# Patient Record
Sex: Female | Born: 1965
Health system: Southern US, Community
[De-identification: ages and names within clinical notes are randomized; demographics above are authoritative.]

## PROBLEM LIST (undated history)

## (undated) DIAGNOSIS — M549 Dorsalgia, unspecified: Secondary | ICD-10-CM

## (undated) DIAGNOSIS — E739 Lactose intolerance, unspecified: Secondary | ICD-10-CM

## (undated) DIAGNOSIS — F329 Major depressive disorder, single episode, unspecified: Secondary | ICD-10-CM

## (undated) DIAGNOSIS — F32A Depression, unspecified: Secondary | ICD-10-CM

## (undated) DIAGNOSIS — K219 Gastro-esophageal reflux disease without esophagitis: Secondary | ICD-10-CM

## (undated) DIAGNOSIS — R5383 Other fatigue: Secondary | ICD-10-CM

## (undated) DIAGNOSIS — F419 Anxiety disorder, unspecified: Secondary | ICD-10-CM

## (undated) HISTORY — DX: Anxiety disorder, unspecified: F41.9

## (undated) HISTORY — DX: Lactose intolerance, unspecified: E73.9

## (undated) HISTORY — DX: Dorsalgia, unspecified: M54.9

## (undated) HISTORY — DX: Major depressive disorder, single episode, unspecified: F32.9

## (undated) HISTORY — DX: Other fatigue: R53.83

## (undated) HISTORY — DX: Depression, unspecified: F32.A

---

## 2002-07-10 ENCOUNTER — Other Ambulatory Visit: Admission: RE | Admit: 2002-07-10 | Discharge: 2002-07-10 | Payer: Self-pay | Admitting: Obstetrics & Gynecology

## 2002-07-11 ENCOUNTER — Other Ambulatory Visit: Admission: RE | Admit: 2002-07-11 | Discharge: 2002-07-11 | Payer: Self-pay | Admitting: Obstetrics & Gynecology

## 2002-07-31 ENCOUNTER — Ambulatory Visit: Admission: AD | Admit: 2002-07-31 | Discharge: 2002-07-31 | Payer: Self-pay | Admitting: Obstetrics and Gynecology

## 2002-07-31 ENCOUNTER — Encounter (INDEPENDENT_AMBULATORY_CARE_PROVIDER_SITE_OTHER): Payer: Self-pay | Admitting: Specialist

## 2003-03-31 HISTORY — PX: DILATION AND CURETTAGE OF UTERUS: SHX78

## 2003-10-22 ENCOUNTER — Other Ambulatory Visit: Admission: RE | Admit: 2003-10-22 | Discharge: 2003-10-22 | Payer: Self-pay | Admitting: Obstetrics and Gynecology

## 2005-03-06 ENCOUNTER — Other Ambulatory Visit: Admission: RE | Admit: 2005-03-06 | Discharge: 2005-03-06 | Payer: Self-pay | Admitting: Obstetrics and Gynecology

## 2007-07-18 ENCOUNTER — Emergency Department (HOSPITAL_COMMUNITY): Admission: EM | Admit: 2007-07-18 | Discharge: 2007-07-19 | Payer: Self-pay | Admitting: Emergency Medicine

## 2010-08-15 NOTE — Op Note (Signed)
   NAME:  Theresa Mathews, Theresa Mathews NO.:  [id number]   MEDICAL RECORD NO.:  [id number]                   PATIENT TYPE:  AMB   LOCATION:  [facility]                                 FACILITY:  [facility]   PHYSICIAN:  Carrington Clamp, M.D.              DATE OF BIRTH:  Aug 28, 1965   DATE OF PROCEDURE:  07/31/2002  DATE OF DISCHARGE:                                 OPERATIVE REPORT   PREOPERATIVE DIAGNOSIS:  Incomplete abortion.   POSTOPERATIVE DIAGNOSIS:  Incomplete abortion.   PROCEDURE:  Suction dilatation and curettage.   ANESTHESIA:  General endotracheal anesthesia.   ESTIMATED BLOOD LOSS:  Minimal.   FLUIDS REPLACED:  750 mL.   URINE OUTPUT:  Not measured.   COMPLICATIONS:  None.   FINDINGS:  Some tissue was passed just before the procedure, and this was  also sent to pathology.  The cervix was open, but the uterus was about six  weeks' size before and after the procedure.  There was good cri at the end  of the procedure.   MEDICATIONS:  Methergine.   PATHOLOGY:  Uterine contents.   COUNTS:  Correct x3.   DESCRIPTION OF PROCEDURE:  After adequate general anesthesia was achieved,  the patient was prepped and draped in the usual sterile fashion in the  dorsal lithotomy position.  During the prep a small amount of tissue had  been passed from the cervix into the vagina, and this was collected and sent  to pathology.  Subsequently suction curettage was performed directly as the  cervix was opened about a centimeter, with scant return of tissue.  The  suction curettage was performed with a 9 mm curette.  Two passes were done,  followed by sharp curettage with good cri, followed by another suction  curettage.  All instruments were withdrawn from the vagina, and the patient  tolerated the procedure well.  Just to indicate, during the prep the bladder  had been drained with a red rubber catheter.                                               Carrington Clamp,  M.D.    MH/MEDQ  D:  07/31/2002  T:  08/01/2002  Job:  [id number]

## 2011-01-30 ENCOUNTER — Encounter: Payer: Self-pay | Admitting: Family Medicine

## 2011-01-30 ENCOUNTER — Ambulatory Visit (INDEPENDENT_AMBULATORY_CARE_PROVIDER_SITE_OTHER): Payer: BC Managed Care – PPO | Admitting: Family Medicine

## 2011-01-30 VITALS — BP 140/100 | HR 85 | Temp 98.7°F | Resp 18 | Ht 64.5 in | Wt 218.0 lb

## 2011-01-30 DIAGNOSIS — Z0000 Encounter for general adult medical examination without abnormal findings: Secondary | ICD-10-CM

## 2011-01-30 NOTE — Patient Instructions (Signed)

## 2011-01-30 NOTE — Progress Notes (Signed)
  Subjective:     Theresa Mathews is a 45 y.o. female and is here for a comprehensive physical exam. The patient reports no problems.  History   Social History  . Marital Status: Married    Spouse Name: N/A    Number of Children: N/A  . Years of Education: N/A   Occupational History  . Not on file.   Social History Main Topics  . Smoking status: Never Smoker   . Smokeless tobacco: Not on file  . Alcohol Use: Yes  . Drug Use: No  . Sexually Active: Not on file   Other Topics Concern  . Not on file   Social History Narrative  . No narrative on file   Health Maintenance  Topic Date Due  . Pap Smear  04/17/1984  . Tetanus/tdap  04/17/1985  . Influenza Vaccine  12/29/2010    The following portions of the patient's history were reviewed and updated as appropriate: allergies, current medications, past family history, past medical history, past social history, past surgical history and problem list.  Review of Systems Review of Systems  Constitutional: Negative for activity change, appetite change and fatigue.  HENT: Negative for hearing loss, congestion, tinnitus and ear discharge.  dentist q53m Eyes: Negative for visual disturbance (see optho q1y -- vision corrected to 20/20 with glasses).  Respiratory: Negative for cough, chest tightness and shortness of breath.   Cardiovascular: Negative for chest pain, palpitations and leg swelling.  Gastrointestinal: Negative for abdominal pain, diarrhea, constipation and abdominal distention.  Genitourinary: Negative for urgency, frequency, decreased urine volume and difficulty urinating.  Musculoskeletal: Negative for back pain, arthralgias and gait problem.  Skin: Negative for color change, pallor and rash.  Neurological: Negative for dizziness, light-headedness, numbness and headaches.  Hematological: Negative for adenopathy. Does not bruise/bleed easily.  Psychiatric/Behavioral: Negative for suicidal ideas, confusion, sleep  disturbance, self-injury, dysphoric mood, decreased concentration and agitation.  Derm--      Objective:    BP 140/100  Pulse 85  Temp(Src) 98.7 F (37.1 C) (Oral)  Resp 18  Ht 5' 4.5" (1.638 m)  Wt 218 lb (98.884 kg)  BMI 36.84 kg/m2  SpO2 95% General appearance: alert, cooperative, appears stated age and no distress Head: Normocephalic, without obvious abnormality, atraumatic Eyes: conjunctivae/corneas clear. PERRL, EOM's intact. Fundi benign. Ears: normal TM's and external ear canals both ears Nose: Nares normal. Septum midline. Mucosa normal. No drainage or sinus tenderness. Throat: lips, mucosa, and tongue normal; teeth and gums normal Neck: no adenopathy, no carotid bruit, no JVD, supple, symmetrical, trachea midline and thyroid not enlarged, symmetric, no tenderness/mass/nodules Back: symmetric, no curvature. ROM normal. No CVA tenderness. Lungs: clear to auscultation bilaterally Breasts: gyn Heart: regular rate and rhythm, S1, S2 normal, no murmur, click, rub or gallop Abdomen: soft, non-tender; bowel sounds normal; no masses,  no organomegaly Pelvic: gyn Extremities: extremities normal, atraumatic, no cyanosis or edema Pulses: 2+ and symmetric Skin: Skin color, texture, turgor normal. No rashes or lesions Lymph nodes: Cervical, supraclavicular, and axillary nodes normal. Neurologic: Alert and oriented X 3, normal strength and tone. Normal symmetric reflexes. Normal coordination and gait psych--no depression, no anxiety    Assessment:    Healthy female exam. obesity   Plan:    ghm utd D/w pt diet and exercise See After Visit Summary for Counseling Recommendations

## 2011-02-05 ENCOUNTER — Other Ambulatory Visit (INDEPENDENT_AMBULATORY_CARE_PROVIDER_SITE_OTHER): Payer: BC Managed Care – PPO

## 2011-02-05 DIAGNOSIS — Z0000 Encounter for general adult medical examination without abnormal findings: Secondary | ICD-10-CM

## 2011-02-05 LAB — CBC WITH DIFFERENTIAL/PLATELET
Basophils Relative: 0.4 % (ref 0.0–3.0)
Eosinophils Absolute: 0.2 10*3/uL (ref 0.0–0.7)
Eosinophils Relative: 2.7 % (ref 0.0–5.0)
Lymphocytes Relative: 29.1 % (ref 12.0–46.0)
MCHC: 33.8 g/dL (ref 30.0–36.0)
MCV: 92.9 fl (ref 78.0–100.0)
Monocytes Absolute: 0.5 10*3/uL (ref 0.1–1.0)
Neutrophils Relative %: 62.7 % (ref 43.0–77.0)
Platelets: 317 10*3/uL (ref 150.0–400.0)
RBC: 4.27 Mil/uL (ref 3.87–5.11)
WBC: 8.9 10*3/uL (ref 4.5–10.5)

## 2011-02-05 LAB — BASIC METABOLIC PANEL
BUN: 13 mg/dL (ref 6–23)
Calcium: 9 mg/dL (ref 8.4–10.5)
Creatinine, Ser: 0.8 mg/dL (ref 0.4–1.2)

## 2011-02-05 LAB — HEPATIC FUNCTION PANEL
AST: 20 U/L (ref 0–37)
Albumin: 4.2 g/dL (ref 3.5–5.2)
Alkaline Phosphatase: 81 U/L (ref 39–117)
Bilirubin, Direct: 0 mg/dL (ref 0.0–0.3)
Total Protein: 7.6 g/dL (ref 6.0–8.3)

## 2011-02-05 LAB — LIPID PANEL
Cholesterol: 185 mg/dL (ref 0–200)
Triglycerides: 135 mg/dL (ref 0.0–149.0)

## 2011-02-05 LAB — TSH: TSH: 1.27 u[IU]/mL (ref 0.35–5.50)

## 2011-02-05 NOTE — Progress Notes (Signed)
12  

## 2011-12-08 ENCOUNTER — Other Ambulatory Visit: Payer: Self-pay | Admitting: Obstetrics and Gynecology

## 2012-06-28 ENCOUNTER — Other Ambulatory Visit: Payer: Self-pay | Admitting: Dermatology

## 2012-12-01 ENCOUNTER — Ambulatory Visit (INDEPENDENT_AMBULATORY_CARE_PROVIDER_SITE_OTHER): Payer: BC Managed Care – PPO | Admitting: Family Medicine

## 2012-12-01 ENCOUNTER — Encounter: Payer: Self-pay | Admitting: Family Medicine

## 2012-12-01 VITALS — BP 116/80 | HR 81 | Temp 98.9°F | Wt 214.6 lb

## 2012-12-01 DIAGNOSIS — M545 Low back pain, unspecified: Secondary | ICD-10-CM | POA: Insufficient documentation

## 2012-12-01 DIAGNOSIS — M549 Dorsalgia, unspecified: Secondary | ICD-10-CM

## 2012-12-01 MED ORDER — CYCLOBENZAPRINE HCL 10 MG PO TABS: 10.0000 mg | ORAL_TABLET | Freq: Three times a day (TID) | ORAL | Status: DC | PRN

## 2012-12-01 MED ORDER — MELOXICAM 15 MG PO TABS: 15.0000 mg | ORAL_TABLET | Freq: Every day | ORAL | Status: DC

## 2012-12-01 NOTE — Progress Notes (Signed)
  Subjective:     Theresa Mathews is a 47 y.o. female who presents for evaluation of neck pain. Event that precipitated these symptoms: thinks she just slept funny. Onset of symptoms was 2 weeks ago, and have been gradually improving since that time. Current symptoms are stiffness in L side neck  and pain in low back with no radiation. Patient denies weakness in arms or legs. Patient has had no prior neck problems or back problems.   Previous treatments: medication: NSAID: Ibuprofen.  The following portions of the patient's history were reviewed and updated as appropriate: allergies, current medications, past family history, past medical history, past social history, past surgical history and problem list.  Review of Systems Pertinent items are noted in HPI.    Objective:    BP 116/80  Pulse 81  Temp(Src) 98.9 F (37.2 C) (Oral)  Wt 214 lb 9.6 oz (97.342 kg)  BMI 36.28 kg/m2  SpO2 97% General:   alert, cooperative, appears stated age and no distress  External Deformity:  absent  ROM Cervical Spine:  left rotation to 45 degrees,  Rest of rom normal  Midline Tenderness:  absent -  Paraspinous tenderness:  moderate on the left  UE Neurologic Exam ::  unremarkable, normal strength, normal sensation, normal reflexes       Back---dtr = and b/l  ,  SLR normal,  from    X-ray of the cervical spine: Not indicated    Assessment:    Cervical strain    Plan:    Discussed the cervical pain, its course and treatment. Discussed appropriate exercises. Discussed appropriate use of ice and heat. NSAIDs per medication orders. Muscle relaxants started per medication orders. Suggested chiropractic. f/u prn

## 2012-12-01 NOTE — Assessment & Plan Note (Signed)
Exercise Muscle relaxer mobic Ice/ heat Consider chiropractor

## 2012-12-01 NOTE — Patient Instructions (Addendum)
Lumbosacral Strain Lumbosacral strain is one of the most common causes of back pain. There are many causes of back pain. Most are not serious conditions. CAUSES  Your backbone (spinal column) is made up of 24 main vertebral bodies, the sacrum, and the coccyx. These are held together by muscles and tough, fibrous tissue (ligaments). Nerve roots pass through the openings between the vertebrae. A sudden move or injury to the back may cause injury to, or pressure on, these nerves. This may result in localized back pain or pain movement (radiation) into the buttocks, down the leg, and into the foot. Sharp, shooting pain from the buttock down the back of the leg (sciatica) is frequently associated with a ruptured (herniated) disk. Pain may be caused by muscle spasm alone. Your caregiver can often find the cause of your pain by the details of your symptoms and an exam. In some cases, you may need tests (such as X-rays). Your caregiver will work with you to decide if any tests are needed based on your specific exam. HOME CARE INSTRUCTIONS   Avoid an underactive lifestyle. Active exercise, as directed by your caregiver, is your greatest weapon against back pain.  Avoid hard physical activities (tennis, racquetball, waterskiing) if you are not in proper physical condition for it. This may aggravate or create problems.  If you have a back problem, avoid sports requiring sudden body movements. Swimming and walking are generally safer activities.  Maintain good posture.  Avoid becoming overweight (obese).  Use bed rest for only the most extreme, sudden (acute) episode. Your caregiver will help you determine how much bed rest is necessary.  For acute conditions, you may put ice on the injured area.  Put ice in a plastic bag.  Place a towel between your skin and the bag.  Leave the ice on for 15-20 minutes at a time, every 2 hours, or as needed.  After you are improved and more active, it may help to  apply heat for 30 minutes before activities. See your caregiver if you are having pain that lasts longer than expected. Your caregiver can advise appropriate exercises or therapy if needed. With conditioning, most back problems can be avoided. SEEK IMMEDIATE MEDICAL CARE IF:   You have numbness, tingling, weakness, or problems with the use of your arms or legs.  You experience severe back pain not relieved with medicines.  There is a change in bowel or bladder control.  You have increasing pain in any area of the body, including your belly (abdomen).  You notice shortness of breath, dizziness, or feel faint.  You feel sick to your stomach (nauseous), are throwing up (vomiting), or become sweaty.  You notice discoloration of your toes or legs, or your feet get very cold.  Your back pain is getting worse.  You have a fever. MAKE SURE YOU:   Understand these instructions.  Will watch your condition.  Will get help right away if you are not doing well or get worse. Document Released: 12/24/2004 Document Revised: 06/08/2011 Document Reviewed: 06/15/2008 Exit[employer] Patient Information 2014 [employer]. Cervical Sprain A cervical sprain is an injury in the neck in which the ligaments are stretched or torn. The ligaments are the tissues that hold the bones of the neck (vertebrae) in place.Cervical sprains can range from very mild to very severe. Most cervical sprains get better in 1 to 3 weeks, but it depends on the cause and extent of the injury. Severe cervical sprains can cause the neck vertebrae   to be unstable. This can lead to damage of the spinal cord and can result in serious nervous system problems. Your caregiver will determine whether your cervical sprain is mild or severe. CAUSES  Severe cervical sprains may be caused by:  Contact sport injuries (football, rugby, wrestling, hockey, auto racing, gymnastics, diving, martial arts, boxing).  Motor vehicle  collisions.  Whiplash injuries. This means the neck is forcefully whipped backward and forward.  Falls. Mild cervical sprains may be caused by:   Awkward positions, such as cradling a telephone between your ear and shoulder.  Sitting in a chair that does not offer proper support.  Working at a poorly designed computer station.  Activities that require looking up or down for long periods of time. SYMPTOMS   Pain, soreness, stiffness, or a burning sensation in the front, back, or sides of the neck. This discomfort may develop immediately after injury or it may develop slowly and not begin for 24 hours or more after an injury.  Pain or tenderness directly in the middle of the back of the neck.  Shoulder or upper back pain.  Limited ability to move the neck.  Headache.  Dizziness.  Weakness, numbness, or tingling in the hands or arms.  Muscle spasms.  Difficulty swallowing or chewing.  Tenderness and swelling of the neck. DIAGNOSIS  Most of the time, your caregiver can diagnose this problem by taking your history and doing a physical exam. Your caregiver will ask about any known problems, such as arthritis in the neck or a previous neck injury. X-rays may be taken to find out if there are any other problems, such as problems with the bones of the neck. However, an X-ray often does not reveal the full extent of a cervical sprain. Other tests such as a computed tomography (CT) scan or magnetic resonance imaging (MRI) may be needed. TREATMENT  Treatment depends on the severity of the cervical sprain. Mild sprains can be treated with rest, keeping the neck in place (immobilization), and pain medicines. Severe cervical sprains need immediate immobilization and an appointment with an orthopedist or neurosurgeon. Several treatment options are available to help with pain, muscle spasms, and other symptoms. Your caregiver may prescribe:  Medicines, such as pain relievers, numbing  medicines, or muscle relaxants.  Physical therapy. This can include stretching exercises, strengthening exercises, and posture training. Exercises and improved posture can help stabilize the neck, strengthen muscles, and help stop symptoms from returning.  A neck collar to be worn for short periods of time. Often, these collars are worn for comfort. However, certain collars may be worn to protect the neck and prevent further worsening of a serious cervical sprain. HOME CARE INSTRUCTIONS   Put ice on the injured area.  Put ice in a plastic bag.  Place a towel between your skin and the bag.  Leave the ice on for 15-20 minutes, 3-4 times a day.  Only take over-the-counter or prescription medicines for pain, discomfort, or fever as directed by your caregiver.  Keep all follow-up appointments as directed by your caregiver.  Keep all physical therapy appointments as directed by your caregiver.  If a neck collar is prescribed, wear it as directed by your caregiver.  Do not drive while wearing a neck collar.  Make any needed adjustments to your work station to promote good posture.  Avoid positions and activities that make your symptoms worse.  Warm up and stretch before being active to help prevent problems. SEEK MEDICAL CARE IF:     Your pain is not controlled with medicine.  You are unable to decrease your pain medicine over time as planned.  Your activity level is not improving as expected. SEEK IMMEDIATE MEDICAL CARE IF:   You develop any bleeding, stomach upset, or signs of an allergic reaction to your medicine.  Your symptoms get worse.  You develop new, unexplained symptoms.  You have numbness, tingling, weakness, or paralysis in any part of your body. MAKE SURE YOU:   Understand these instructions.  Will watch your condition.  Will get help right away if you are not doing well or get worse. Document Released: 01/11/2007 Document Revised: 06/08/2011 Document  Reviewed: 12/17/2010 [employer] Patient Information 2014 [employer].  

## 2013-06-28 ENCOUNTER — Other Ambulatory Visit: Payer: Self-pay | Admitting: Obstetrics and Gynecology

## 2014-08-09 ENCOUNTER — Other Ambulatory Visit: Payer: Self-pay | Admitting: Obstetrics and Gynecology

## 2014-08-09 LAB — HM MAMMOGRAPHY

## 2014-08-09 LAB — HM PAP SMEAR: HM Pap smear: NORMAL

## 2014-08-10 LAB — CYTOLOGY - PAP

## 2014-09-14 ENCOUNTER — Encounter: Payer: Self-pay | Admitting: General Practice

## 2014-10-18 ENCOUNTER — Other Ambulatory Visit: Payer: Self-pay | Admitting: Obstetrics and Gynecology

## 2014-11-13 ENCOUNTER — Encounter: Payer: Self-pay | Admitting: Family Medicine

## 2015-01-08 ENCOUNTER — Encounter (HOSPITAL_COMMUNITY): Payer: Self-pay

## 2015-01-08 ENCOUNTER — Encounter (HOSPITAL_COMMUNITY)
Admission: RE | Admit: 2015-01-08 | Discharge: 2015-01-08 | Disposition: A | Discharge status: Home / Self Care | Payer: BLUE CROSS/BLUE SHIELD | Source: Ambulatory Visit | Attending: Obstetrics and Gynecology | Admitting: Obstetrics and Gynecology

## 2015-01-08 DIAGNOSIS — Z01818 Encounter for other preprocedural examination: Secondary | ICD-10-CM | POA: Diagnosis not present

## 2015-01-08 DIAGNOSIS — R938 Abnormal findings on diagnostic imaging of other specified body structures: Secondary | ICD-10-CM | POA: Diagnosis not present

## 2015-01-08 DIAGNOSIS — N393 Stress incontinence (female) (male): Secondary | ICD-10-CM | POA: Insufficient documentation

## 2015-01-08 HISTORY — DX: Gastro-esophageal reflux disease without esophagitis: K21.9

## 2015-01-08 LAB — CBC
HCT: 39.7 % (ref 36.0–46.0)
HEMOGLOBIN: 13.6 g/dL (ref 12.0–15.0)
MCH: 31 pg (ref 26.0–34.0)
MCHC: 34.3 g/dL (ref 30.0–36.0)
MCV: 90.4 fL (ref 78.0–100.0)
PLATELETS: 391 10*3/uL (ref 150–400)
RBC: 4.39 MIL/uL (ref 3.87–5.11)
RDW: 13.4 % (ref 11.5–15.5)
WBC: 9.5 10*3/uL (ref 4.0–10.5)

## 2015-01-08 NOTE — Patient Instructions (Addendum)
   Your procedure is scheduled on: OCT 26 AT 730AM  Enter through the Main Entrance of [facility] at: 6AM  Pick up the phone at the desk and dial [phone] and inform us of your arrival.  Please call this number if you have any problems the morning of surgery: [phone]  DO NOT EAT OR DRINK AFTER MIDNIGHT OCT 25   Take these medicines the morning of surgery with a SIP OF WATER:  TAKE ZANTAC NIGHT BEFORE SURGERY  Do not wear jewelry, make-up, or FINGER nail polish No metal in your hair or on your body. Do not wear lotions, powders, perfumes.  You may wear deodorant.  Do not bring valuables to the hospital. Contacts, dentures or bridgework may not be worn into surgery.  Leave suitcase in the car. After Surgery it may be brought to your room. For patients being admitted to the hospital, checkout time is 11:00am the day of discharge.

## 2015-01-10 ENCOUNTER — Other Ambulatory Visit: Payer: Self-pay | Admitting: Obstetrics and Gynecology

## 2015-01-22 ENCOUNTER — Encounter (HOSPITAL_COMMUNITY): Payer: Self-pay | Admitting: Anesthesiology

## 2015-01-22 MED ORDER — DEXTROSE 5 % IV SOLN
3.0000 g | INTRAVENOUS | Status: AC
  Administered 2015-01-23: 3 g via INTRAVENOUS
  Filled 2015-01-22: qty 3000

## 2015-01-22 NOTE — H&P (Signed)
49 y.o. yo complains of SUI severe with cystocele and suspended uterus. Doing TLH for thickened endometrium, menorrhagia/dysmenorrhea and family hx of ovarian cancer. Pt ok with HRT and menopause with removal of ovaries. No splinting or bm getting caught.SUI is very significant and desires to have out. No SA secondary husband has MS. EM increased from 1.3 to 2 cm in last month. No problems with periods except slightly more cramping.  Past Medical History  Diagnosis Date  . Depression   . GERD (gastroesophageal reflux disease)    Past Surgical History  Procedure Laterality Date  . Dilation and curettage of uterus      Social History   Social History  . Marital Status: Married    Spouse Name: N/A  . Number of Children: N/A  . Years of Education: N/A   Occupational History  . [employer]    Social History Main Topics  . Smoking status: Never Smoker   . Smokeless tobacco: Not on file  . Alcohol Use: 1.2 oz/week    2 Glasses of wine per week     Comment: 2-3  . Drug Use: No  . Sexual Activity:    Partners: Male   Other Topics Concern  . Not on file   Social History Narrative   Exercise--no    No current facility-administered medications on file prior to encounter.   Current Outpatient Prescriptions on File Prior to Encounter  Medication Sig Dispense Refill  . citalopram (CELEXA) 10 MG tablet Take 1 tablet by mouth daily.      No Known Allergies  @VITALS2 @  Lungs: clear to ascultation Cor:  RRR Abdomen:  soft, nontender, nondistended. Ex:  no  Pelvic: Vulva: no masses, atrophy, or lesions. Bladder/Urethra: no urethral discharge or mass and normal meatus and bladder non distended; urethra >>45 degrees, leaking with valsalva. Vagina no tenderness, erythema, rectocele, abnormal vaginal discharge, or vesicle(s) or ulcers and cystocele (to 0). Cervix: no discharge or cervical motion tenderness and grossly normal. Uterus: normal shape and size (7, normal size  shape and scant decensus) and midline, mobile, non-tender, and no uterine prolapse. Adnexa/Parametria: no parametrial tenderness or mass and no adnexal tenderness or ovarian mass.cords, erythema    A:  Severe SUI with >>45 degree urethra.  Pt has thickened endometrium (about 2 cm) with a normal EMB.  Pt also has family hx of ovarian cancer.  After discussing with pt all options she strongly desires to have a TLH/BSO with TVT/A repair and cystoscopy.   P: All risks, benefits and alternatives d/w patient and she desires to proceed.  Patient has undergone a modified bowel prep and will receive preop antibiotics and SCDs during the operation.     Roy Tokarz A

## 2015-01-22 NOTE — Anesthesia Preprocedure Evaluation (Addendum)
Anesthesia Evaluation  Patient identified by MRN, date of birth, ID band Patient awake    Reviewed: Allergy & Precautions, NPO status , Patient's Chart, lab work & pertinent test results  Airway Mallampati: II  TM Distance: >3 FB Neck ROM: Full    Dental  (+) Teeth Intact, Caps   Pulmonary neg pulmonary ROS,    Pulmonary exam normal breath sounds clear to auscultation       Cardiovascular Exercise Tolerance: Good negative cardio ROS Normal cardiovascular exam Rhythm:Regular Rate:Normal     Neuro/Psych PSYCHIATRIC DISORDERS Depression negative neurological ROS     GI/Hepatic Neg liver ROS, GERD  Medicated and Controlled,  Endo/Other  Morbid obesity  Renal/GU negative Renal ROS Bladder dysfunction  SUI    Musculoskeletal negative musculoskeletal ROS (+)   Abdominal (+) + obese,   Peds  Hematology   Anesthesia Other Findings   Reproductive/Obstetrics Thickened endometrium                            Anesthesia Physical Anesthesia Plan  ASA: III  Anesthesia Plan: General   Post-op Pain Management:    Induction: Intravenous  Airway Management Planned: Oral ETT  Additional Equipment:   Intra-op Plan:   Post-operative Plan: Extubation in OR  Informed Consent: I have reviewed the patients History and Physical, chart, labs and discussed the procedure including the risks, benefits and alternatives for the proposed anesthesia with the patient or authorized representative who has indicated his/her understanding and acceptance.   Dental advisory given  Plan Discussed with: CRNA, Anesthesiologist and Surgeon  Anesthesia Plan Comments:         Anesthesia Quick Evaluation

## 2015-01-23 ENCOUNTER — Encounter (HOSPITAL_COMMUNITY): Payer: Self-pay | Admitting: Certified Registered Nurse Anesthetist

## 2015-01-23 ENCOUNTER — Encounter (HOSPITAL_COMMUNITY)
Admission: RE | Disposition: A | Discharge status: Home / Self Care | Payer: Self-pay | Source: Ambulatory Visit | Attending: Obstetrics and Gynecology

## 2015-01-23 ENCOUNTER — Ambulatory Visit (HOSPITAL_COMMUNITY): Payer: BLUE CROSS/BLUE SHIELD | Admitting: Anesthesiology

## 2015-01-23 ENCOUNTER — Ambulatory Visit (HOSPITAL_COMMUNITY)
Admission: RE | Admit: 2015-01-23 | Discharge: 2015-01-24 | Disposition: A | Discharge status: Home / Self Care | Payer: BLUE CROSS/BLUE SHIELD | Source: Ambulatory Visit | Attending: Obstetrics and Gynecology | Admitting: Obstetrics and Gynecology

## 2015-01-23 DIAGNOSIS — N393 Stress incontinence (female) (male): Secondary | ICD-10-CM | POA: Insufficient documentation

## 2015-01-23 DIAGNOSIS — Z9889 Other specified postprocedural states: Secondary | ICD-10-CM

## 2015-01-23 DIAGNOSIS — Z6841 Body Mass Index (BMI) 40.0 and over, adult: Secondary | ICD-10-CM | POA: Diagnosis not present

## 2015-01-23 DIAGNOSIS — N814 Uterovaginal prolapse, unspecified: Secondary | ICD-10-CM | POA: Diagnosis not present

## 2015-01-23 DIAGNOSIS — N920 Excessive and frequent menstruation with regular cycle: Secondary | ICD-10-CM | POA: Diagnosis not present

## 2015-01-23 DIAGNOSIS — K219 Gastro-esophageal reflux disease without esophagitis: Secondary | ICD-10-CM | POA: Insufficient documentation

## 2015-01-23 DIAGNOSIS — Z8041 Family history of malignant neoplasm of ovary: Secondary | ICD-10-CM | POA: Insufficient documentation

## 2015-01-23 DIAGNOSIS — D259 Leiomyoma of uterus, unspecified: Secondary | ICD-10-CM | POA: Diagnosis present

## 2015-01-23 DIAGNOSIS — R938 Abnormal findings on diagnostic imaging of other specified body structures: Secondary | ICD-10-CM | POA: Diagnosis not present

## 2015-01-23 DIAGNOSIS — N946 Dysmenorrhea, unspecified: Secondary | ICD-10-CM | POA: Insufficient documentation

## 2015-01-23 HISTORY — PX: CYSTOSCOPY: SHX5120

## 2015-01-23 HISTORY — PX: ROBOTIC ASSISTED TOTAL HYSTERECTOMY WITH BILATERAL SALPINGO OOPHERECTOMY: SHX6086

## 2015-01-23 HISTORY — PX: BLADDER SUSPENSION: SHX72

## 2015-01-23 LAB — PREGNANCY, URINE: PREG TEST UR: NEGATIVE

## 2015-01-23 SURGERY — ROBOTIC ASSISTED TOTAL HYSTERECTOMY WITH BILATERAL SALPINGO OOPHORECTOMY
Anesthesia: General | Site: Vagina

## 2015-01-23 MED ORDER — MIDAZOLAM HCL 2 MG/2ML IJ SOLN
INTRAMUSCULAR | Status: DC | PRN
  Administered 2015-01-23: 2 mg via INTRAVENOUS

## 2015-01-23 MED ORDER — SCOPOLAMINE 1 MG/3DAYS TD PT72
1.0000 | MEDICATED_PATCH | Freq: Once | TRANSDERMAL | Status: DC
  Administered 2015-01-23: 1.5 mg via TRANSDERMAL

## 2015-01-23 MED ORDER — ESTRADIOL 0.1 MG/GM VA CREA
TOPICAL_CREAM | VAGINAL | Status: AC
  Filled 2015-01-23: qty 42.5

## 2015-01-23 MED ORDER — ARTIFICIAL TEARS OP OINT
TOPICAL_OINTMENT | OPHTHALMIC | Status: AC
  Filled 2015-01-23: qty 3.5

## 2015-01-23 MED ORDER — KETOROLAC TROMETHAMINE 30 MG/ML IJ SOLN
INTRAMUSCULAR | Status: DC | PRN
  Administered 2015-01-23: 30 mg via INTRAVENOUS

## 2015-01-23 MED ORDER — FENTANYL CITRATE (PF) 250 MCG/5ML IJ SOLN
INTRAMUSCULAR | Status: AC
  Filled 2015-01-23: qty 25

## 2015-01-23 MED ORDER — DEXAMETHASONE SODIUM PHOSPHATE 10 MG/ML IJ SOLN
INTRAMUSCULAR | Status: DC | PRN
  Administered 2015-01-23: 4 mg via INTRAVENOUS

## 2015-01-23 MED ORDER — FENTANYL CITRATE (PF) 100 MCG/2ML IJ SOLN
INTRAMUSCULAR | Status: DC | PRN
  Administered 2015-01-23: 100 ug via INTRAVENOUS
  Administered 2015-01-23: 25 ug via INTRAVENOUS
  Administered 2015-01-23: 50 ug via INTRAVENOUS
  Administered 2015-01-23: 25 ug via INTRAVENOUS
  Administered 2015-01-23: 100 ug via INTRAVENOUS
  Administered 2015-01-23: 50 ug via INTRAVENOUS

## 2015-01-23 MED ORDER — KETOROLAC TROMETHAMINE 30 MG/ML IJ SOLN
INTRAMUSCULAR | Status: AC
  Filled 2015-01-23: qty 1

## 2015-01-23 MED ORDER — ONDANSETRON HCL 4 MG/2ML IJ SOLN: 4.0000 mg | Freq: Four times a day (QID) | INTRAMUSCULAR | Status: DC | PRN

## 2015-01-23 MED ORDER — MENTHOL 3 MG MT LOZG: 1.0000 | LOZENGE | OROMUCOSAL | Status: DC | PRN

## 2015-01-23 MED ORDER — LIDOCAINE-EPINEPHRINE 0.5 %-1:200000 IJ SOLN
INTRAMUSCULAR | Status: DC | PRN
  Administered 2015-01-23: 4 mL

## 2015-01-23 MED ORDER — SODIUM CHLORIDE 0.9 % IJ SOLN
INTRAMUSCULAR | Status: AC
  Filled 2015-01-23: qty 50

## 2015-01-23 MED ORDER — NEOSTIGMINE METHYLSULFATE 10 MG/10ML IV SOLN
INTRAVENOUS | Status: AC
  Filled 2015-01-23: qty 1

## 2015-01-23 MED ORDER — GLYCOPYRROLATE 0.2 MG/ML IJ SOLN
INTRAMUSCULAR | Status: AC
  Filled 2015-01-23: qty 3

## 2015-01-23 MED ORDER — PROPOFOL 10 MG/ML IV BOLUS
INTRAVENOUS | Status: DC | PRN
  Administered 2015-01-23: 180 mg via INTRAVENOUS

## 2015-01-23 MED ORDER — ROCURONIUM BROMIDE 100 MG/10ML IV SOLN
INTRAVENOUS | Status: AC
  Filled 2015-01-23: qty 1

## 2015-01-23 MED ORDER — HYDROMORPHONE HCL 1 MG/ML IJ SOLN
INTRAMUSCULAR | Status: AC
  Administered 2015-01-23: 0.5 mg via INTRAVENOUS
  Filled 2015-01-23: qty 1

## 2015-01-23 MED ORDER — FAMOTIDINE 20 MG PO TABS
20.0000 mg | ORAL_TABLET | Freq: Two times a day (BID) | ORAL | Status: DC
  Administered 2015-01-23 – 2015-01-24 (×2): 20 mg via ORAL
  Filled 2015-01-23 (×2): qty 1

## 2015-01-23 MED ORDER — PHENYLEPHRINE HCL 10 MG/ML IJ SOLN
INTRAMUSCULAR | Status: DC | PRN
  Administered 2015-01-23: 40 ug via INTRAVENOUS

## 2015-01-23 MED ORDER — METOCLOPRAMIDE HCL 5 MG/ML IJ SOLN: 10.0000 mg | Freq: Once | INTRAMUSCULAR | Status: DC | PRN

## 2015-01-23 MED ORDER — ROPIVACAINE HCL 5 MG/ML IJ SOLN
INTRAMUSCULAR | Status: DC | PRN
  Administered 2015-01-23: 60 mL

## 2015-01-23 MED ORDER — NEOSTIGMINE METHYLSULFATE 10 MG/10ML IV SOLN
INTRAVENOUS | Status: DC | PRN
  Administered 2015-01-23: 2 mg via INTRAVENOUS

## 2015-01-23 MED ORDER — CITALOPRAM HYDROBROMIDE 10 MG PO TABS
10.0000 mg | ORAL_TABLET | Freq: Every day | ORAL | Status: DC
  Administered 2015-01-24: 10 mg via ORAL
  Filled 2015-01-23 (×2): qty 1

## 2015-01-23 MED ORDER — STERILE WATER FOR IRRIGATION IR SOLN
Status: DC | PRN
  Administered 2015-01-23 (×2): 1000 mL via INTRAVESICAL

## 2015-01-23 MED ORDER — LACTATED RINGERS IV SOLN
INTRAVENOUS | Status: DC
  Administered 2015-01-23 (×2): via INTRAVENOUS

## 2015-01-23 MED ORDER — ONDANSETRON HCL 4 MG/2ML IJ SOLN
INTRAMUSCULAR | Status: DC | PRN
  Administered 2015-01-23: 4 mg via INTRAVENOUS

## 2015-01-23 MED ORDER — GLYCOPYRROLATE 0.2 MG/ML IJ SOLN
INTRAMUSCULAR | Status: DC | PRN
  Administered 2015-01-23: 0.3 mg via INTRAVENOUS

## 2015-01-23 MED ORDER — MEPERIDINE HCL 25 MG/ML IJ SOLN: 6.2500 mg | INTRAMUSCULAR | Status: DC | PRN

## 2015-01-23 MED ORDER — LIDOCAINE HCL (CARDIAC) 20 MG/ML IV SOLN
INTRAVENOUS | Status: DC | PRN
  Administered 2015-01-23: 80 mg via INTRAVENOUS

## 2015-01-23 MED ORDER — LIDOCAINE HCL (CARDIAC) 20 MG/ML IV SOLN
INTRAVENOUS | Status: AC
  Filled 2015-01-23: qty 5

## 2015-01-23 MED ORDER — METHYLENE BLUE 1 % INJ SOLN
INTRAMUSCULAR | Status: AC
  Filled 2015-01-23: qty 1

## 2015-01-23 MED ORDER — ROCURONIUM BROMIDE 100 MG/10ML IV SOLN
INTRAVENOUS | Status: DC | PRN
  Administered 2015-01-23: 60 mg via INTRAVENOUS
  Administered 2015-01-23: 10 mg via INTRAVENOUS

## 2015-01-23 MED ORDER — HYDROMORPHONE HCL 1 MG/ML IJ SOLN
0.2500 mg | INTRAMUSCULAR | Status: DC | PRN
  Administered 2015-01-23 (×2): 0.5 mg via INTRAVENOUS

## 2015-01-23 MED ORDER — ONDANSETRON HCL 4 MG PO TABS: 4.0000 mg | ORAL_TABLET | Freq: Four times a day (QID) | ORAL | Status: DC | PRN

## 2015-01-23 MED ORDER — LACTATED RINGERS IR SOLN
Status: DC | PRN
Start: 2015-01-23 — End: 2015-01-23
  Administered 2015-01-23: 3000 mL

## 2015-01-23 MED ORDER — SCOPOLAMINE 1 MG/3DAYS TD PT72
MEDICATED_PATCH | TRANSDERMAL | Status: AC
  Administered 2015-01-23: 1.5 mg via TRANSDERMAL
  Filled 2015-01-23: qty 1

## 2015-01-23 MED ORDER — OXYCODONE-ACETAMINOPHEN 5-325 MG PO TABS
1.0000 | ORAL_TABLET | ORAL | Status: DC | PRN
  Administered 2015-01-23 – 2015-01-24 (×2): 1 via ORAL
  Filled 2015-01-23 (×2): qty 1

## 2015-01-23 MED ORDER — ARTIFICIAL TEARS OP OINT
TOPICAL_OINTMENT | OPHTHALMIC | Status: DC | PRN
  Administered 2015-01-23: 1 via OPHTHALMIC

## 2015-01-23 MED ORDER — KETOROLAC TROMETHAMINE 30 MG/ML IJ SOLN: 30.0000 mg | Freq: Four times a day (QID) | INTRAMUSCULAR | Status: DC

## 2015-01-23 MED ORDER — DEXAMETHASONE SODIUM PHOSPHATE 4 MG/ML IJ SOLN
INTRAMUSCULAR | Status: AC
  Filled 2015-01-23: qty 1

## 2015-01-23 MED ORDER — ROPIVACAINE HCL 5 MG/ML IJ SOLN
INTRAMUSCULAR | Status: AC
  Filled 2015-01-23: qty 30

## 2015-01-23 MED ORDER — LIDOCAINE-EPINEPHRINE 0.5 %-1:200000 IJ SOLN
INTRAMUSCULAR | Status: AC
Start: 2015-01-23 — End: 2015-01-23
  Filled 2015-01-23: qty 1

## 2015-01-23 MED ORDER — MIDAZOLAM HCL 2 MG/2ML IJ SOLN
INTRAMUSCULAR | Status: AC
  Filled 2015-01-23: qty 4

## 2015-01-23 MED ORDER — ONDANSETRON HCL 4 MG/2ML IJ SOLN
INTRAMUSCULAR | Status: AC
  Filled 2015-01-23: qty 2

## 2015-01-23 MED ORDER — IBUPROFEN 800 MG PO TABS
800.0000 mg | ORAL_TABLET | Freq: Three times a day (TID) | ORAL | Status: DC | PRN
  Administered 2015-01-23 – 2015-01-24 (×2): 800 mg via ORAL
  Filled 2015-01-23 (×2): qty 1

## 2015-01-23 MED ORDER — ESTRADIOL 0.1 MG/GM VA CREA
TOPICAL_CREAM | VAGINAL | Status: DC | PRN
  Administered 2015-01-23: 1 via VAGINAL

## 2015-01-23 MED ORDER — LIDOCAINE-EPINEPHRINE 0.5 %-1:200000 IJ SOLN
INTRAMUSCULAR | Status: AC
  Filled 2015-01-23: qty 1

## 2015-01-23 MED ORDER — PROPOFOL 10 MG/ML IV BOLUS
INTRAVENOUS | Status: AC
  Filled 2015-01-23: qty 20

## 2015-01-23 SURGICAL SUPPLY — 93 items
APL SKNCLS STERI-STRIP NONHPOA (GAUZE/BANDAGES/DRESSINGS)
BARRIER ADHS 3X4 INTERCEED (GAUZE/BANDAGES/DRESSINGS) ×5 IMPLANT
BENZOIN TINCTURE PRP APPL 2/3 (GAUZE/BANDAGES/DRESSINGS) ×3 IMPLANT
BLADE SURG 15 STRL LF C SS BP (BLADE) ×3 IMPLANT
BLADE SURG 15 STRL SS (BLADE) ×5
BRR ADH 4X3 ABS CNTRL BYND (GAUZE/BANDAGES/DRESSINGS) ×3
CANISTER SUCT 3000ML (MISCELLANEOUS) ×5 IMPLANT
CATH BONANNO SUPRAPUBIC 14G (CATHETERS) IMPLANT
CATH ROBINSON RED A/P 16FR (CATHETERS) ×5 IMPLANT
CLOSURE WOUND 1/2 X4 (GAUZE/BANDAGES/DRESSINGS) ×1
CLOTH BEACON ORANGE TIMEOUT ST (SAFETY) ×5 IMPLANT
CONT PATH 16OZ SNAP LID 3702 (MISCELLANEOUS) ×5 IMPLANT
COVER BACK TABLE 60X90IN (DRAPES) ×10 IMPLANT
COVER TIP SHEARS 8 DVNC (MISCELLANEOUS) ×3 IMPLANT
COVER TIP SHEARS 8MM DA VINCI (MISCELLANEOUS) ×2
DECANTER SPIKE VIAL GLASS SM (MISCELLANEOUS) ×7 IMPLANT
DEFOGGER SCOPE WARMER CLEARIFY (MISCELLANEOUS) ×2 IMPLANT
DRAPE WARM FLUID 44X44 (DRAPE) ×3 IMPLANT
DRSG COVADERM PLUS 2X2 (GAUZE/BANDAGES/DRESSINGS) ×20 IMPLANT
DRSG OPSITE POSTOP 3X4 (GAUZE/BANDAGES/DRESSINGS) ×5 IMPLANT
DURAPREP 26ML APPLICATOR (WOUND CARE) ×5 IMPLANT
ELECT REM PT RETURN 9FT ADLT (ELECTROSURGICAL) ×5
ELECTRODE REM PT RTRN 9FT ADLT (ELECTROSURGICAL) ×3 IMPLANT
GAUZE PACKING 1 X5 YD ST (GAUZE/BANDAGES/DRESSINGS) ×2 IMPLANT
GAUZE PACKING 2X5 YD STRL (GAUZE/BANDAGES/DRESSINGS) ×3 IMPLANT
GAUZE VASELINE 3X9 (GAUZE/BANDAGES/DRESSINGS) IMPLANT
GLOVE BIO SURGEON STRL SZ 6.5 (GLOVE) ×4 IMPLANT
GLOVE BIO SURGEON STRL SZ7 (GLOVE) ×10 IMPLANT
GLOVE BIO SURGEONS STRL SZ 6.5 (GLOVE) ×1
GLOVE BIOGEL PI IND STRL 6.5 (GLOVE) ×3 IMPLANT
GLOVE BIOGEL PI IND STRL 7.0 (GLOVE) ×6 IMPLANT
GLOVE BIOGEL PI INDICATOR 6.5 (GLOVE) ×2
GLOVE BIOGEL PI INDICATOR 7.0 (GLOVE) ×4
GLOVE ECLIPSE 6.5 STRL STRAW (GLOVE) ×15 IMPLANT
GOWN STRL REUS W/TWL LRG LVL3 (GOWN DISPOSABLE) ×10 IMPLANT
GYRUS RUMI II 2.5CM BLUE (DISPOSABLE)
GYRUS RUMI II 3.5CM BLUE (DISPOSABLE)
GYRUS RUMI II 4.0CM BLUE (DISPOSABLE)
KIT ACCESSORY DA VINCI DISP (KITS) ×2
KIT ACCESSORY DVNC DISP (KITS) ×3 IMPLANT
LEGGING LITHOTOMY PAIR STRL (DRAPES) ×5 IMPLANT
LIQUID BAND (GAUZE/BANDAGES/DRESSINGS) ×5 IMPLANT
MANIPULATOR UTERINE 4.5 ZUMI (MISCELLANEOUS) IMPLANT
NEEDLE HYPO 22GX1.5 SAFETY (NEEDLE) ×5 IMPLANT
NEEDLE INSUFFLATION 120MM (ENDOMECHANICALS) ×5 IMPLANT
NS IRRIG 1000ML POUR BTL (IV SOLUTION) ×5 IMPLANT
OCCLUDER COLPOPNEUMO (BALLOONS) ×2 IMPLANT
PACK ROBOT WH (CUSTOM PROCEDURE TRAY) ×5 IMPLANT
PACK ROBOTIC GOWN (GOWN DISPOSABLE) ×5 IMPLANT
PACK VAGINAL WOMENS (CUSTOM PROCEDURE TRAY) ×5 IMPLANT
PAD POSITIONING PINK XL (MISCELLANEOUS) ×5 IMPLANT
PAD PREP 24X48 CUFFED NSTRL (MISCELLANEOUS) ×10 IMPLANT
PLUG CATH AND CAP STER (CATHETERS) ×5 IMPLANT
RUMI II 3.0CM BLUE KOH-EFFICIE (DISPOSABLE) ×2 IMPLANT
RUMI II GYRUS 2.5CM BLUE (DISPOSABLE) IMPLANT
RUMI II GYRUS 3.5CM BLUE (DISPOSABLE) IMPLANT
RUMI II GYRUS 4.0CM BLUE (DISPOSABLE) IMPLANT
SET CYSTO W/LG BORE CLAMP LF (SET/KITS/TRAYS/PACK) ×5 IMPLANT
SET IRRIG TUBING LAPAROSCOPIC (IRRIGATION / IRRIGATOR) ×5 IMPLANT
SET TRI-LUMEN FLTR TB AIRSEAL (TUBING) ×2 IMPLANT
SLING TRANS VAGINAL TAPE (Sling) ×2 IMPLANT
SLING UTERINE/ABD GYNECARE TVT (Sling) ×6 IMPLANT
STRIP CLOSURE SKIN 1/2X4 (GAUZE/BANDAGES/DRESSINGS) ×4 IMPLANT
SUT DVC VLOC 180 0 12IN GS21 (SUTURE)
SUT VIC AB 0 CT1 18XCR BRD8 (SUTURE) IMPLANT
SUT VIC AB 0 CT1 27 (SUTURE)
SUT VIC AB 0 CT1 27XBRD ANBCTR (SUTURE) IMPLANT
SUT VIC AB 0 CT1 8-18 (SUTURE) ×5
SUT VIC AB 2-0 CT2 27 (SUTURE) ×10 IMPLANT
SUT VIC AB 2-0 SH 27 (SUTURE)
SUT VIC AB 2-0 SH 27XBRD (SUTURE) IMPLANT
SUT VIC AB 2-0 UR6 27 (SUTURE) ×5 IMPLANT
SUT VICRYL RAPIDE 3 0 (SUTURE) ×10 IMPLANT
SUT VLOC 180 0 9IN  GS21 (SUTURE) ×2
SUT VLOC 180 0 9IN GS21 (SUTURE) IMPLANT
SUTURE DVC VLC 180 0 12IN GS21 (SUTURE) IMPLANT
SYR 50ML LL SCALE MARK (SYRINGE) ×7 IMPLANT
SYR BULB IRRIGATION 50ML (SYRINGE) ×2 IMPLANT
SYSTEM CONVERTIBLE TROCAR (TROCAR) IMPLANT
TIP RUMI ORANGE 6.7MMX12CM (TIP) IMPLANT
TIP UTERINE 5.1X6CM LAV DISP (MISCELLANEOUS) IMPLANT
TIP UTERINE 6.7X10CM GRN DISP (MISCELLANEOUS) IMPLANT
TIP UTERINE 6.7X6CM WHT DISP (MISCELLANEOUS) IMPLANT
TIP UTERINE 6.7X8CM BLUE DISP (MISCELLANEOUS) ×2 IMPLANT
TOWEL OR 17X24 6PK STRL BLUE (TOWEL DISPOSABLE) ×12 IMPLANT
TRAY FOLEY CATH SILVER 14FR (SET/KITS/TRAYS/PACK) ×5 IMPLANT
TROCAR DILATING TIP 12MM 150MM (ENDOMECHANICALS) ×5 IMPLANT
TROCAR DISP BLADELESS 8 DVNC (TROCAR) ×3 IMPLANT
TROCAR DISP BLADELESS 8MM (TROCAR) ×2
TROCAR PORT AIRSEAL 5X120 (TROCAR) IMPLANT
TROCAR XCEL 12X100 BLDLESS (ENDOMECHANICALS) IMPLANT
TROCAR XCEL NON-BLD 5MMX100MML (ENDOMECHANICALS) ×5 IMPLANT
WATER STERILE IRR 1000ML POUR (IV SOLUTION) ×9 IMPLANT

## 2015-01-23 NOTE — Transfer of Care (Signed)
Immediate Anesthesia Transfer of Care Note  Patient: Theresa Mathews  Procedure(s) Performed: Procedure(s): ROBOTIC ASSISTED TOTAL HYSTERECTOMY WITH BILATERAL SALPINGO OOPHORECTOMY (Bilateral) TRANSVAGINAL TAPE (TVT) PROCEDURE (N/A) CYSTOSCOPY (N/A)  Patient Location: PACU  Anesthesia Type:General  Level of Consciousness: awake, alert , oriented and patient cooperative  Airway & Oxygen Therapy: Patient Spontanous Breathing and Patient connected to nasal cannula oxygen  Post-op Assessment: Report given to RN and Post -op Vital signs reviewed and stable  Post vital signs: Reviewed and stable  Last Vitals:  Filed Vitals:   01/23/15 0607  BP: 145/89  Pulse: 81  Temp: 36.8 C  Resp: 18    Complications: No apparent anesthesia complications

## 2015-01-23 NOTE — Anesthesia Postprocedure Evaluation (Signed)
  Anesthesia Post-op Note  Patient: Theresa Mathews  Procedure(s) Performed: Procedure(s): ROBOTIC ASSISTED TOTAL HYSTERECTOMY WITH BILATERAL SALPINGO OOPHORECTOMY (Bilateral) TRANSVAGINAL TAPE (TVT) PROCEDURE (N/A) CYSTOSCOPY (N/A)  Patient Location: PACU  Anesthesia Type:General  Level of Consciousness: awake, alert  and oriented  Airway and Oxygen Therapy: Patient Spontanous Breathing  Post-op Pain: mild  Post-op Assessment: Post-op Vital signs reviewed, Patient's Cardiovascular Status Stable, Respiratory Function Stable, Patent Airway, No signs of Nausea or vomiting and Pain level controlled              Post-op Vital Signs: Reviewed and stable  Last Vitals:  Filed Vitals:   01/23/15 1130  BP: 136/83  Pulse: 76  Temp:   Resp: 18    Complications: No apparent anesthesia complications

## 2015-01-23 NOTE — OR Nursing (Signed)
Patient position reassessed when legs raised in stirrups for TVT portion of procedure. Noted ION arm positioners causing compression on patients thighs. Extra padding added to pressure areas. Linde Gillis CRNA assisted. Dr Philis Pique aware.

## 2015-01-23 NOTE — Progress Notes (Signed)
There has been no change in the patients history, status or exam since the history and physical.  Filed Vitals:   01/23/15 0607  BP: 145/89  Pulse: 81  Temp: 98.3 F (36.8 C)  TempSrc: Oral  Resp: 18  SpO2: 99%    Lab Results  Component Value Date   WBC 9.5 01/08/2015   HGB 13.6 01/08/2015   HCT 39.7 01/08/2015   MCV 90.4 01/08/2015   PLT 391 01/08/2015    Czarina Gingras A

## 2015-01-23 NOTE — Anesthesia Postprocedure Evaluation (Signed)
  Anesthesia Post-op Note  Patient: Theresa Mathews  Procedure(s) Performed: Procedure(s): ROBOTIC ASSISTED TOTAL HYSTERECTOMY WITH BILATERAL SALPINGO OOPHORECTOMY (Bilateral) TRANSVAGINAL TAPE (TVT) PROCEDURE (N/A) CYSTOSCOPY (N/A)  Patient Location: [facility]  Anesthesia Type:General  Level of Consciousness: awake  Airway and Oxygen Therapy: Patient Spontanous Breathing  Post-op Pain: mild  Post-op Assessment: Patient's Cardiovascular Status Stable and Respiratory Function Stable              Post-op Vital Signs: stable  Last Vitals:  Filed Vitals:   01/23/15 1224  BP: 131/76  Pulse:   Temp: 36.7 C  Resp: 16    Complications: No apparent anesthesia complications

## 2015-01-23 NOTE — Addendum Note (Signed)
Addendum  created 01/23/15 1332 by Ignacia Bayley, CRNA   Modules edited: Notes Section   Notes Section:  File: [id number]

## 2015-01-23 NOTE — Op Note (Signed)
01/23/2015  10:34 AM  PATIENT: Theresa Mathews 49 y.o. female  PRE-OPERATIVE DIAGNOSIS: FIBROIDS  POST-OPERATIVE DIAGNOSIS: * No post-op diagnosis entered *  PROCEDURE: Procedure(s): ROBOTIC ASSISTED TOTAL HYSTERECTOMY WITH POSSIBLE BILATERAL SALPINGO OOPHORECTOMY (Bilateral) BILATERAL SALPINGECTOMY (Bilateral) CYSTOSCOPY (N/A)  SURGEON: Surgeon(s) and Role:  * Bobbye Charleston, MD - Primary  PHYSICIAN ASSISTANT:   ASSISTANTS: Dr. Deatra Ina   ANESTHESIA: general  EBL: 100 cc  LOCAL MEDICATIONS USED: OTHER Ropivicaine  SPECIMEN: Source of Specimen: uterus, cervix, tubes and ovaries  DISPOSITION OF SPECIMEN: PATHOLOGY  COUNTS: YES  TOURNIQUET: * No tourniquets in log *  DICTATION: .Note written in EPIC  PLAN OF CARE: Admit for overnight observation  PATIENT DISPOSITION: PACU - hemodynamically stable.  Delay start of Pharmacological VTE agent (>24hrs) due to surgical blood loss or risk of bleeding: not applicable  Complications: None.  Findings: 9 weeks size uterus. Ovaries were normal bilaterally. The ureters were identified during multiple points of the case and were always out of the field of dissection. On cystoscopy, the bladder was intact and bilateral spill was seen from each ureteral oriface. On dissection of the vaginal mucosa off the vesicovaginal tissue, an area was dissected very closely to the L urethra- immediate cystoscopy proved that the urethra was intact and the area was reinforced with 3-0 vicryl before proceeding with TVT.  Medications: Ancef. Pyridium preop. Exparel intra peritoneal.  Technique:  After adequate anesthesia was achieved the patient was positioned, prepped and draped in usual sterile fashion. A speculum was placed in the vagina and the cervix dilated with pratt dilators. The 8 cm Rumi and 3 cm Koh ring were assembled and placed in proper fashion. The Speculum was removed and the bladder catheterized with  a foley.   Attention was turned to the abdomen where a 1 cm incision was made above the umbilicus. The veress needle was inserted without aspiration of bowel contents or blood. The long 12 mm trocar was placed and the other three trocar sites were marked out, all approximately 10 cm from each other and the camera. Two 8.5 mm trocars were placed on either side of the camera port. A 5 mm assistant port was placed 3 cm above the left iliac crest. All trocars were inserted under direct visualization of the camera. The patient was placed in trendelenburg and then the Robot docked. The PK forceps were placed on arm 2 and the Hot shears on arm 1 and introduced under direct visualization of the camera.  I then broke scrub and sat down at the console. The above findings were noted and the ureters identified well out of the field of dissection. The right round ligament was identified and cauterized with the PK. It was then divided with the PK cautery and shears. The peritoneum parallel to the IP ligament was carefully incised and the posterior broad ligament was then incised under the right IP ligament with care to be above the ureter. The R ureter was dissected and followed to the junction with the uterine artery. The R IP ligament was cauterized with the PK and then incised with the shears. The Broad and cardinal ligaments were then skeletonized against the cervix to the level of the Koh ring, exposing the uterine artery. The uterine artery and branches were cauterized with the PK and incised with the shears. The anterior leaf was then incised at the reflection of the vessico-uterine junction and the lateral bladder retracted inferiorly after the round ligament had been divided with the PK forceps. The  round ligament was cauterized with the PK and divided with the shears; then the left IP ligament divided with the PK forceps and the scissors in the same manner as the right. The broad and cardinal  ligaments were then skeletonized and cauterized on the left in the same way. The ureters were identified well out of the field of dissection. The anterior peritoneum was tented up and incised with the monopolar and the bladder retracted inferiorly. The vesicovaginal fascia was incised with the monopolar shears and then pushed inferiorly off the vagina to about one cm below the Koh ring.   The uterus was amputated at the level of the reflection of the vagina onto the cervix with monopolar cautery and then removed through the vagina. The pedicles were checked with the gas pressure down and found to be hemostatic. The vaginal cuff was closed with a running stitch of 2-0 V-Loc. The Robot was undocked and Ropvicaine instilled inside the peritoneal cavity at the cuff. All instruments and trocars were withdrawn from the abdomen, the abdomen desufflated and the 12 mm fascial incision closed with a figure of eight stitch of 2-0 vicryl. The skin incisions were closed with 4-0 vicryl R and dermabond. The cystoscope was placed in the bladder and good spill was seen from the bilateral ureteral orifices. The bladder was intact.   The apex of the cystocele grasped with two allises. Allises were used to grasp the vaginal mucosa in the midline superior to inferior just below the urethral opening. The mucosa was injected with 1% lidocaine with epi in the midline and a scalpel used to incise the vaginal mucosa vertically. Allises were used to retract the vaginal mucosa as the vesico-vaginal fascia was removed from the mucosa with blunt and sharp dissection and adequate room midurethral to pelvic floor bilaterally was developed. At this point on L a deeper defect near urethra was palpated and after checking patency of the urethra with the cysto, the area was reinforced with 3-0 vicryl. Two small puncture incisions were then made two cm from midline just above the pubic symphysis. The abdominal needles from the  Gynecare TVT set were introduced behind the pubic bone, through the pelvic floor and out the vagina carefully on each respective side, being careful to hug the posterior of the pubic bone. The foley was removed and indigo carmine given. Cystoscopy revealed excellent bilateral spill from each ureteral orifice and NO NEEDLES IN THE BLADDER. The urethra was clear as well. The foley was replaced and the vaginal needles attached to the abdominal needles. The tape was pulled into place through the abdominal incisions, the sheaths removed while a kelly was in place under the mesh sling and the tape cut at the level of just below the skin. The tape was checked and found to be tight enough and laying flat.  Gelfoam was placed in the corners up by the pelvic floor to achieve hemostasis of some small bleeders out of reach and too close to the sling for stitches. Once hemostasis was achieved, one mattress stitch of 0-vicryl were placed to close the vesico-vaginal fascia under the bladder. The vaginal mucosa was trimmed and then closed with a running lock stitch of 2-0 vicryl. A vaginal packing with estrace was placed and the patient returned to the recovery room in stable condition.   The patient tolerated the procedure well and was returned to the recovery room in stable condition.

## 2015-01-23 NOTE — Anesthesia Procedure Notes (Signed)
Procedure Name: Intubation Date/Time: 01/23/2015 7:30 AM Performed by: Raenette Rover Pre-anesthesia Checklist: Patient identified, Emergency Drugs available, Suction available and Patient being monitored Patient Re-evaluated:Patient Re-evaluated prior to inductionOxygen Delivery Method: Circle system utilized Preoxygenation: Pre-oxygenation with 100% oxygen Intubation Type: IV induction Ventilation: Mask ventilation without difficulty and Oral airway inserted - appropriate to patient size Laryngoscope Size: Sabra Heck and 2 Grade View: Grade I Tube type: Oral Tube size: 7.0 mm Number of attempts: 1 Airway Equipment and Method: Patient positioned with wedge pillow and Stylet Placement Confirmation: ETT inserted through vocal cords under direct vision,  breath sounds checked- equal and bilateral,  positive ETCO2 and CO2 detector Secured at: 20 cm Tube secured with: Tape Dental Injury: Teeth and Oropharynx as per pre-operative assessment

## 2015-01-24 DIAGNOSIS — D259 Leiomyoma of uterus, unspecified: Secondary | ICD-10-CM | POA: Diagnosis not present

## 2015-01-24 MED ORDER — CEFUROXIME AXETIL 250 MG PO TABS: 250.0000 mg | ORAL_TABLET | Freq: Two times a day (BID) | ORAL | Status: DC

## 2015-01-24 MED ORDER — OXYCODONE-ACETAMINOPHEN 5-325 MG PO TABS: 1.0000 | ORAL_TABLET | ORAL | Status: DC | PRN

## 2015-01-24 NOTE — Progress Notes (Signed)
Patient discharge instructions reviewed.  Patient to follow up with Dr Philis Pique for foley removal and voiding trials.  No questions at this time.  Vital signs stable and pain controlled.  Ambulated to car by family member and NT. To be driven home in private vehicle by family member.

## 2015-01-24 NOTE — Discharge Instructions (Signed)
Keep Foley in place- give patient leg bag and show how to change bags.  Instruct on care. voiding trial at office- call and come in at 50 am Tuesday.

## 2015-01-24 NOTE — Progress Notes (Signed)
Patient is eating, ambulating, not voiding-foley in.  Pain control is good.  BP 120/59 mmHg  Pulse 61  Temp(Src) 97.6 F (36.4 C) (Oral)  Resp 18  Ht 5\' 4"  (1.626 m)  Wt 107.049 kg (236 lb)  BMI 40.49 kg/m2  SpO2 98%  lungs:   clear to auscultation cor:    RRR Abdomen:  soft, appropriate tenderness, incisions intact and without erythema or exudate. ex:    no cords   Lab Results  Component Value Date   WBC 9.5 01/08/2015   HGB 13.6 01/08/2015   HCT 39.7 01/08/2015   MCV 90.4 01/08/2015   PLT 391 01/08/2015    A/P  Routine care.  Expect d/c per plan.

## 2015-01-24 NOTE — Discharge Summary (Signed)
Physician Discharge Summary  Patient ID: Theresa Mathews MRN: [id number] DOB/AGE: Aug 18, 1965 49 y.o.  Admit date: 01/23/2015 Discharge date: 01/24/2015  Admission Diagnoses:SUI, thickened endometrium, fhx of ovarian ca  Discharge Diagnoses: same Active Problems:   Postoperative state   Discharged Condition: good  Hospital Course: Uncomplicated TLH/BSO.  TVT and a repair- close dissection to urethra- will keep cath for 5 days.  Uncomplicated post op course.  Consults: None  Significant Diagnostic Studies: labs:  Results for orders placed or performed during the hospital encounter of 01/23/15 (from the past 72 hour(s))  Pregnancy, urine     Status: None   Collection Time: 01/23/15  6:00 AM  Result Value Ref Range   Preg Test, Ur NEGATIVE NEGATIVE    Comment:        THE SENSITIVITY OF THIS METHODOLOGY IS >20 mIU/mL.     Treatments: surgery: TLH/BSO/TVT/A repair/cysto  Discharge Exam: Blood pressure 120/59, pulse 61, temperature 97.6 F (36.4 C), temperature source Oral, resp. rate 18, height 5\' 4"  (1.626 m), weight 107.049 kg (236 lb), SpO2 98 %.   Disposition: Final discharge disposition not confirmed  Discharge Instructions    Call MD for:  temperature >100.4    Complete by:  As directed      Diet - low sodium heart healthy    Complete by:  As directed      Discharge instructions    Complete by:  As directed   No driving on narcotics, no sexual activity for 2 weeks.     Increase activity slowly    Complete by:  As directed      May shower / Bathe    Complete by:  As directed   Shower, no bath for 2 weeks.     Remove dressing in 24 hours    Complete by:  As directed      Sexual Activity Restrictions    Complete by:  As directed   No sexual activity for 2 weeks.            Medication List    TAKE these medications        ACZONE 5 % topical gel  Generic drug:  Dapsone  Apply 1 application topically at bedtime.     citalopram 10 MG tablet  Commonly  known as:  CELEXA  Take 1 tablet by mouth daily.     oxyCODONE-acetaminophen 5-325 MG tablet  Commonly known as:  PERCOCET/ROXICET  Take 1-2 tablets by mouth every 4 (four) hours as needed for severe pain (moderate to severe pain (when tolerating fluids)).     ranitidine 75 MG tablet  Commonly known as:  ZANTAC  Take 75 mg by mouth 2 (two) times daily.           Follow-up Information    Follow up with Crystallee Werden A, MD.   Specialty:  Obstetrics and Gynecology   Why:  for voiding trial   Contact information:   [address] [place] [address] [phone]       Signed: Vadis Slabach A 01/24/2015, 9:01 AM

## 2015-01-24 NOTE — Plan of Care (Signed)
Problem: Phase II Progression Outcomes Goal: Foley discontinued Outcome: Not Applicable Date Met:  37/16/96 To be done outpatient at follow up appointment    Goal: Voiding trials/Bladder training within 48 hrs Outcome: Not Applicable Date Met:  78/93/81 To be done outpatient at follow up appointment

## 2015-01-25 ENCOUNTER — Encounter (HOSPITAL_COMMUNITY): Payer: Self-pay | Admitting: Obstetrics and Gynecology

## 2016-04-03 ENCOUNTER — Other Ambulatory Visit: Payer: Self-pay | Admitting: Obstetrics and Gynecology

## 2016-04-06 ENCOUNTER — Other Ambulatory Visit: Payer: Self-pay | Admitting: Obstetrics and Gynecology

## 2016-04-06 DIAGNOSIS — R928 Other abnormal and inconclusive findings on diagnostic imaging of breast: Secondary | ICD-10-CM

## 2016-04-06 LAB — CYTOLOGY - PAP

## 2016-04-13 ENCOUNTER — Ambulatory Visit
Admission: RE | Admit: 2016-04-13 | Discharge: 2016-04-13 | Disposition: A | Discharge status: Home / Self Care | Payer: BLUE CROSS/BLUE SHIELD | Source: Ambulatory Visit | Attending: Obstetrics and Gynecology | Admitting: Obstetrics and Gynecology

## 2016-04-13 DIAGNOSIS — R928 Other abnormal and inconclusive findings on diagnostic imaging of breast: Secondary | ICD-10-CM

## 2016-12-18 DIAGNOSIS — L219 Seborrheic dermatitis, unspecified: Secondary | ICD-10-CM | POA: Diagnosis not present

## 2016-12-18 DIAGNOSIS — Z86018 Personal history of other benign neoplasm: Secondary | ICD-10-CM | POA: Diagnosis not present

## 2016-12-18 DIAGNOSIS — D225 Melanocytic nevi of trunk: Secondary | ICD-10-CM | POA: Diagnosis not present

## 2016-12-18 DIAGNOSIS — D2372 Other benign neoplasm of skin of left lower limb, including hip: Secondary | ICD-10-CM | POA: Diagnosis not present

## 2017-02-06 DIAGNOSIS — Z713 Dietary counseling and surveillance: Secondary | ICD-10-CM | POA: Diagnosis not present

## 2017-02-06 DIAGNOSIS — Z23 Encounter for immunization: Secondary | ICD-10-CM | POA: Diagnosis not present

## 2017-02-06 DIAGNOSIS — Z136 Encounter for screening for cardiovascular disorders: Secondary | ICD-10-CM | POA: Diagnosis not present

## 2017-02-06 DIAGNOSIS — Z13220 Encounter for screening for lipoid disorders: Secondary | ICD-10-CM | POA: Diagnosis not present

## 2017-02-06 DIAGNOSIS — Z131 Encounter for screening for diabetes mellitus: Secondary | ICD-10-CM | POA: Diagnosis not present

## 2017-04-06 ENCOUNTER — Telehealth: Payer: Self-pay

## 2017-04-06 NOTE — Telephone Encounter (Signed)
That is fine 

## 2017-04-06 NOTE — Telephone Encounter (Signed)
Copied from [place] [id number]. Topic: Inquiry >> Apr 06, 2017  4:22 PM Cecelia Byars, NT wrote: Reason for CRM: Patient would like to begin care again with Dr Etter SjogrenCheri Rous again please advise [phone]

## 2017-04-06 NOTE — Telephone Encounter (Signed)
Can you call Pt to re-establish w/ Dr. Etter Sjogren please?

## 2017-04-06 NOTE — Telephone Encounter (Signed)
Please advise 

## 2017-04-06 NOTE — Telephone Encounter (Signed)
Made an appt to re-establish on 04/12/2017

## 2017-04-12 ENCOUNTER — Telehealth: Payer: Self-pay

## 2017-04-12 ENCOUNTER — Encounter: Payer: Self-pay | Admitting: Family Medicine

## 2017-04-12 ENCOUNTER — Ambulatory Visit: Payer: BLUE CROSS/BLUE SHIELD | Admitting: Family Medicine

## 2017-04-12 VITALS — BP 126/80 | HR 72 | Temp 98.0°F | Resp 16 | Ht 65.0 in | Wt 238.8 lb

## 2017-04-12 DIAGNOSIS — F419 Anxiety disorder, unspecified: Secondary | ICD-10-CM | POA: Diagnosis not present

## 2017-04-12 DIAGNOSIS — Z0000 Encounter for general adult medical examination without abnormal findings: Secondary | ICD-10-CM | POA: Diagnosis not present

## 2017-04-12 DIAGNOSIS — F418 Other specified anxiety disorders: Secondary | ICD-10-CM | POA: Insufficient documentation

## 2017-04-12 DIAGNOSIS — F329 Major depressive disorder, single episode, unspecified: Secondary | ICD-10-CM | POA: Diagnosis not present

## 2017-04-12 DIAGNOSIS — Z6841 Body Mass Index (BMI) 40.0 and over, adult: Secondary | ICD-10-CM | POA: Diagnosis not present

## 2017-04-12 DIAGNOSIS — F32A Depression, unspecified: Secondary | ICD-10-CM

## 2017-04-12 MED ORDER — LIRAGLUTIDE -WEIGHT MANAGEMENT 18 MG/3ML ~~LOC~~ SOPN: 3.0000 mg | PEN_INJECTOR | Freq: Every day | SUBCUTANEOUS | 5 refills | Status: DC

## 2017-04-12 NOTE — Telephone Encounter (Signed)
PA initiated via Covermymeds; KEY: [id number]. Received real-time PA approval.  [id number];[id number];Review Type:Prior Auth;Coverage Start Date:03/30/2017;Coverage End Date:08/27/2017;

## 2017-04-12 NOTE — Patient Instructions (Addendum)
Saxenda  Start with 0.6 daily for 1 week  1.2 daily for 1 week 1.8 daily for 1 week 2.4 daily for 1 week 3.0 daily for 1 week there after   Obesity, Adult Obesity is the condition of having too much total body fat. Being overweight or obese means that your weight is greater than what is considered healthy for your body size. Obesity is determined by a measurement called BMI. BMI is an estimate of body fat and is calculated from height and weight. For adults, a BMI of 30 or higher is considered obese. Obesity can eventually lead to other health concerns and major illnesses, including:  Stroke.  Coronary artery disease (CAD).  Type 2 diabetes.  Some types of cancer, including cancers of the colon, breast, uterus, and gallbladder.  Osteoarthritis.  High blood pressure (hypertension).  High cholesterol.  Sleep apnea.  Gallbladder stones.  Infertility problems.  What are the causes? The main cause of obesity is taking in (consuming) more calories than your body uses for energy. Other factors that contribute to this condition may include:  Being born with genes that make you more likely to become obese.  Having a medical condition that causes obesity. These conditions include: ? Hypothyroidism. ? Polycystic ovarian syndrome (PCOS). ? Binge-eating disorder. ? Cushing syndrome.  Taking certain medicines, such as steroids, antidepressants, and seizure medicines.  Not being physically active (sedentary lifestyle).  Living where there are limited places to exercise safely or buy healthy foods.  Not getting enough sleep.  What increases the risk? The following factors may increase your risk of this condition:  Having a family history of obesity.  Being a woman of African-American descent.  Being a man of Hispanic descent.  What are the signs or symptoms? Having excessive body fat is the main symptom of this condition. How is this diagnosed? This condition may be  diagnosed based on:  Your symptoms.  Your medical history.  A physical exam. Your health care provider may measure: ? Your BMI. If you are an adult with a BMI between 25 and less than 30, you are considered overweight. If you are an adult with a BMI of 30 or higher, you are considered obese. ? The distances around your hips and your waist (circumferences). These may be compared to each other to help diagnose your condition. ? Your skinfold thickness. Your health care provider may gently pinch a fold of your skin and measure it.  How is this treated? Treatment for this condition often includes changing your lifestyle. Treatment may include some or all of the following:  Dietary changes. Work with your health care provider and a dietitian to set a weight-loss goal that is healthy and reasonable for you. Dietary changes may include eating: ? Smaller portions. A portion size is the amount of a particular food that is healthy for you to eat at one time. This varies from person to person. ? Low-calorie or low-fat options. ? More whole grains, fruits, and vegetables.  Regular physical activity. This may include aerobic activity (cardio) and strength training.  Medicine to help you lose weight. Your health care provider may prescribe medicine if you are unable to lose 1 pound a week after 6 weeks of eating more healthily and doing more physical activity.  Surgery. Surgical options may include gastric banding and gastric bypass. Surgery may be done if: ? Other treatments have not helped to improve your condition. ? You have a BMI of 40 or higher. ? You  have life-threatening health problems related to obesity.  Follow these instructions at home:  Eating and drinking   Follow recommendations from your health care provider about what you eat and drink. Your health care provider may advise you to: ? Limit fast foods, sweets, and processed snack foods. ? Choose low-fat options, such as low-fat  milk instead of whole milk. ? Eat 5 or more servings of fruits or vegetables every day. ? Eat at home more often. This gives you more control over what you eat. ? Choose healthy foods when you eat out. ? Learn what a healthy portion size is. ? Keep low-fat snacks on hand. ? Avoid sugary drinks, such as soda, fruit juice, iced tea sweetened with sugar, and flavored milk. ? Eat a healthy breakfast.  Drink enough water to keep your urine clear or pale yellow.  Do not go without eating for long periods of time (do not fast) or follow a fad diet. Fasting and fad diets can be unhealthy and even dangerous. Physical Activity  Exercise regularly, as told by your health care provider. Ask your health care provider what types of exercise are safe for you and how often you should exercise.  Warm up and stretch before being active.  Cool down and stretch after being active.  Rest between periods of activity. Lifestyle  Limit the time that you spend in front of your TV, computer, or video game system.  Find ways to reward yourself that do not involve food.  Limit alcohol intake to no more than 1 drink a day for nonpregnant women and 2 drinks a day for men. One drink equals 12 oz of beer, 5 oz of wine, or 1 oz of hard liquor. General instructions  Keep a weight loss journal to keep track of the food you eat and how much you exercise you get.  Take over-the-counter and prescription medicines only as told by your health care provider.  Take vitamins and supplements only as told by your health care provider.  Consider joining a support group. Your health care provider may be able to recommend a support group.  Keep all follow-up visits as told by your health care provider. This is important. Contact a health care provider if:  You are unable to meet your weight loss goal after 6 weeks of dietary and lifestyle changes. This information is not intended to replace advice given to you by your  health care provider. Make sure you discuss any questions you have with your health care provider. Document Released: 04/23/2004 Document Revised: 08/19/2015 Document Reviewed: 01/02/2015 Elsevier Interactive Patient Education  2018 [employer].

## 2017-04-12 NOTE — Assessment & Plan Note (Signed)
Discussed diet and exercise Pt would like to try saxenda-- pt instructed on how to use it Also gave her infor for healthy weight and wellness Pt has tried phenteramin 37.5 and qysymia in the past-- they were not effective and caused palpitations

## 2017-04-12 NOTE — Progress Notes (Signed)
Patient ID: [name] MOL, female    DOB: 25-Jan-1966  Age: 52 y.o. MRN: [id number]    Subjective:  Subjective  HPI Theresa Mathews presents to establish and discuss weight loss.  She is struggling to lose weight and find time to exercise    Review of Systems  Constitutional: Negative for activity change, appetite change, fatigue and unexpected weight change.  Respiratory: Negative for cough and shortness of breath.   Cardiovascular: Negative for chest pain and palpitations.  Psychiatric/Behavioral: Negative for behavioral problems and dysphoric mood. The patient is not nervous/anxious.     History Past Medical History:  Diagnosis Date  . Depression   . GERD (gastroesophageal reflux disease)     She has a past surgical history that includes Dilation and curettage of uterus; Robotic assisted total hysterectomy with bilateral salpingo oophorectomy (Bilateral, 01/23/2015); Bladder suspension (N/A, 01/23/2015); and Cystoscopy (N/A, 01/23/2015).   Her family history includes Diabetes in her father; Heart disease (age of onset: 59) in her mother; Hyperlipidemia in her father; Hypertension in her father and mother; Ovarian cancer in her mother.She reports that  has never smoked. she has never used smokeless tobacco. She reports that she drinks about 1.2 oz of alcohol per week. She reports that she does not use drugs.  Current Outpatient Medications on File Prior to Visit  Medication Sig Dispense Refill  . citalopram (CELEXA) 10 MG tablet Take 1 tablet by mouth daily.    . ranitidine (ZANTAC) 75 MG tablet Take 75 mg by mouth 2 (two) times daily.     No current facility-administered medications on file prior to visit.      Objective:  Objective  Physical Exam  Constitutional: She is oriented to person, place, and time. She appears well-developed and well-nourished.  HENT:  Head: Normocephalic and atraumatic.  Eyes: Conjunctivae and EOM are normal.  Neck: Normal range of motion. Neck  supple. No JVD present. Carotid bruit is not present. No thyromegaly present.  Cardiovascular: Normal rate, regular rhythm and normal heart sounds.  No murmur heard. Pulmonary/Chest: Effort normal and breath sounds normal. No respiratory distress. She has no wheezes. She has no rales. She exhibits no tenderness.  Abdominal: Soft. She exhibits no distension. There is no tenderness. There is no rebound, no guarding and no CVA tenderness.  Genitourinary: Pelvic exam was performed with patient supine. There is no rash, tenderness, lesion or injury on the right labia. There is no rash, tenderness, lesion or injury on the left labia. No erythema or tenderness in the vagina. No vaginal discharge found.  Musculoskeletal: She exhibits no edema.  Neurological: She is alert and oriented to person, place, and time.  Psychiatric: She has a normal mood and affect.  Nursing note and vitals reviewed.  BP 126/80 (BP Location: Right Arm, Cuff Size: Large)   Pulse 72   Temp 98 F (36.7 C) (Oral)   Resp 16   Ht 5\' 5"  (1.651 m)   Wt 238 lb 12.8 oz (108.3 kg)   LMP  (Exact Date)   SpO2 98%   BMI 39.74 kg/m  Wt Readings from Last 3 Encounters:  04/12/17 238 lb 12.8 oz (108.3 kg)  01/23/15 236 lb (107 kg)  01/08/15 236 lb (107 kg)     Lab Results  Component Value Date   WBC 9.5 01/08/2015   HGB 13.6 01/08/2015   HCT 39.7 01/08/2015   PLT 391 01/08/2015   GLUCOSE 105 (H) 02/05/2011   CHOL 185 02/05/2011   TRIG  135.0 02/05/2011   HDL 47.80 02/05/2011   LDLCALC 110 (H) 02/05/2011   ALT 19 02/05/2011   AST 20 02/05/2011   NA 144 02/05/2011   K 4.4 02/05/2011   CL 106 02/05/2011   CREATININE 0.8 02/05/2011   BUN 13 02/05/2011   CO2 29 02/05/2011   TSH 1.27 02/05/2011    Mm Diag Breast Tomo Uni Left  Result Date: 04/13/2016 CLINICAL DATA:  Possible mass in the lower outer left breast on a recent screening mammogram. EXAM: 2D DIGITAL DIAGNOSTIC UNILATERAL LEFT MAMMOGRAM WITH CAD AND ADJUNCT  TOMO COMPARISON:  Previous exam(s). ACR Breast Density Category c: The breast tissue is heterogeneously dense, which may obscure small masses. FINDINGS: 2D and 3D true lateral and spot compression views of the left breast were obtained. These demonstrate normal appearing breast tissue at the location of the recently suspected mass in the lower outer portion of the breast. Mammographic images were processed with CAD. IMPRESSION: No evidence of malignancy. The recently suspected left breast mass was close apposition of normal breast tissue. RECOMMENDATION: Bilateral screening mammogram in 1 year. I have discussed the findings and recommendations with the patient. Results were also provided in writing at the conclusion of the visit. If applicable, a reminder letter will be sent to the patient regarding the next appointment. BI-RADS CATEGORY  1: Negative. Electronically Signed   By: Claudie Revering M.D.   On: 04/13/2016 09:13     Assessment & Plan:  Plan  I have discontinued Shrita Thien. Shean's Dapsone, oxyCODONE-acetaminophen, and cefUROXime. I am also having her start on Liraglutide -Weight Management. Additionally, I am having her maintain her citalopram and ranitidine.  Meds ordered this encounter  Medications  . Liraglutide -Weight Management (SAXENDA) 18 MG/3ML SOPN    Sig: Inject 3 mg into the skin daily.    Dispense:  3 mL    Refill:  5    Problem List Items Addressed This Visit      Unprioritized   Anxiety and depression   Morbid obesity with body mass index of 40.0-44.9 in adult [facility])    Discussed diet and exercise Pt would like to try saxenda-- pt instructed on how to use it Also gave her infor for healthy weight and wellness Pt has tried phenteramin 37.5 and qysymia in the past-- they were not effective and caused palpitations       Relevant Medications   Liraglutide -Weight Management ([place]) 18 MG/3ML SOPN    Other Visit Diagnoses    Preventative health care    -  Primary    Relevant Orders   Ambulatory referral to Gastroenterology      Follow-up: Return in about 3 months (around 07/11/2017), or weight check and labs , for fasting, annual exam.  Ann Held, DO

## 2017-04-15 ENCOUNTER — Encounter: Payer: Self-pay | Admitting: Family Medicine

## 2017-04-15 MED ORDER — INSULIN PEN NEEDLE 32G X 4 MM MISC: 1 refills | Status: DC

## 2017-05-14 ENCOUNTER — Encounter: Payer: Self-pay | Admitting: Family Medicine

## 2017-07-12 ENCOUNTER — Encounter: Payer: Self-pay | Admitting: Family Medicine

## 2017-07-12 ENCOUNTER — Ambulatory Visit (INDEPENDENT_AMBULATORY_CARE_PROVIDER_SITE_OTHER): Payer: BLUE CROSS/BLUE SHIELD | Admitting: Family Medicine

## 2017-07-12 VITALS — BP 130/82 | HR 72 | Temp 97.7°F | Resp 16 | Ht 65.0 in | Wt 225.4 lb

## 2017-07-12 DIAGNOSIS — Z23 Encounter for immunization: Secondary | ICD-10-CM

## 2017-07-12 DIAGNOSIS — Z6841 Body Mass Index (BMI) 40.0 and over, adult: Secondary | ICD-10-CM

## 2017-07-12 DIAGNOSIS — Z1211 Encounter for screening for malignant neoplasm of colon: Secondary | ICD-10-CM | POA: Diagnosis not present

## 2017-07-12 DIAGNOSIS — Z0000 Encounter for general adult medical examination without abnormal findings: Secondary | ICD-10-CM

## 2017-07-12 LAB — TSH: TSH: 1.27 u[IU]/mL (ref 0.35–4.50)

## 2017-07-12 LAB — CBC WITH DIFFERENTIAL/PLATELET
Basophils Absolute: 0.1 10*3/uL (ref 0.0–0.1)
Basophils Relative: 1 % (ref 0.0–3.0)
Eosinophils Absolute: 0.2 10*3/uL (ref 0.0–0.7)
Eosinophils Relative: 2.7 % (ref 0.0–5.0)
HCT: 39.8 % (ref 36.0–46.0)
Hemoglobin: 13.7 g/dL (ref 12.0–15.0)
Lymphocytes Relative: 24.7 % (ref 12.0–46.0)
Lymphs Abs: 2.1 10*3/uL (ref 0.7–4.0)
MCHC: 34.5 g/dL (ref 30.0–36.0)
MCV: 88.9 fl (ref 78.0–100.0)
Monocytes Absolute: 0.5 10*3/uL (ref 0.1–1.0)
Monocytes Relative: 5.7 % (ref 3.0–12.0)
Neutro Abs: 5.6 10*3/uL (ref 1.4–7.7)
Neutrophils Relative %: 65.9 % (ref 43.0–77.0)
Platelets: 383 10*3/uL (ref 150.0–400.0)
RBC: 4.47 Mil/uL (ref 3.87–5.11)
RDW: 13.7 % (ref 11.5–15.5)
WBC: 8.5 10*3/uL (ref 4.0–10.5)

## 2017-07-12 LAB — COMPREHENSIVE METABOLIC PANEL
ALBUMIN: 4.2 g/dL (ref 3.5–5.2)
ALT: 15 U/L (ref 0–35)
AST: 14 U/L (ref 0–37)
Alkaline Phosphatase: 93 U/L (ref 39–117)
BILIRUBIN TOTAL: 0.5 mg/dL (ref 0.2–1.2)
BUN: 13 mg/dL (ref 6–23)
CALCIUM: 9.5 mg/dL (ref 8.4–10.5)
CHLORIDE: 105 meq/L (ref 96–112)
CO2: 29 meq/L (ref 19–32)
Creatinine, Ser: 0.79 mg/dL (ref 0.40–1.20)
GFR: 81.47 mL/min (ref >60.00)
Glucose, Bld: 97 mg/dL (ref 70–99)
Potassium: 5 mEq/L (ref 3.5–5.1)
Sodium: 144 mEq/L (ref 135–145)
Total Protein: 7.5 g/dL (ref 6.0–8.3)

## 2017-07-12 LAB — LIPID PANEL
Cholesterol: 183 mg/dL (ref 0–200)
HDL: 49.3 mg/dL
LDL Cholesterol: 113 mg/dL — ABNORMAL HIGH (ref 0–99)
NonHDL: 134.04
Total CHOL/HDL Ratio: 4
Triglycerides: 106 mg/dL (ref 0.0–149.0)
VLDL: 21.2 mg/dL (ref 0.0–40.0)

## 2017-07-12 MED ORDER — LIRAGLUTIDE -WEIGHT MANAGEMENT 18 MG/3ML ~~LOC~~ SOPN: 3.0000 mg | PEN_INJECTOR | Freq: Every day | SUBCUTANEOUS | 5 refills | Status: DC

## 2017-07-12 NOTE — Progress Notes (Signed)
Subjective:     Theresa Mathews is a 52 y.o. female and is here for a comprehensive physical exam. The patient reports no problems.  Social History   Socioeconomic History  . Marital status: Married    Spouse name: Not on file  . Number of children: Not on file  . Years of education: Not on file  . Highest education level: Not on file  Occupational History  . Occupation: wells Comptroller: [employer]  Social Needs  . Financial resource strain: Not on file  . Food insecurity:    Worry: Not on file    Inability: Not on file  . Transportation needs:    Medical: Not on file    Non-medical: Not on file  Tobacco Use  . Smoking status: Former Research scientist (life sciences)  . Smokeless tobacco: Never Used  . Tobacco comment: smoked in college  Substance and Sexual Activity  . Alcohol use: Yes    Alcohol/week: 1.2 oz    Types: 2 Glasses of wine per week    Comment: 2-3  . Drug use: No  . Sexual activity: Yes    Partners: Male  Lifestyle  . Physical activity:    Days per week: Not on file    Minutes per session: Not on file  . Stress: Not on file  Relationships  . Social connections:    Talks on phone: Not on file    Gets together: Not on file    Attends religious service: Not on file    Active member of club or organization: Not on file    Attends meetings of clubs or organizations: Not on file    Relationship status: Not on file  . Intimate partner violence:    Fear of current or ex partner: Not on file    Emotionally abused: Not on file    Physically abused: Not on file    Forced sexual activity: Not on file  Other Topics Concern  . Not on file  Social History Narrative   Exercise- walks 4 times a week   Health Maintenance  Topic Date Due  . MAMMOGRAM  08/09/2015  . COLONOSCOPY  04/17/2016  . HIV Screening  07/13/2023 (Originally 04/17/1980)  . TETANUS/TDAP  07/29/2017  . INFLUENZA VACCINE  10/28/2017  . PAP SMEAR  04/04/2019    The following portions of  the patient's history were reviewed and updated as appropriate:  She  has a past medical history of Depression and GERD (gastroesophageal reflux disease). She does not have any pertinent problems on file. She  has a past surgical history that includes Dilation and curettage of uterus; Robotic assisted total hysterectomy with bilateral salpingo oophorectomy (Bilateral, 01/23/2015); Bladder suspension (N/A, 01/23/2015); and Cystoscopy (N/A, 01/23/2015). Her family history includes Diabetes in her father; Heart disease (age of onset: 52) in her mother; Hyperlipidemia in her father; Hypertension in her father and mother; Ovarian cancer in her mother. She  reports that she has quit smoking. She has never used smokeless tobacco. She reports that she drinks about 1.2 oz of alcohol per week. She reports that she does not use drugs. She has a current medication list which includes the following prescription(s): citalopram, insulin pen needle, liraglutide -weight management, and ranitidine. Current Outpatient Medications on File Prior to Visit  Medication Sig Dispense Refill  . citalopram (CELEXA) 10 MG tablet Take 1 tablet by mouth daily.    . Insulin Pen Needle 32G X 4 MM MISC Use as  directed once a day with Saxenda 100 each 1  . ranitidine (ZANTAC) 75 MG tablet Take 75 mg by mouth 2 (two) times daily.     No current facility-administered medications on file prior to visit.    She has No Known Allergies..  Review of Systems Review of Systems  Constitutional: Negative for activity change, appetite change and fatigue.  HENT: Negative for hearing loss, congestion, tinnitus and ear discharge.  dentist q28m Eyes: Negative for visual disturbance (see optho q1y -- vision corrected to 20/20 with glasses).  Respiratory: Negative for cough, chest tightness and shortness of breath.   Cardiovascular: Negative for chest pain, palpitations and leg swelling.  Gastrointestinal: Negative for abdominal pain, diarrhea,  constipation and abdominal distention.  Genitourinary: Negative for urgency, frequency, decreased urine volume and difficulty urinating.  Musculoskeletal: Negative for back pain, arthralgias and gait problem.  Skin: Negative for color change, pallor and rash.  Neurological: Negative for dizziness, light-headedness, numbness and headaches.  Hematological: Negative for adenopathy. Does not bruise/bleed easily.  Psychiatric/Behavioral: Negative for suicidal ideas, confusion, sleep disturbance, self-injury, dysphoric mood, decreased concentration and agitation.       Objective:    BP 130/82 (BP Location: Right Arm, Cuff Size: Large)   Pulse 72   Temp 97.7 F (36.5 C) (Oral)   Resp 16   Ht 5\' 5"  (1.651 m)   Wt 225 lb 6.4 oz (102.2 kg)   SpO2 98%   BMI 37.51 kg/m  General appearance: alert, cooperative, appears stated age and no distress Head: Normocephalic, without obvious abnormality, atraumatic Eyes: conjunctivae/corneas clear. PERRL, EOM's intact. Fundi benign. Ears: normal TM's and external ear canals both ears Nose: Nares normal. Septum midline. Mucosa normal. No drainage or sinus tenderness. Throat: lips, mucosa, and tongue normal; teeth and gums normal Neck: no adenopathy, no carotid bruit, no JVD, supple, symmetrical, trachea midline and thyroid not enlarged, symmetric, no tenderness/mass/nodules Back: symmetric, no curvature. ROM normal. No CVA tenderness. Lungs: clear to auscultation bilaterally Breasts: normal appearance, no masses or tenderness Heart: regular rate and rhythm, S1, S2 normal, no murmur, click, rub or gallop Abdomen: soft, non-tender; bowel sounds normal; no masses,  no organomegaly Pelvic: not indicated; status post hysterectomy, negative ROS Extremities: extremities normal, atraumatic, no cyanosis or edema Pulses: 2+ and symmetric Skin: Skin color, texture, turgor normal. No rashes or lesions Lymph nodes: Cervical, supraclavicular, and axillary nodes  normal. Neurologic: Alert and oriented X 3, normal strength and tone. Normal symmetric reflexes. Normal coordination and gait    Assessment:    Healthy female exam.      Plan:    ghm utd Check labs  See After Visit Summary for Counseling Recommendations    1. Colon cancer screening   - Ambulatory referral to Gastroenterology  2. Preventative health care Check labs  See AVS - Comprehensive metabolic panel - CBC with Differential/Platelet - Lipid panel - TSH  3. Morbid obesity with body mass index of 40.0-44.9 in adult [facility]) Refer to healthy weight and wellness - Liraglutide -Weight Management (SAXENDA) 18 MG/3ML SOPN; Inject 3 mg into the skin daily.  Dispense: 3 mL; Refill: 5  4. Need for diphtheria-tetanus-pertussis (Tdap) vaccine   - Tdap vaccine greater than or equal to 7yo IM

## 2017-07-12 NOTE — Patient Instructions (Signed)
Preventive Care 40-64 Years, Female Preventive care refers to lifestyle choices and visits with your health care provider that can promote health and wellness. What does preventive care include?  A yearly physical exam. This is also called an annual well check.  Dental exams once or twice a year.  Routine eye exams. Ask your health care provider how often you should have your eyes checked.  Personal lifestyle choices, including: ? Daily care of your teeth and gums. ? Regular physical activity. ? Eating a healthy diet. ? Avoiding tobacco and drug use. ? Limiting alcohol use. ? Practicing safe sex. ? Taking low-dose aspirin daily starting at age 58. ? Taking vitamin and mineral supplements as recommended by your health care provider. What happens during an annual well check? The services and screenings done by your health care provider during your annual well check will depend on your age, overall health, lifestyle risk factors, and family history of disease. Counseling Your health care provider may ask you questions about your:  Alcohol use.  Tobacco use.  Drug use.  Emotional well-being.  Home and relationship well-being.  Sexual activity.  Eating habits.  Work and work Statistician.  Method of birth control.  Menstrual cycle.  Pregnancy history.  Screening You may have the following tests or measurements:  Height, weight, and BMI.  Blood pressure.  Lipid and cholesterol levels. These may be checked every 5 years, or more frequently if you are over 81 years old.  Skin check.  Lung cancer screening. You may have this screening every year starting at age 78 if you have a 30-pack-year history of smoking and currently smoke or have quit within the past 15 years.  Fecal occult blood test (FOBT) of the stool. You may have this test every year starting at age 65.  Flexible sigmoidoscopy or colonoscopy. You may have a sigmoidoscopy every 5 years or a colonoscopy  every 10 years starting at age 30.  Hepatitis C blood test.  Hepatitis B blood test.  Sexually transmitted disease (STD) testing.  Diabetes screening. This is done by checking your blood sugar (glucose) after you have not eaten for a while (fasting). You may have this done every 1-3 years.  Mammogram. This may be done every 1-2 years. Talk to your health care provider about when you should start having regular mammograms. This may depend on whether you have a family history of breast cancer.  BRCA-related cancer screening. This may be done if you have a family history of breast, ovarian, tubal, or peritoneal cancers.  Pelvic exam and Pap test. This may be done every 3 years starting at age 80. Starting at age 36, this may be done every 5 years if you have a Pap test in combination with an HPV test.  Bone density scan. This is done to screen for osteoporosis. You may have this scan if you are at high risk for osteoporosis.  Discuss your test results, treatment options, and if necessary, the need for more tests with your health care provider. Vaccines Your health care provider may recommend certain vaccines, such as:  Influenza vaccine. This is recommended every year.  Tetanus, diphtheria, and acellular pertussis (Tdap, Td) vaccine. You may need a Td booster every 10 years.  Varicella vaccine. You may need this if you have not been vaccinated.  Zoster vaccine. You may need this after age 5.  Measles, mumps, and rubella (MMR) vaccine. You may need at least one dose of MMR if you were born in  1957 or later. You may also need a second dose.  Pneumococcal 13-valent conjugate (PCV13) vaccine. You may need this if you have certain conditions and were not previously vaccinated.  Pneumococcal polysaccharide (PPSV23) vaccine. You may need one or two doses if you smoke cigarettes or if you have certain conditions.  Meningococcal vaccine. You may need this if you have certain  conditions.  Hepatitis A vaccine. You may need this if you have certain conditions or if you travel or work in places where you may be exposed to hepatitis A.  Hepatitis B vaccine. You may need this if you have certain conditions or if you travel or work in places where you may be exposed to hepatitis B.  Haemophilus influenzae type b (Hib) vaccine. You may need this if you have certain conditions.  Talk to your health care provider about which screenings and vaccines you need and how often you need them. This information is not intended to replace advice given to you by your health care provider. Make sure you discuss any questions you have with your health care provider. Document Released: 04/12/2015 Document Revised: 12/04/2015 Document Reviewed: 01/15/2015 Elsevier Interactive Patient Education  [employer].

## 2017-08-11 ENCOUNTER — Encounter (INDEPENDENT_AMBULATORY_CARE_PROVIDER_SITE_OTHER): Payer: Self-pay

## 2017-08-13 ENCOUNTER — Other Ambulatory Visit: Payer: Self-pay | Admitting: Family Medicine

## 2017-08-13 ENCOUNTER — Other Ambulatory Visit: Payer: Self-pay | Admitting: Obstetrics and Gynecology

## 2017-08-13 ENCOUNTER — Encounter: Payer: Self-pay | Admitting: Gastroenterology

## 2017-08-13 DIAGNOSIS — Z1231 Encounter for screening mammogram for malignant neoplasm of breast: Secondary | ICD-10-CM

## 2017-08-24 ENCOUNTER — Ambulatory Visit (AMBULATORY_SURGERY_CENTER): Payer: Self-pay

## 2017-08-24 VITALS — Ht 64.0 in | Wt 226.6 lb

## 2017-08-24 DIAGNOSIS — Z1211 Encounter for screening for malignant neoplasm of colon: Secondary | ICD-10-CM

## 2017-08-24 MED ORDER — SOD PICOSULFATE-MAG OX-CIT ACD 10-3.5-12 MG-GM -GM/160ML PO SOLN: 1.0000 | Freq: Once | ORAL | Status: AC

## 2017-08-24 NOTE — Progress Notes (Signed)
Per pt, no allergies to soy or egg products.Pt not taking any weight loss meds or using  O2 at home.  Pt refused emmi video. 

## 2017-08-26 ENCOUNTER — Encounter (INDEPENDENT_AMBULATORY_CARE_PROVIDER_SITE_OTHER): Payer: Self-pay | Admitting: Family Medicine

## 2017-08-26 ENCOUNTER — Ambulatory Visit (INDEPENDENT_AMBULATORY_CARE_PROVIDER_SITE_OTHER): Payer: BLUE CROSS/BLUE SHIELD | Admitting: Family Medicine

## 2017-08-26 VITALS — BP 116/79 | HR 64 | Temp 97.3°F | Ht 64.0 in | Wt 220.0 lb

## 2017-08-26 DIAGNOSIS — Z0289 Encounter for other administrative examinations: Secondary | ICD-10-CM

## 2017-08-26 DIAGNOSIS — Z6837 Body mass index (BMI) 37.0-37.9, adult: Secondary | ICD-10-CM | POA: Diagnosis not present

## 2017-08-26 DIAGNOSIS — R0602 Shortness of breath: Secondary | ICD-10-CM | POA: Diagnosis not present

## 2017-08-26 DIAGNOSIS — Z9189 Other specified personal risk factors, not elsewhere classified: Secondary | ICD-10-CM

## 2017-08-26 DIAGNOSIS — K219 Gastro-esophageal reflux disease without esophagitis: Secondary | ICD-10-CM | POA: Diagnosis not present

## 2017-08-26 DIAGNOSIS — Z1331 Encounter for screening for depression: Secondary | ICD-10-CM | POA: Diagnosis not present

## 2017-08-26 DIAGNOSIS — R5383 Other fatigue: Secondary | ICD-10-CM | POA: Diagnosis not present

## 2017-08-27 LAB — COMPREHENSIVE METABOLIC PANEL
A/G RATIO: 1.8 (ref 1.2–2.2)
ALT: 11 IU/L (ref 0–32)
AST: 16 IU/L (ref 0–40)
Albumin: 4.6 g/dL (ref 3.5–5.5)
Alkaline Phosphatase: 111 IU/L (ref 39–117)
BUN/Creatinine Ratio: 18 (ref 9–23)
BUN: 13 mg/dL (ref 6–24)
Bilirubin Total: 0.4 mg/dL (ref 0.0–1.2)
CALCIUM: 9.4 mg/dL (ref 8.7–10.2)
CO2: 24 mmol/L (ref 20–29)
CREATININE: 0.73 mg/dL (ref 0.57–1.00)
Chloride: 101 mmol/L (ref 96–106)
GFR, EST AFRICAN AMERICAN: 110 mL/min/{1.73_m2} (ref >59)
GFR, EST NON AFRICAN AMERICAN: 96 mL/min/{1.73_m2} (ref >59)
Globulin, Total: 2.5 g/dL (ref 1.5–4.5)
Glucose: 84 mg/dL (ref 65–99)
Potassium: 4.7 mmol/L (ref 3.5–5.2)
SODIUM: 141 mmol/L (ref 134–144)
TOTAL PROTEIN: 7.1 g/dL (ref 6.0–8.5)

## 2017-08-27 LAB — VITAMIN D 25 HYDROXY (VIT D DEFICIENCY, FRACTURES): Vit D, 25-Hydroxy: 14.8 ng/mL — ABNORMAL LOW (ref 30.0–100.0)

## 2017-08-27 LAB — TSH: TSH: 1.29 u[IU]/mL (ref 0.450–4.500)

## 2017-08-27 LAB — T4, FREE: FREE T4: 1.26 ng/dL (ref 0.82–1.77)

## 2017-08-27 LAB — HEMOGLOBIN A1C
Est. average glucose Bld gHb Est-mCnc: 105 mg/dL
HEMOGLOBIN A1C: 5.3 % (ref 4.8–5.6)

## 2017-08-27 LAB — FOLATE: Folate: 13.2 ng/mL (ref >3.0)

## 2017-08-27 LAB — INSULIN, RANDOM: INSULIN: 9.4 u[IU]/mL (ref 2.6–24.9)

## 2017-08-27 LAB — VITAMIN B12: Vitamin B-12: 494 pg/mL (ref 232–1245)

## 2017-08-27 LAB — T3: T3, Total: 111 ng/dL (ref 71–180)

## 2017-08-30 NOTE — Progress Notes (Signed)
Office: [phone]  /  Fax: [phone]   Dear Dr. Carollee Herter,   Thank you for referring Theresa Mathews to our clinic. The following note includes my evaluation and treatment recommendations.  HPI:   Chief Complaint: OBESITY    [name] has been referred by Ann Held, DO for consultation regarding her obesity and obesity related comorbidities.    TINISHA Mathews (MR# [id number]) is a 52 y.o. female who presents on 08/30/2017 for obesity evaluation and treatment. Current BMI is Body mass index is 37.76 kg/m.Theresa Mathews [facility] Theresa Mathews has been struggling with her weight for many years and has been unsuccessful in either losing weight, maintaining weight loss, or reaching her healthy weight goal.     [name] attended our information session and states she is currently in the action stage of change and ready to dedicate time achieving and maintaining a healthier weight. [name] is interested in becoming our patient and working on intensive lifestyle modifications including (but not limited to) diet, exercise and weight loss. [name] has been on Saxenda, but she misses doses half the time. She has been on phentermine and Qsymia in the past.    Theresa Mathews states her family eats meals together she thinks her family will eat healthier with  her her desired weight loss is 75 lbs she has been heavy most of  her life she started gaining weight in her 20's her heaviest weight ever was 238 lbs. she has significant food cravings issues  she snacks frequently in the evenings she skips meals frequently she frequently makes poor food choices she frequently eats larger portions than normal  she has binge eating behaviors she struggles with emotional eating    [name] feels her energy is lower than it should be. This has worsened with weight gain and has not worsened recently. Theresa Mathews admits to daytime somnolence and admits to waking up still tired. Patient is at risk for obstructive sleep  apnea. Patent has a history of symptoms of daytime fatigue and morning fatigue. Patient generally gets 6 hours of sleep per night, and states they generally have restful sleep. Snoring is present. Apneic episodes are not present. Epworth Sleepiness Score is 11  Dyspnea on exertion Theresa Mathews notes increasing shortness of breath with exercising and seems to be worsening over time with weight gain. She notes getting out of breath sooner with activity than she used to. This has not gotten worse recently. Theresa Mathews denies orthopnea.  GERD (gastroesophageal reflux disease) Theresa Mathews has a diagnosis of GERD and is currently stable on PPI. She denies abdominal pain, abdominal cramping or nighttime cough.  At risk for cardiovascular disease Theresa Mathews is at a higher than average risk for cardiovascular disease due to obesity. She currently denies any chest pain.  Depression Screen Theresa Mathews's Food and Mood (modified PHQ-9) score was  Depression screen PHQ 2/9 08/26/2017  Decreased Interest 1  Down, Depressed, Hopeless 1  PHQ - 2 Score 2  Altered sleeping 0  Tired, decreased energy 3  Change in appetite 2  Feeling bad or failure about yourself  1  Trouble concentrating 1  Moving slowly or fidgety/restless 1  Suicidal thoughts 0  PHQ-9 Score 10  Difficult doing work/chores Not difficult at all    ALLERGIES: No Known Allergies  MEDICATIONS: Current Outpatient Medications on File Prior to Visit  Medication Sig Dispense Refill  . citalopram (CELEXA) 10 MG tablet Take 1 tablet by mouth daily.    . Insulin Pen Needle 32G X 4 MM MISC Use  as directed once a day with Saxenda 100 each 1  . Liraglutide -Weight Management (SAXENDA) 18 MG/3ML SOPN Inject 3 mg into the skin daily. 3 mL 5  . ranitidine (ZANTAC) 75 MG tablet Take 75 mg by mouth daily at 6 (six) AM.      No current facility-administered medications on file prior to visit.     PAST MEDICAL HISTORY: Past Medical History:  Diagnosis Date  . Anxiety     . Back pain   . Depression   . Fatigue   . GERD (gastroesophageal reflux disease)   . Lactose intolerance     PAST SURGICAL HISTORY: Past Surgical History:  Procedure Laterality Date  . BLADDER SUSPENSION N/A 01/23/2015   Procedure: TRANSVAGINAL TAPE (TVT) PROCEDURE;  Surgeon: Bobbye Charleston, MD;  Location: [facility];  Service: Gynecology;  Laterality: N/A;  . CYSTOSCOPY N/A 01/23/2015   Procedure: CYSTOSCOPY;  Surgeon: Bobbye Charleston, MD;  Location: [facility];  Service: Gynecology;  Laterality: N/A;  . DILATION AND CURETTAGE OF UTERUS  2005  . ROBOTIC ASSISTED TOTAL HYSTERECTOMY WITH BILATERAL SALPINGO OOPHERECTOMY Bilateral 01/23/2015   Procedure: ROBOTIC ASSISTED TOTAL HYSTERECTOMY WITH BILATERAL SALPINGO OOPHORECTOMY;  Surgeon: Bobbye Charleston, MD;  Location: [facility];  Service: Gynecology;  Laterality: Bilateral;    SOCIAL HISTORY: Social History   Tobacco Use  . Smoking status: Former Smoker    Packs/day: 0.50    Years: 4.00    Pack years: 2.00  . Smokeless tobacco: Never Used  . Tobacco comment: smoked in college  Substance Use Topics  . Alcohol use: Yes    Alcohol/week: 3.0 oz    Types: 5 Glasses of wine per week  . Drug use: No    FAMILY HISTORY: Family History  Problem Relation Age of Onset  . Hypertension Mother   . Ovarian cancer Mother   . Heart disease Mother 67       a fib  . Obesity Mother   . Hypertension Father   . Diabetes Father   . Hyperlipidemia Father   . Obesity Father     ROS: Review of Systems  Constitutional: Positive for malaise/fatigue.  HENT: Positive for tinnitus.   Eyes:       Wear Glasses or Contacts Floaters  Respiratory: Positive for shortness of breath (on exertion). Negative for cough.   Cardiovascular: Negative for chest pain and orthopnea.       Leg Cramping  Gastrointestinal: Positive for heartburn. Negative for abdominal pain.       Negative for abdominal cramping  Musculoskeletal: Positive for back pain.        Muscle or Joint Pain  Neurological: Positive for headaches.  Psychiatric/Behavioral: Positive for depression.       Stress     PHYSICAL EXAM: Blood pressure 116/79, pulse 64, temperature (!) 97.3 F (36.3 C), temperature source Oral, height 5\' 4"  (1.626 m), weight 220 lb (99.8 kg), SpO2 100 %. Body mass index is 37.76 kg/m. Physical Exam  Constitutional: She is oriented to person, place, and time. She appears well-developed and well-nourished.  HENT:  Head: Normocephalic and atraumatic.  Nose: Nose normal.  Eyes: EOM are normal. No scleral icterus.  Neck: Normal range of motion. Neck supple. No thyromegaly present.  Cardiovascular: Normal rate and regular rhythm.  Pulmonary/Chest: Effort normal. No respiratory distress.  Abdominal: Soft. There is no tenderness.  + obesity  Musculoskeletal: Normal range of motion.  Range of Motion normal in all 4 extremities  Neurological: She is alert and oriented  to person, place, and time. Coordination normal.  Skin: Skin is warm and dry.  Psychiatric: She has a normal mood and affect. Her behavior is normal.  Vitals reviewed.   RECENT LABS AND TESTS: BMET    Component Value Date/Time   NA 141 08/26/2017 0918   K 4.7 08/26/2017 0918   CL 101 08/26/2017 0918   CO2 24 08/26/2017 0918   GLUCOSE 84 08/26/2017 0918   GLUCOSE 97 07/12/2017 1011   BUN 13 08/26/2017 0918   CREATININE 0.73 08/26/2017 0918   CALCIUM 9.4 08/26/2017 0918   GFRNONAA 96 08/26/2017 0918   GFRAA 110 08/26/2017 0918   Lab Results  Component Value Date   HGBA1C 5.3 08/26/2017   Lab Results  Component Value Date   INSULIN 9.4 08/26/2017   CBC    Component Value Date/Time   WBC 8.5 07/12/2017 1011   RBC 4.47 07/12/2017 1011   HGB 13.7 07/12/2017 1011   HCT 39.8 07/12/2017 1011   PLT 383.0 07/12/2017 1011   MCV 88.9 07/12/2017 1011   MCH 31.0 01/08/2015 0840   MCHC 34.5 07/12/2017 1011   RDW 13.7 07/12/2017 1011   LYMPHSABS 2.1 07/12/2017 1011    MONOABS 0.5 07/12/2017 1011   EOSABS 0.2 07/12/2017 1011   BASOSABS 0.1 07/12/2017 1011   Iron/TIBC/Ferritin/ %Sat No results found for: IRON, TIBC, FERRITIN, IRONPCTSAT Lipid Panel     Component Value Date/Time   CHOL 183 07/12/2017 1011   TRIG 106.0 07/12/2017 1011   HDL 49.30 07/12/2017 1011   CHOLHDL 4 07/12/2017 1011   VLDL 21.2 07/12/2017 1011   LDLCALC 113 (H) 07/12/2017 1011   Hepatic Function Panel     Component Value Date/Time   PROT 7.1 08/26/2017 0918   ALBUMIN 4.6 08/26/2017 0918   AST 16 08/26/2017 0918   ALT 11 08/26/2017 0918   ALKPHOS 111 08/26/2017 0918   BILITOT 0.4 08/26/2017 0918   BILIDIR 0.0 02/05/2011 0815      Component Value Date/Time   TSH 1.290 08/26/2017 0918   TSH 1.27 07/12/2017 1011   TSH 1.27 02/05/2011 0815    ECG  shows NSR with a rate of 70 BPM INDIRECT CALORIMETER done today shows a VO2 of 222 and a REE of 1542.  Her calculated basal metabolic rate is 7371 thus her basal metabolic rate is worse than expected.    ASSESSMENT AND PLAN: Other fatigue - Plan: EKG 12-Lead, Comprehensive metabolic panel, Hemoglobin A1c, Insulin, random, VITAMIN D 25 Hydroxy (Vit-D Deficiency, Fractures), Vitamin B12, Folate, T3, T4, free, TSH  Shortness of breath on exertion  Gastroesophageal reflux disease without esophagitis  Depression screening  At risk for heart disease  Class 2 severe obesity with serious comorbidity and body mass index (BMI) of 37.0 to 37.9 in adult, unspecified obesity type ([place])  PLAN: Fatigue Lovette was informed that her fatigue may be related to obesity, depression or many other causes. Labs will be ordered, and in the meanwhile Dorthia has agreed to work on diet, exercise and weight loss to help with fatigue. Proper sleep hygiene was discussed including the need for 7-8 hours of quality sleep each night. A sleep study was not ordered based on symptoms and Epworth score.  Dyspnea on exertion Anahla's shortness of breath  appears to be obesity related and exercise induced. She has agreed to work on weight loss and gradually increase exercise to treat her exercise induced shortness of breath. If Linsi follows our instructions and loses weight without improvement of  her shortness of breath, we will plan to refer to pulmonology. We will monitor this condition regularly. Anzal agrees to this plan.  GERD (gastroesophageal reflux disease) Chrystie will continue her medications as prescribed. She will start diet prescription and we will follow her closely.  Cardiovascular risk counseling Vergie was given extended (15 minutes) coronary artery disease prevention counseling today. She is 52 y.o. female and has risk factors for heart disease including obesity. We discussed intensive lifestyle modifications today with an emphasis on specific weight loss instructions and strategies. Pt was also informed of the importance of increasing exercise and decreasing saturated fats to help prevent heart disease.  Depression Screen Jeane had a moderately positive depression screening. Depression is commonly associated with obesity and often results in emotional eating behaviors. We will monitor this closely and work on CBT to help improve the non-hunger eating patterns. Referral to Psychology may be required if no improvement is seen as she continues in our clinic.  Obesity Zula is currently in the action stage of change and her goal is to continue with weight loss efforts. I recommend Manvir begin the structured treatment plan as follows:  She has agreed to follow the Category 2 plan Allannah has been instructed to eventually work up to a goal of 150 minutes of combined cardio and strengthening exercise per week for weight loss and overall health benefits. We discussed the following Behavioral Modification Strategies today: increasing lean protein intake, decreasing simple carbohydrates  and work on meal planning and easy cooking plans    We discussed various medication options to help Yolette with her weight loss efforts and we both agreed to [employer] for now.   She was informed of the importance of frequent follow up visits to maximize her success with intensive lifestyle modifications for her multiple health conditions. She was informed we would discuss her lab results at her next visit unless there is a critical issue that needs to be addressed sooner. Phyliss agreed to keep her next visit at the agreed upon time to discuss these results.    OBESITY BEHAVIORAL INTERVENTION VISIT  Today's visit was # 1 out of 22.  Starting weight: 220 lbs Starting date: 08/26/17 Today's weight : 220 lbs Today's date: 08/26/2017 Total lbs lost to date: 0 (Patients must lose 7 lbs in the first 6 months to continue with counseling)   ASK: We discussed the diagnosis of obesity with Jannette Fogo today and Sundy agreed to give [name] permission to discuss obesity behavioral modification therapy today.  ASSESS: Murry has the diagnosis of obesity and her BMI today is 37.74 Lorna is in the action stage of change   ADVISE: Gyselle was educated on the multiple health risks of obesity as well as the benefit of weight loss to improve her health. She was advised of the need for long term treatment and the importance of lifestyle modifications.  AGREE: Multiple dietary modification options and treatment options were discussed and  Alyssah agreed to the above obesity treatment plan.   I, Doreene Nest, am acting as transcriptionist for  Dennard Nip, MD   I have reviewed the above documentation for accuracy and completeness, and I agree with the above. -Dennard Nip, MD

## 2017-09-01 ENCOUNTER — Encounter: Payer: Self-pay | Admitting: Gastroenterology

## 2017-09-06 ENCOUNTER — Encounter: Payer: Self-pay | Admitting: Gastroenterology

## 2017-09-06 ENCOUNTER — Other Ambulatory Visit: Payer: Self-pay

## 2017-09-06 ENCOUNTER — Ambulatory Visit
Admission: RE | Admit: 2017-09-06 | Discharge: 2017-09-06 | Disposition: A | Discharge status: Home / Self Care | Payer: BLUE CROSS/BLUE SHIELD | Source: Ambulatory Visit | Attending: Family Medicine | Admitting: Family Medicine

## 2017-09-06 ENCOUNTER — Ambulatory Visit (AMBULATORY_SURGERY_CENTER): Payer: BLUE CROSS/BLUE SHIELD | Admitting: Gastroenterology

## 2017-09-06 VITALS — BP 136/62 | HR 56 | Temp 97.8°F | Resp 18 | Ht 64.0 in | Wt 226.0 lb

## 2017-09-06 DIAGNOSIS — Z1231 Encounter for screening mammogram for malignant neoplasm of breast: Secondary | ICD-10-CM | POA: Diagnosis not present

## 2017-09-06 DIAGNOSIS — Z1211 Encounter for screening for malignant neoplasm of colon: Secondary | ICD-10-CM | POA: Diagnosis present

## 2017-09-06 MED ORDER — SODIUM CHLORIDE 0.9 % IV SOLN: 500.0000 mL | Freq: Once | INTRAVENOUS | Status: DC

## 2017-09-06 NOTE — Progress Notes (Signed)
A/ox3 pleased with MAC, report to RN 

## 2017-09-06 NOTE — Progress Notes (Signed)
Pt's states no medical or surgical changes since previsit or office visit. 

## 2017-09-06 NOTE — Progress Notes (Signed)
No problems noted in the recovery room. maw 

## 2017-09-06 NOTE — Patient Instructions (Signed)
YOU HAD AN ENDOSCOPIC PROCEDURE TODAY AT [facility]:   Refer to the procedure report that was given to you for any specific questions about what was found during the examination.  If the procedure report does not answer your questions, please call your gastroenterologist to clarify.  If you requested that your care partner not be given the details of your procedure findings, then the procedure report has been included in a sealed envelope for you to review at your convenience later.  YOU SHOULD EXPECT: Some feelings of bloating in the abdomen. Passage of more gas than usual.  Walking can help get rid of the air that was put into your GI tract during the procedure and reduce the bloating. If you had a lower endoscopy (such as a colonoscopy or flexible sigmoidoscopy) you may notice spotting of blood in your stool or on the toilet paper. If you underwent a bowel prep for your procedure, you may not have a normal bowel movement for a few days.  Please Note:  You might notice some irritation and congestion in your nose or some drainage.  This is from the oxygen used during your procedure.  There is no need for concern and it should clear up in a day or so.  SYMPTOMS TO REPORT IMMEDIATELY:   Following lower endoscopy (colonoscopy or flexible sigmoidoscopy):  Excessive amounts of blood in the stool  Significant tenderness or worsening of abdominal pains  Swelling of the abdomen that is new, acute  Fever of 100F or higher    For urgent or emergent issues, a gastroenterologist can be reached at any hour by calling [phone].   DIET:  We do recommend a small meal at first, but then you may proceed to your regular diet.  Drink plenty of fluids but you should avoid alcoholic beverages for 24 hours.  ACTIVITY:  You should plan to take it easy for the rest of today and you should NOT DRIVE or use heavy machinery until tomorrow (because of the sedation medicines used during the test).     FOLLOW UP: Our staff will call the number listed on your records the next business day following your procedure to check on you and address any questions or concerns that you may have regarding the information given to you following your procedure. If we do not reach you, we will leave a message.  However, if you are feeling well and you are not experiencing any problems, there is no need to return our call.  We will assume that you have returned to your regular daily activities without incident.  If any biopsies were taken you will be contacted by phone or by letter within the next 1-3 weeks.  Please call us at [phone] if you have not heard about the biopsies in 3 weeks.    SIGNATURES/CONFIDENTIALITY: You and/or your care partner have signed paperwork which will be entered into your electronic medical record.  These signatures attest to the fact that that the information above on your After Visit Summary has been reviewed and is understood.  Full responsibility of the confidentiality of this discharge information lies with you and/or your care-partner.    Handout was given to your care partner on hemorrhoids. You may resume your current medications today. Repeat screening colonscopy in 10 years.  Earlier, if you have any new problems or if there is any change in family history. Please call if any questions or concerns.

## 2017-09-06 NOTE — Op Note (Signed)
[facility] Patient Name: Theresa Mathews Procedure Date: 09/06/2017 10:05 AM MRN: [id number] Endoscopist: [name] , MD Age: 52 Referring MD:  Date of Birth: 1965-12-04 Gender: Female Account #: [id number] Procedure:                Colonoscopy Indications:              Screening for colorectal malignant neoplasm Medicines:                Monitored Anesthesia Care Procedure:                Pre-Anesthesia Assessment:                           - Prior to the procedure, a History and Physical                            was performed, and patient medications and                            allergies were reviewed. The patient is competent.                            The risks and benefits of the procedure and the                            sedation options and risks were discussed with the                            patient. All questions were answered and informed                            consent was obtained. Patient identification and                            proposed procedure were verified by the physician                            in the procedure room. Mental Status Examination:                            alert and oriented. Prophylactic Antibiotics: The                            patient does not require prophylactic antibiotics.                            Prior Anticoagulants: The patient has taken no                            previous anticoagulant or antiplatelet agents. ASA                            Grade Assessment: I - A normal, healthy patient.  After reviewing the risks and benefits, the patient                            was deemed in satisfactory condition to undergo the                            procedure. The anesthesia plan was to use monitored                            anesthesia care (MAC). Immediately prior to                            administration of medications, the patient was   re-assessed for adequacy to receive sedatives. The                            heart rate, respiratory rate, oxygen saturations,                            blood pressure, adequacy of pulmonary ventilation,                            and response to care were monitored throughout the                            procedure. The physical status of the patient was                            re-assessed after the procedure.                           After obtaining informed consent, the colonoscope                            was passed under direct vision. Throughout the                            procedure, the patient's blood pressure, pulse, and                            oxygen saturations were monitored continuously. The                            Colonoscope was introduced through the anus and                            advanced to the 2 cm into the ileum. The                            colonoscopy was performed without difficulty. The                            patient tolerated the procedure well. The quality  of the bowel preparation was excellent. Scope In: 10:17:30 AM Scope Out: 10:32:06 AM Scope Withdrawal Time: 0 hours 8 minutes 51 seconds  Total Procedure Duration: 0 hours 14 minutes 36 seconds  Findings:                 Non-bleeding internal hemorrhoids were found during                            retroflexion. The hemorrhoids were small.                           The exam was otherwise without abnormality on                            direct and retroflexion views. Complications:            No immediate complications. Estimated Blood Loss:     Estimated blood loss: none. Impression:               - Small internal hemorrhoids.                           - Otherwise normal to TI. Recommendation:           - Patient has a contact number available for                            emergencies. The signs and symptoms of potential                             delayed complications were discussed with the                            patient. Return to normal activities tomorrow.                            Written discharge instructions were provided to the                            patient.                           - Resume previous diet.                           - Continue present medications.                           - Repeat colonoscopy in 10 years for screening                            purposes. Earlier, if she has any new problems or                            if there is any change in family history.                           -  Return to GI clinic PRN. [name], MD 09/06/2017 10:36:50 AM This report has been signed electronically.

## 2017-09-07 ENCOUNTER — Telehealth: Payer: Self-pay

## 2017-09-07 ENCOUNTER — Telehealth: Payer: Self-pay | Admitting: *Deleted

## 2017-09-07 NOTE — Telephone Encounter (Signed)
  Follow up Call-  Call back number 09/06/2017  Post procedure Call Back phone  # [phone]  Permission to leave phone message Yes  Some recent data might be hidden     Patient questions:  Do you have a fever, pain , or abdominal swelling? No. Pain Score  0 *  Have you tolerated food without any problems? Yes.    Have you been able to return to your normal activities? Yes.    Do you have any questions about your discharge instructions: Diet   No. Medications  No. Follow up visit  No.  Do you have questions or concerns about your Care? No.  Actions: * If pain score is 4 or above: No action needed, pain <4.

## 2017-09-07 NOTE — Telephone Encounter (Signed)
Left message on answering machine. 

## 2017-09-13 ENCOUNTER — Ambulatory Visit (INDEPENDENT_AMBULATORY_CARE_PROVIDER_SITE_OTHER): Payer: BLUE CROSS/BLUE SHIELD | Admitting: Psychology

## 2017-09-13 ENCOUNTER — Ambulatory Visit (INDEPENDENT_AMBULATORY_CARE_PROVIDER_SITE_OTHER): Payer: BLUE CROSS/BLUE SHIELD | Admitting: Family Medicine

## 2017-09-13 VITALS — BP 127/80 | HR 61 | Temp 98.0°F | Ht 64.0 in | Wt 218.0 lb

## 2017-09-13 DIAGNOSIS — E8881 Metabolic syndrome: Secondary | ICD-10-CM | POA: Diagnosis not present

## 2017-09-13 DIAGNOSIS — F3289 Other specified depressive episodes: Secondary | ICD-10-CM

## 2017-09-13 DIAGNOSIS — E88819 Insulin resistance, unspecified: Secondary | ICD-10-CM

## 2017-09-13 DIAGNOSIS — E559 Vitamin D deficiency, unspecified: Secondary | ICD-10-CM

## 2017-09-13 DIAGNOSIS — Z6837 Body mass index (BMI) 37.0-37.9, adult: Secondary | ICD-10-CM

## 2017-09-13 DIAGNOSIS — Z9189 Other specified personal risk factors, not elsewhere classified: Secondary | ICD-10-CM | POA: Diagnosis not present

## 2017-09-13 MED ORDER — VITAMIN D (ERGOCALCIFEROL) 1.25 MG (50000 UNIT) PO CAPS: 50000.0000 [IU] | ORAL_CAPSULE | ORAL | 0 refills | Status: DC

## 2017-09-13 NOTE — Progress Notes (Signed)
Office: [phone]  /  Fax: [phone] Date: September 13, 2017 Time Seen: 3:55pm Duration: 60 minutes Provider: Glennie Isle, PsyD Type of Session: Intake for Individual Therapy  Informed Consent: The provider's role was explained to Theresa Mathews. The provider discussed issues of confidentiality, privacy, and limits therein; anticipated course of treatment; potential risks involved with psychotherapy; voluntary nature of treatment; and the clinic's cancellation policy. In addition to written consent, verbal informed consent for psychological services was also obtained from [place] prior to initial intake interview.   Theresa Mathews was informed that information about mental health appointments will be entered in the medical record at [facility]) via Epic. Theresa Mathews agreed information may be shared with other [facility] providers as needed for coordination of care. Written consent was also provided for this provider to coordinate care with other providers at [facility].The provider also explained leaving voicemail messages and MyChart can be utilized for non-emergency reasons and limits of confidentiality related to communication via technology was discussed. Theresa Mathews was also informed the clinic is not a 24/7 crisis center and mental health emergency resources were shared and a handout was provided. Theresa Mathews verbally acknowledged understanding and also agreed to use mental health emergency resources discussed if needed.   Chief Complaint:  Theresa Mathews was referred by Dr. Dennard Nip due to depressed mood and emotional eating. Theresa Mathews reported she uses food for comfort and reward, which she noted she has done her "whole life." Before indulging, she was unable to identify thoughts or emotions as "sometimes it is a reflex." After indulging, she reported, "I feel bad" and described experiencing "should" statements." In addition, Theresa Mathews shared she attempts  portion control and identified stress and being tired as triggers to emotional eating. Furthermore, Theresa Mathews described experiencing worry as she is the primary caregiver for her husband.   HPI: Theresa Mathews shared she has tried to lose weight for "decades." She explained, "food is a thing" for her family and as a child she was told to clean her plate. Theresa Mathews reported a history of binge eating and mindless eating as a teenager and young adult.   Mental Status Examination:  Theresa Mathews arrived early for the appointment. Thus, the appointment was initiated early. She presented as appropriately dressed and groomed. Theresa Mathews appeared her stated age and demonstrated adequate orientation to time, place, person, and purpose of the appointment. She also demonstrated appropriate eye contact. No psychomotor abnormalities or behavioral peculiarities noted. Her mood was euthymic with congruent affect; however, she was observed becoming tearful when discussing emotional eating and caring for her husband. Her thought processes were logical, linear, and goal-directed. No hallucinations, delusions, bizarre thinking or behavior reported or observed. Judgment, insight, and impulse control appeared to be grossly intact. There was no evidence of paraphasias (i.e., errors in speech, gross mispronunciations, and word substitutions), repetition deficits, or disturbances in volume or prosody (i.e., rhythm and intonation). There was no evidence of attention or memory impairments. Theresa Mathews denied current suicidal and homicidal ideation, plan, and intent.  The Mini-Mental State Examination, Second Edition (MMSE-2) was administered. The MMSE-2 briefly screens for cognitive dysfunction and overall mental status and assesses different cognitive domains: orientation, registration, attention and calculation, recall, and language and praxis. Theresa Mathews received 30 out of 30 points possible on the MMSE-2, which is noted in the normal range.   Family &  Psychosocial History: Theresa Mathews shared she is married with two daughters (ages 55 and 51). She is currently employed with [employer]. Theresa Mathews  shared her parents are in their 71s and respect her eating habits. 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 Her current social support system consists of her daughters, a close friend who was previously a co-worker, and family friends.   Medical History:  Per Theresa Mathews, she does not have any "chronic problems other than weight." She denied past history of significant medical issues. Currently, she reported she takes Celexa prescribed by her gynecologist and over the counter Zyrtec. Theresa Mathews noted she had a complete hysterectomy. She denied a history of head injuries and loss of consciousness.   Mental Health History:  Theresa Mathews shared she attended family counseling eight years ago for one year. She denied a history of individual therapy and hospitalizations for psychiatric concerns. Theresa Mathews has also never seen a psychiatrist. Regarding family history, she shared her paternal grandmother likely suffered from undiagnosed anxiety. Theresa Mathews reported experiencing decreased sleep as she falls asleep late because she uses the night hours to take time for herself. She also indicated memory issues as she now has to write things down, but wonders if it is related to age and current stressors. Theresa Mathews denied a history of sexual, physical, and emotional abuse, as well as neglect. She also denied obsessions and compulsions; attention and concentration issues; mania; engagement in self-injurious behaviors; hallucinations and delusions; and history of and current suicidal and homicidal ideation, plan, and intent. Regarding substance use, Theresa Mathews shared she used to consume one to two glasses of wine three to four times a week with dinner; however, she has not consumed any alcohol since approximately two weeks ago when she started at the  clinic.   Structured Assessment Results:  The Patient Health Questionnaire-9 (PHQ-9) is a self-report measure that assesses symptoms and severity of depression over the course of the last two weeks. Theresa Mathews obtained a score of 9 suggesting mild depression. The following items were endorsed: little interest or pleasure in doing things; feeling down, depressed, or hopeless; trouble falling or staying asleep, or sleeping too much; feeling tired or having little energy; poor appetite or overeating; and feeling bad about yourself. Theresa Mathews finds the aforementioned symptoms to be somewhat difficult.   Interventions:  A chart review was conducted prior to the clinical intake interview. The MMSE-2 and PHQ-9 were dministered and a clinical intake interview was completed. Throughout session, empathic reflections and validation was provided. Continuing treatment with this provider was discussed and treatment goals were established. Psychoeducation regarding the connections between thoughts, feelings, and behaviors was provided. Psychoeducation regarding "should" statements as they relate to distorted thinking was provided. Vanna was encouraged to be mindful of times when she makes should statements over the course of the next two weeks. A handout was provided related to the connection between thoughts, feelings, and behaviors.   Provisional DSM-5 Diagnoses: 916 (F32.8) Other Specified Depressive Disorder, Emotional Eating   Plan:  Neenah expressed understanding and agreement with the initial treatment plan of care. She appears able and willing to participate as evidenced by collaboration on treatment goals, asking questions for clarification, and engagement in reciprocal conversation. The next appointment will be scheduled in two weeks. The following treatment goals were established: decreasing emotional eating and increasing effective coping skills (e.g., stress management skills).

## 2017-09-14 NOTE — Progress Notes (Signed)
Office: [phone]  /  Fax: [phone]   HPI:   Chief Complaint: OBESITY Theresa Mathews is here to discuss her progress with her obesity treatment plan. She is on the Category 2 plan and is following her eating plan approximately 90 % of the time. She states she is walking for 20 to 35 minutes 4 times per week. Theresa Mathews did well with weight loss on her category 2 plan. She held her Kirke Shaggy and did not notice an increase in hunger. Her weight is 218 lb (98.9 kg) today and has had a weight loss of 2 pounds over a period of 2 weeks since her last visit. She has lost 2 lbs since starting treatment with [name].  Vitamin D deficiency (new) Theresa Mathews has a new diagnosis of vitamin D deficiency. She is not currently taking vit D. She admits fatigue and denies nausea, vomiting or muscle weakness.  Insulin Resistance (new) Theresa Mathews has a new diagnosis of insulin resistance. Her A1c and glucose are within normal limits but her fasting insulin level >5. Although Theresa Mathews's blood glucose readings are still under good control, insulin resistance puts her at greater risk of metabolic syndrome and diabetes. She is not taking metformin currently and continues to work on diet and exercise to decrease risk of diabetes. Theresa Mathews admits polyphagia and hypoglycemia, but this improved on her diet prescription.  At risk for diabetes Theresa Mathews is at higher than average risk for developing diabetes due to her obesity and insulin resistance. She currently denies polyuria or polydipsia.  ALLERGIES: No Known Allergies  MEDICATIONS: Current Outpatient Medications on File Prior to Visit  Medication Sig Dispense Refill  . citalopram (CELEXA) 10 MG tablet Take 1 tablet by mouth daily.    . Insulin Pen Needle 32G X 4 MM MISC Use as directed once a day with Saxenda 100 each 1  . ranitidine (ZANTAC) 75 MG tablet Take 75 mg by mouth daily at 6 (six) AM.      Current Facility-Administered Medications on File Prior to Visit  Medication Dose  Route Frequency Provider Last Rate Last Dose  . 0.9 %  sodium chloride infusion  500 mL Intravenous Once [name], MD        PAST MEDICAL HISTORY: Past Medical History:  Diagnosis Date  . Anxiety   . Back pain   . Depression   . Fatigue   . GERD (gastroesophageal reflux disease)   . Lactose intolerance     PAST SURGICAL HISTORY: Past Surgical History:  Procedure Laterality Date  . BLADDER SUSPENSION N/A 01/23/2015   Procedure: TRANSVAGINAL TAPE (TVT) PROCEDURE;  Surgeon: Bobbye Charleston, MD;  Location: [facility];  Service: Gynecology;  Laterality: N/A;  . CYSTOSCOPY N/A 01/23/2015   Procedure: CYSTOSCOPY;  Surgeon: Bobbye Charleston, MD;  Location: [facility];  Service: Gynecology;  Laterality: N/A;  . DILATION AND CURETTAGE OF UTERUS  2005  . ROBOTIC ASSISTED TOTAL HYSTERECTOMY WITH BILATERAL SALPINGO OOPHERECTOMY Bilateral 01/23/2015   Procedure: ROBOTIC ASSISTED TOTAL HYSTERECTOMY WITH BILATERAL SALPINGO OOPHORECTOMY;  Surgeon: Bobbye Charleston, MD;  Location: [facility];  Service: Gynecology;  Laterality: Bilateral;    SOCIAL HISTORY: Social History   Tobacco Use  . Smoking status: Former Smoker    Packs/day: 0.50    Years: 4.00    Pack years: 2.00  . Smokeless tobacco: Never Used  . Tobacco comment: smoked in college  Substance Use Topics  . Alcohol use: Yes    Alcohol/week: 3.0 oz    Types: 5 Glasses of wine per week  .  Drug use: No    FAMILY HISTORY: Family History  Problem Relation Age of Onset  . Hypertension Mother   . Ovarian cancer Mother   . Heart disease Mother 67       a fib  . Obesity Mother   . Hypertension Father   . Diabetes Father   . Hyperlipidemia Father   . Obesity Father     ROS: Review of Systems  Constitutional: Positive for malaise/fatigue and weight loss.  Gastrointestinal: Negative for nausea and vomiting.  Genitourinary: Negative for frequency.  Musculoskeletal:       Negative for muscle weakness  Endo/Heme/Allergies: Negative  for polydipsia.       Positive for polyphagia Positive for hypoglycemia    PHYSICAL EXAM: Blood pressure 127/80, pulse 61, temperature 98 F (36.7 C), temperature source Oral, height 5\' 4"  (1.626 m), weight 218 lb (98.9 kg), SpO2 97 %. Body mass index is 37.42 kg/m. Physical Exam  Constitutional: She is oriented to person, place, and time. She appears well-developed and well-nourished.  Cardiovascular: Normal rate.  Pulmonary/Chest: Effort normal.  Musculoskeletal: Normal range of motion.  Neurological: She is oriented to person, place, and time.  Skin: Skin is warm and dry.  Psychiatric: She has a normal mood and affect. Her behavior is normal.  Vitals reviewed.   RECENT LABS AND TESTS: BMET    Component Value Date/Time   NA 141 08/26/2017 0918   K 4.7 08/26/2017 0918   CL 101 08/26/2017 0918   CO2 24 08/26/2017 0918   GLUCOSE 84 08/26/2017 0918   GLUCOSE 97 07/12/2017 1011   BUN 13 08/26/2017 0918   CREATININE 0.73 08/26/2017 0918   CALCIUM 9.4 08/26/2017 0918   GFRNONAA 96 08/26/2017 0918   GFRAA 110 08/26/2017 0918   Lab Results  Component Value Date   HGBA1C 5.3 08/26/2017   Lab Results  Component Value Date   INSULIN 9.4 08/26/2017   CBC    Component Value Date/Time   WBC 8.5 07/12/2017 1011   RBC 4.47 07/12/2017 1011   HGB 13.7 07/12/2017 1011   HCT 39.8 07/12/2017 1011   PLT 383.0 07/12/2017 1011   MCV 88.9 07/12/2017 1011   MCH 31.0 01/08/2015 0840   MCHC 34.5 07/12/2017 1011   RDW 13.7 07/12/2017 1011   LYMPHSABS 2.1 07/12/2017 1011   MONOABS 0.5 07/12/2017 1011   EOSABS 0.2 07/12/2017 1011   BASOSABS 0.1 07/12/2017 1011   Iron/TIBC/Ferritin/ %Sat No results found for: IRON, TIBC, FERRITIN, IRONPCTSAT Lipid Panel     Component Value Date/Time   CHOL 183 07/12/2017 1011   TRIG 106.0 07/12/2017 1011   HDL 49.30 07/12/2017 1011   CHOLHDL 4 07/12/2017 1011   VLDL 21.2 07/12/2017 1011   LDLCALC 113 (H) 07/12/2017 1011   Hepatic Function  Panel     Component Value Date/Time   PROT 7.1 08/26/2017 0918   ALBUMIN 4.6 08/26/2017 0918   AST 16 08/26/2017 0918   ALT 11 08/26/2017 0918   ALKPHOS 111 08/26/2017 0918   BILITOT 0.4 08/26/2017 0918   BILIDIR 0.0 02/05/2011 0815      Component Value Date/Time   TSH 1.290 08/26/2017 0918   TSH 1.27 07/12/2017 1011   TSH 1.27 02/05/2011 0815   Results for ELIANIS, FISCHBACH (MRN [id number]) as of 09/14/2017 10:21  Ref. Range 08/26/2017 09:18  Vitamin D, 25-Hydroxy Latest Ref Range: 30.0 - 100.0 ng/mL 14.8 (L)   ASSESSMENT AND PLAN: Vitamin D deficiency - Plan: Vitamin D, Ergocalciferol, (DRISDOL)  50000 units CAPS capsule  Insulin resistance  At risk for diabetes mellitus  Class 2 severe obesity with serious comorbidity and body mass index (BMI) of 37.0 to 37.9 in adult, unspecified obesity type (Theresa Mathews)  PLAN:  Vitamin D Deficiency (new) Theresa Mathews was informed that low vitamin D levels contributes to fatigue and are associated with obesity, breast, and colon cancer. She agrees to start prescription Vit D @50 ,000 IU every week. We will recheck labs in 3 months and she will follow up for routine testing of vitamin D, at least 2-3 times per year. She was informed of the risk of over-replacement of vitamin D and agrees to not increase her dose unless she discusses this with [name] first. Theresa Mathews agrees to follow up as directed.  Insulin Resistance (new) Theresa Mathews will continue to work on weight loss, exercise, and decreasing simple carbohydrates in her diet to help decrease the risk of diabetes. She was informed that eating too many simple carbohydrates or too many calories at one sitting increases the likelihood of GI side effects. Theresa Mathews agreed to follow up with [name] as directed to monitor her progress.  Diabetes risk counseling Theresa Mathews was given extended (15 minutes) diabetes prevention counseling today. She is 52 y.o. female and has risk factors for diabetes including obesity and insulin  resistance. We discussed intensive lifestyle modifications today with an emphasis on weight loss as well as increasing exercise and decreasing simple carbohydrates in her diet.  Obesity Theresa Mathews is currently in the action stage of change. As such, her goal is to continue with weight loss efforts She has agreed to follow the Category 2 plan Theresa Mathews has been instructed to work up to a goal of 150 minutes of combined cardio and strengthening exercise per week for weight loss and overall health benefits. We discussed the following Behavioral Modification Strategies today: increasing lean protein intake, decreasing simple carbohydrates  and work on meal planning and easy cooking plans  We discussed various medication options to help Theresa Mathews with her weight loss efforts and we both agreed to discontinue [name] and continue with diet.  Theresa Mathews has agreed to follow up with our clinic in 2 to 3 weeks. She was informed of the importance of frequent follow up visits to maximize her success with intensive lifestyle modifications for her multiple health conditions.   OBESITY BEHAVIORAL INTERVENTION VISIT  Today's visit was # 2 out of 22.  Starting weight: 220 lbs Starting date: 08/26/17 Today's weight : 218 lbs Today's date: 09/13/2017 Total lbs lost to date: 2 (Patients must lose 7 lbs in the first 6 months to continue with counseling)   ASK: We discussed the diagnosis of obesity with Theresa Mathews today and Theresa Mathews agreed to give [name] permission to discuss obesity behavioral modification therapy today.  ASSESS: Theresa Mathews has the diagnosis of obesity and her BMI today is 37.4 Bobi is in the action stage of change   ADVISE: Mishti was educated on the multiple health risks of obesity as well as the benefit of weight loss to improve her health. She was advised of the need for long term treatment and the importance of lifestyle modifications.  AGREE: Multiple dietary modification options and treatment  options were discussed and  Blimy agreed to the above obesity treatment plan.  I, Doreene Nest, am acting as transcriptionist for Dennard Nip, MD  I have reviewed the above documentation for accuracy and completeness, and I agree with the above. -Dennard Nip, MD

## 2017-09-22 NOTE — Progress Notes (Signed)
Office: [phone]  /  Fax: [phone]    Date: September 27, 2017 Time Seen: 8:00am Duration: 30 minutes Provider: Glennie Isle, Psy.D. Type of Session: Individual Therapy   HPI: Per the note for the initial visit with this provider on September 13, 2017, Casy reported she uses food for comfort and reward, which she noted she has done her "whole life." Before indulging, she was unable to identify thoughts or emotions as "sometimes it is a reflex." After indulging, she reported, "I feel bad" and described experiencing "should" statements." In addition, Sharry shared she attempts portion control and identified stress and being tired as triggers to emotional eating. Furthermore, Seth Bake described experiencing worry as she is the primary caregiver for her husband. She shared she has tried to lose weight for "decades." She explained, "food is a thing" for her family and as a child she was told to clean her plate. Carnetta reported a history of binge eating and mindless eating as a teenager and young adult.   Session Content: Session focused on the goals of decreasing emotional eating and increasing effective coping skills. Session was initiated with the administration of the PHQ-9. She attributed the decrease in her score to "more rest" and that things have been "quieter at work." Zarriah shared she "made" herself go to bed. Session focused on reviewing "should" statements. Daylah indicated she attempted to increase awareness and noted, "I didn't really catch many" and "maybe missed some." However, she acknowledged eating a bagel and two donuts at work on Friday and indicated she was aware of the "should" statements at the time. Moreover, psychoeducation regarding physical and emotional hunger was provided. Mikhaela discussed recent awareness of appropriate physical hunger and shared how she altered her eating plan in order to have appropriate snacks between meals. Notably, Kirat expressed concern about her ability  to use her increased awareness about "should" statements and hunger patterns once it becomes busy at work as stress from work often triggers emotional eating. Thus, the provider introduced diaphragmatic breathing and led Deola through a breathing exercise.  Aelyn was receptive to today's session as evidenced by her sharing she felt as though a "weight" was lifting as she engaged in the breathing exercise.  Structured Assessment Results: The Patient Health Questionnaire-9 (PHQ-9) is a self-report measure that assesses symptoms and severity of depression over the course of the last two weeks. Ellana obtained a score of six suggesting mild depression. Ethlyn endorsed the following symptoms: little interest or pleasure in doing things; feeling, down, depressed, or hopeless; trouble falling or staying asleep, or sleeping too much; feeling tired or having little energy; and feeling bad about yourself.  Summers finds the endorsed symptoms to be not difficult at all.    Mental Status Examination: Giannina arrived on time for the appointment. She presented as appropriately dressed and groomed. Zlaty appeared her stated age and demonstrated adequate orientation to time, place, person, and purpose of the appointment. She also demonstrated appropriate eye contact. No psychomotor abnormalities or behavioral peculiarities noted. Her mood was euthymic with congruent affect. Her thought processes were logical, linear, and goal-directed. No hallucinations, delusions, bizarre thinking or behavior reported or observed. Judgment, insight, and impulse control appeared to be grossly intact. There was no evidence of paraphasias (i.e., errors in speech, gross mispronunciations, and word substitutions), repetition deficits, or disturbances in volume or prosody (i.e., rhythm and intonation). There was no evidence of attention or memory impairments. Ajooni denied current suicidal and homicidal ideation, intent or plan.  Interventions:  Veronnica was administered the PHQ-9. Content from the last session was reviewed (I.e., "should" statements). Throughout today's session, empathic reflections and validation were provided. The provider also normalized as appropriate. Psychoeducation regarding emotional and physical hunger was provided. Francis was given a handout and encouraged to identify between now and the next appointment when she was engaging in emotional or physical hunger to further assist in increasing her understanding and frequency of the two types of hunger. Psychoeducation regarding diaphragmatic breathing as a coping skill was provided. Jenessa was given a handout with directions to assist in practicing diaphragmatic breathing at home and she was led through a breathing exercise in session. She was encouraged to practice it twice a day. Seth Bake agreed.   DSM-5 Diagnosis: 311 (F32.8) Other Specified Depressive Disorder, Emotional Eating   Plan: Jameila continues to appear able and willing to participate as evidenced by engagement in reciprocal conversation, asking questions for clarification, and engagement in the diaphragmatic breathing exercise. The next appointment will be scheduled in three weeks per Cyntia's request as she will be on vacation in two weeks. The next session will focus on reviewing learned skills and working towards established goals, which will include the introduction of mindfulness.

## 2017-09-27 ENCOUNTER — Ambulatory Visit (INDEPENDENT_AMBULATORY_CARE_PROVIDER_SITE_OTHER): Payer: BLUE CROSS/BLUE SHIELD | Admitting: Psychology

## 2017-09-27 DIAGNOSIS — F3289 Other specified depressive episodes: Secondary | ICD-10-CM

## 2017-09-29 ENCOUNTER — Ambulatory Visit (INDEPENDENT_AMBULATORY_CARE_PROVIDER_SITE_OTHER): Payer: BLUE CROSS/BLUE SHIELD | Admitting: Family Medicine

## 2017-09-29 VITALS — BP 142/85 | HR 57 | Temp 97.9°F | Ht 64.0 in | Wt 214.0 lb

## 2017-09-29 DIAGNOSIS — E559 Vitamin D deficiency, unspecified: Secondary | ICD-10-CM

## 2017-09-29 DIAGNOSIS — Z6836 Body mass index (BMI) 36.0-36.9, adult: Secondary | ICD-10-CM | POA: Diagnosis not present

## 2017-09-29 DIAGNOSIS — Z9189 Other specified personal risk factors, not elsewhere classified: Secondary | ICD-10-CM | POA: Diagnosis not present

## 2017-09-29 DIAGNOSIS — R030 Elevated blood-pressure reading, without diagnosis of hypertension: Secondary | ICD-10-CM

## 2017-09-29 MED ORDER — VITAMIN D (ERGOCALCIFEROL) 1.25 MG (50000 UNIT) PO CAPS: 50000.0000 [IU] | ORAL_CAPSULE | ORAL | 0 refills | Status: DC

## 2017-10-04 NOTE — Progress Notes (Signed)
Office: [phone]  /  Fax: [phone]   HPI:   Chief Complaint: OBESITY Theresa Mathews is here to discuss her progress with her obesity treatment plan. She is on the Category 2 plan and is following her eating plan approximately 90 % of the time. She states she is walking the dogs for 20 to 30 minutes 3 to 4 times per week. Theresa Mathews continues to do well with weight loss. She is doing better with meal planning and prepping. She is feeling somewhat deprived, but she is making choices to help with this. Her weight is 214 lb (97.1 kg) today and has had a weight loss of 4 pounds over a period of 2 weeks since her last visit. She has lost 6 lbs since starting treatment with [name].  Vitamin D deficiency Theresa Mathews has a diagnosis of vitamin D deficiency. She is stable on vit D and fatigue is improving. Theresa Mathews denies nausea, vomiting or muscle weakness.  Elevated Blood Pressure without history of hypertension Theresa Mathews is a 52 y.o. female with elevated blood pressure today. Theresa Mathews denies chest pain or headache. Theresa Mathews had a stressful day at work. Theresa Mathews blood pressure is not currently controlled.  At risk for cardiovascular disease Theresa Mathews is at a higher than average risk for cardiovascular disease due to obesity and elevated blood pressure. She currently denies any chest pain.  ALLERGIES: No Known Allergies  MEDICATIONS: Current Outpatient Medications on File Prior to Visit  Medication Sig Dispense Refill  . citalopram (CELEXA) 10 MG tablet Take 1 tablet by mouth daily.    . ranitidine (ZANTAC) 75 MG tablet Take 75 mg by mouth daily at 6 (six) AM.      Current Facility-Administered Medications on File Prior to Visit  Medication Dose Route Frequency Provider Last Rate Last Dose  . 0.9 %  sodium chloride infusion  500 mL Intravenous Once [name], MD        PAST MEDICAL HISTORY: Past Medical History:  Diagnosis Date  . Anxiety   . Back pain   . Depression   . Fatigue   .  GERD (gastroesophageal reflux disease)   . Lactose intolerance     PAST SURGICAL HISTORY: Past Surgical History:  Procedure Laterality Date  . BLADDER SUSPENSION N/A 01/23/2015   Procedure: TRANSVAGINAL TAPE (TVT) PROCEDURE;  Surgeon: Bobbye Charleston, MD;  Location: [facility];  Service: Gynecology;  Laterality: N/A;  . CYSTOSCOPY N/A 01/23/2015   Procedure: CYSTOSCOPY;  Surgeon: Bobbye Charleston, MD;  Location: [facility];  Service: Gynecology;  Laterality: N/A;  . DILATION AND CURETTAGE OF UTERUS  2005  . ROBOTIC ASSISTED TOTAL HYSTERECTOMY WITH BILATERAL SALPINGO OOPHERECTOMY Bilateral 01/23/2015   Procedure: ROBOTIC ASSISTED TOTAL HYSTERECTOMY WITH BILATERAL SALPINGO OOPHORECTOMY;  Surgeon: Bobbye Charleston, MD;  Location: [facility];  Service: Gynecology;  Laterality: Bilateral;    SOCIAL HISTORY: Social History   Tobacco Use  . Smoking status: Former Smoker    Packs/day: 0.50    Years: 4.00    Pack years: 2.00  . Smokeless tobacco: Never Used  . Tobacco comment: smoked in college  Substance Use Topics  . Alcohol use: Yes    Alcohol/week: 3.0 oz    Types: 5 Glasses of wine per week  . Drug use: No    FAMILY HISTORY: Family History  Problem Relation Age of Onset  . Hypertension Mother   . Ovarian cancer Mother   . Heart disease Mother 73       a fib  .  Obesity Mother   . Hypertension Father   . Diabetes Father   . Hyperlipidemia Father   . Obesity Father     ROS: Review of Systems  Constitutional: Positive for malaise/fatigue and weight loss.  Cardiovascular: Negative for chest pain.  Gastrointestinal: Negative for nausea and vomiting.  Musculoskeletal:       Negative for muscle weakness  Neurological: Negative for headaches.  Psychiatric/Behavioral:       Positive for stress    PHYSICAL EXAM: Blood pressure (!) 142/85, pulse (!) 57, temperature 97.9 F (36.6 C), temperature source Oral, height 5\' 4"  (1.626 m), weight 214 lb (97.1 kg), SpO2 94 %. Body mass index  is 36.73 kg/m. Physical Exam  Constitutional: She is oriented to person, place, and time. She appears well-developed and well-nourished.  Cardiovascular: Normal rate.  Pulmonary/Chest: Effort normal.  Musculoskeletal: Normal range of motion.  Neurological: She is oriented to person, place, and time.  Skin: Skin is warm and dry.  Psychiatric: She has a normal mood and affect. Her behavior is normal.  Vitals reviewed.   RECENT LABS AND TESTS: BMET    Component Value Date/Time   NA 141 08/26/2017 0918   K 4.7 08/26/2017 0918   CL 101 08/26/2017 0918   CO2 24 08/26/2017 0918   GLUCOSE 84 08/26/2017 0918   GLUCOSE 97 07/12/2017 1011   BUN 13 08/26/2017 0918   CREATININE 0.73 08/26/2017 0918   CALCIUM 9.4 08/26/2017 0918   GFRNONAA 96 08/26/2017 0918   GFRAA 110 08/26/2017 0918   Lab Results  Component Value Date   HGBA1C 5.3 08/26/2017   Lab Results  Component Value Date   INSULIN 9.4 08/26/2017   CBC    Component Value Date/Time   WBC 8.5 07/12/2017 1011   RBC 4.47 07/12/2017 1011   HGB 13.7 07/12/2017 1011   HCT 39.8 07/12/2017 1011   PLT 383.0 07/12/2017 1011   MCV 88.9 07/12/2017 1011   MCH 31.0 01/08/2015 0840   MCHC 34.5 07/12/2017 1011   RDW 13.7 07/12/2017 1011   LYMPHSABS 2.1 07/12/2017 1011   MONOABS 0.5 07/12/2017 1011   EOSABS 0.2 07/12/2017 1011   BASOSABS 0.1 07/12/2017 1011   Iron/TIBC/Ferritin/ %Sat No results found for: IRON, TIBC, FERRITIN, IRONPCTSAT Lipid Panel     Component Value Date/Time   CHOL 183 07/12/2017 1011   TRIG 106.0 07/12/2017 1011   HDL 49.30 07/12/2017 1011   CHOLHDL 4 07/12/2017 1011   VLDL 21.2 07/12/2017 1011   LDLCALC 113 (H) 07/12/2017 1011   Hepatic Function Panel     Component Value Date/Time   PROT 7.1 08/26/2017 0918   ALBUMIN 4.6 08/26/2017 0918   AST 16 08/26/2017 0918   ALT 11 08/26/2017 0918   ALKPHOS 111 08/26/2017 0918   BILITOT 0.4 08/26/2017 0918   BILIDIR 0.0 02/05/2011 0815      Component  Value Date/Time   TSH 1.290 08/26/2017 0918   TSH 1.27 07/12/2017 1011   TSH 1.27 02/05/2011 0815   Results for Theresa, Mathews (MRN [id number]) as of 10/04/2017 11:32  Ref. Range 08/26/2017 09:18  Vitamin D, 25-Hydroxy Latest Ref Range: 30.0 - 100.0 ng/mL 14.8 (L)   ASSESSMENT AND PLAN: Elevated BP without diagnosis of hypertension  Vitamin D deficiency - Plan: Vitamin D, Ergocalciferol, (DRISDOL) 50000 units CAPS capsule  At risk for heart disease  Class 2 severe obesity with serious comorbidity and body mass index (BMI) of 36.0 to 36.9 in adult, unspecified obesity type (Theresa Mathews)  PLAN:  Vitamin D Deficiency Theresa Mathews was informed that low vitamin D levels contributes to fatigue and are associated with obesity, breast, and colon cancer. She agrees to continue to take prescription Vit D @50 ,000 IU every week #4 with no refills and will follow up for routine testing of vitamin D, at least 2-3 times per year. She was informed of the risk of over-replacement of vitamin D and agrees to not increase her dose unless she discusses this with [name] first. Theresa Mathews agrees to follow up as directed.  Elevated Blood Pressure without history of hypertension We discussed sodium restriction, working on healthy weight loss, and a regular exercise program as the means to achieve improved blood pressure control. Theresa Mathews agreed with this plan and agreed to follow up as directed. We will recheck blood pressure in 2 to 3 weeks and will continue to monitor her blood pressure as well as her progress with the above lifestyle modifications. She will watch for signs of hypotension as she continues her lifestyle modifications.  Cardiovascular risk counseling Theresa Mathews was given extended (15 minutes) coronary artery disease prevention counseling today. She is 52 y.o. female and has risk factors for heart disease including obesity and elevated blood pressure. We discussed intensive lifestyle modifications today with an emphasis on  specific weight loss instructions and strategies. Pt was also informed of the importance of increasing exercise and decreasing saturated fats to help prevent heart disease.  Obesity Theresa Mathews is currently in the action stage of change. As such, her goal is to continue with weight loss efforts She has agreed to follow the Category 2 plan Theresa Mathews has been instructed to work up to a goal of 150 minutes of combined cardio and strengthening exercise per week for weight loss and overall health benefits. We discussed the following Behavioral Modification Strategies today: work on meal planning and easy cooking plans, holiday eating strategies and travel eating strategies  Theresa Mathews has agreed to follow up with our clinic in 2 to 3 weeks. She was informed of the importance of frequent follow up visits to maximize her success with intensive lifestyle modifications for her multiple health conditions.   OBESITY BEHAVIORAL INTERVENTION VISIT  Today's visit was # 3 out of 22.  Starting weight: 220 lbs Starting date: 08/26/17 Today's weight : 214 lbs Today's date: 09/29/2017 Total lbs lost to date: 6 (Patients must lose 7 lbs in the first 6 months to continue with counseling)   ASK: We discussed the diagnosis of obesity with Theresa Mathews today and Theresa Mathews agreed to give [name] permission to discuss obesity behavioral modification therapy today.  ASSESS: Theresa Mathews has the diagnosis of obesity and her BMI today is 36.72 Theresa Mathews is in the action stage of change   ADVISE: Theresa Mathews was educated on the multiple health risks of obesity as well as the benefit of weight loss to improve her health. She was advised of the need for long term treatment and the importance of lifestyle modifications.  AGREE: Multiple dietary modification options and treatment options were discussed and  Theresa Mathews agreed to the above obesity treatment plan.  I, Doreene Nest, am acting as transcriptionist for Dennard Nip, MD  I have reviewed  the above documentation for accuracy and completeness, and I agree with the above. -Dennard Nip, MD

## 2017-10-05 NOTE — Progress Notes (Signed)
Office: [phone]  /  Fax: [phone]    Date: October 18, 2017 Time Seen: 8:00am Duration: 37 minutes Provider: Glennie Isle, Psy.D. Type of Session: Individual Therapy   HPI:  Per the note for the initial visit with this provider on September 13, 2017, Shawnell reported she uses food for comfort and reward, which she noted she has done her "whole life." Before indulging, she was unable to identify thoughts or emotions as "sometimes it is a reflex." After indulging, she reported, "I feel bad" and described experiencing "should" statements." In addition, Kairee shared she attempts portion control and identified stress and being tired as triggers to emotional eating. Furthermore, Seth Bake described experiencing worry as she is the primary caregiver for her husband.She shared she has tried to lose weight for "decades." She explained, "food is a thing" for her family and as a child she was told to clean her plate. Leylany reported a history of binge eating and mindless eating as a teenager and young adult.  Session Content: Session focused on the goals of decreasing emotional eating and increasing effective coping skills. The session was initiated with the administration of the PHQ-9 and GAD-7, as well as a brief check-in. Amiliana reported she returned from vacation yesterday, and noted, "I didn't completely abandon everything." She also described making better choices even if she did not follow her meal plan. Session then focused on reviewing triggers for emotional eating. The following were identified as occurring in the past three weeks: burn out and lack of awareness. Regarding diaphragmatic breathing, Kilynn reported she "used it at work" due to feeling overwhelmed. She described it as being helpful. Remainder of session focused on mindfulness. Tate was provided with psychoeducation and a handout for future reference. She was also led through a mindfulness exercise. In response to the exercise, Dezirea  reproted, "It was kind of cool." She acknowledged it was "a little hard to stop focusing on stuff going on" in her mind. Jayliana was also informed about free applications with the mindfulness bell that can assist in her practice of mindfulness as well as serve as reminders to take a moment and engage in diaphragmatic breathing. Angelik was receptive to today's session as evidenced by her discussing the last three weeks, engagement in a mindfulness exercise, and her willingness to practice the mindfulness exercises indicated on the handout provided.   Mental Status Examination: Barrie arrived on time for the appointment. She presented as appropriately dressed and groomed. [name] appeared her stated age and demonstrated adequate orientation to time, place, person, and purpose of the appointment. She also demonstrated appropriate eye contact. No psychomotor abnormalities or behavioral peculiarities noted. Her mood was euthymic with congruent affect. Her thought processes were logical, linear, and goal-directed. No hallucinations, delusions, bizarre thinking or behavior reported or observed. Judgment, insight, and impulse control appeared to be grossly intact. There was no evidence of paraphasias (i.e., errors in speech, gross mispronunciations, and word substitutions), repetition deficits, or disturbances in volume or prosody (i.e., rhythm and intonation). There was no evidence of attention or memory impairments. Antoninette denied current suicidal and homicidal ideation, intent or plan.  Structured Assessment Results:The Patient Health Questionnaire-9 (PHQ-9) is a self-report measure that assesses symptoms and severity of depression over the course of the last two weeks. Donyea obtained a score of seven suggesting mild depression.  Maisyn finds the endorsed symptoms to be somewhat difficult. Depression screen PHQ 2/9 10/18/2017  Decreased Interest 1  Down, Depressed, Hopeless 1  PHQ - 2 Score 2  Altered sleeping 1    Tired, decreased energy 1  Change in appetite 2  Feeling bad or failure about yourself  1  Trouble concentrating 0  Moving slowly or fidgety/restless 0  Suicidal thoughts 0  PHQ-9 Score 7  Difficult doing work/chores -   The Generalized Anxiety Disorder-7 (GAD-7) is a brief self-report measure that assesses symptoms of anxiety over the course of the last two weeks. Vadis obtained a score of five suggesting mild anxiety.  GAD 7 : Generalized Anxiety Score 10/18/2017  Nervous, Anxious, on Edge 1  Control/stop worrying 1  Worry too much - different things 1  Trouble relaxing 1  Restless 0  Easily annoyed or irritable 1  Afraid - awful might happen 0  Total GAD 7 Score 5  Anxiety Difficulty Somewhat difficult   Interventions: The PHQ-9 and GAD-7 were administered for symptom monitoring. Content from the last session was reviewed (I.e., triggers for emotional eating and diaphragmatic breathing). Throughout today's session, empathic reflections and validation were provided. Psychoeducation regarding mindfulness was provided and Latrecia was led though a mindfulness exercise (I.e., A Taste of Mindfulness). Moreover, the provider normalized Shondrika's mind wandering during the mindfulness exercise.   DSM-5 Diagnosis: 311 (F32.8) Other Specified Depressive Disorder, Emotional Eating  Plan: Lashawna continues to appear able and willing to participate as evidenced by engagement in reciprocal conversation, engagement in exercises, and asking questions for clarification. The next appointment will be scheduled in two weeks. The next session will focus on reviewing learned skills and working towards established goals, including mindfulness.

## 2017-10-18 ENCOUNTER — Ambulatory Visit (INDEPENDENT_AMBULATORY_CARE_PROVIDER_SITE_OTHER): Payer: BLUE CROSS/BLUE SHIELD | Admitting: Psychology

## 2017-10-18 DIAGNOSIS — F3289 Other specified depressive episodes: Secondary | ICD-10-CM | POA: Diagnosis not present

## 2017-10-20 ENCOUNTER — Ambulatory Visit (INDEPENDENT_AMBULATORY_CARE_PROVIDER_SITE_OTHER): Payer: BLUE CROSS/BLUE SHIELD | Admitting: Family Medicine

## 2017-10-20 VITALS — BP 132/77 | HR 65 | Temp 98.2°F | Ht 64.0 in | Wt 214.0 lb

## 2017-10-20 DIAGNOSIS — E559 Vitamin D deficiency, unspecified: Secondary | ICD-10-CM

## 2017-10-20 DIAGNOSIS — Z6836 Body mass index (BMI) 36.0-36.9, adult: Secondary | ICD-10-CM | POA: Diagnosis not present

## 2017-10-20 NOTE — Progress Notes (Signed)
Office: [phone]  /  Fax: [phone]   HPI:   Chief Complaint: OBESITY Theresa Mathews is here to discuss her progress with her obesity treatment plan. She is on the Category 2 plan and is following her eating plan approximately 75 % of the time. She states she is walking the dog 20 to 30 minutes 4 times per week. Theresa Mathews did well maintaining weight while on vacation. She did some celebration eating, but she was still mindful. She is back on track now, but feels dinners are getting boring. Her weight is 214 lb (97.1 kg) today and has maintained weight over a period of 3 weeks since her last visit. She has lost 6 lbs since starting treatment with [name].  Vitamin D deficiency Theresa Mathews has a diagnosis of vitamin D deficiency. She is currently taking vit D and is not yet at goal. She denies nausea, vomiting or muscle weakness.  ALLERGIES: No Known Allergies  MEDICATIONS: Current Outpatient Medications on File Prior to Visit  Medication Sig Dispense Refill  . citalopram (CELEXA) 10 MG tablet Take 1 tablet by mouth daily.    . ranitidine (ZANTAC) 75 MG tablet Take 75 mg by mouth daily at 6 (six) AM.     . Vitamin D, Ergocalciferol, (DRISDOL) 50000 units CAPS capsule Take 1 capsule (50,000 Units total) by mouth every 7 (seven) days. 4 capsule 0   Current Facility-Administered Medications on File Prior to Visit  Medication Dose Route Frequency Provider Last Rate Last Dose  . 0.9 %  sodium chloride infusion  500 mL Intravenous Once [name], MD        PAST MEDICAL HISTORY: Past Medical History:  Diagnosis Date  . Anxiety   . Back pain   . Depression   . Fatigue   . GERD (gastroesophageal reflux disease)   . Lactose intolerance     PAST SURGICAL HISTORY: Past Surgical History:  Procedure Laterality Date  . BLADDER SUSPENSION N/A 01/23/2015   Procedure: TRANSVAGINAL TAPE (TVT) PROCEDURE;  Surgeon: Bobbye Charleston, MD;  Location: [facility];  Service: Gynecology;  Laterality: N/A;  .  CYSTOSCOPY N/A 01/23/2015   Procedure: CYSTOSCOPY;  Surgeon: Bobbye Charleston, MD;  Location: [facility];  Service: Gynecology;  Laterality: N/A;  . DILATION AND CURETTAGE OF UTERUS  2005  . ROBOTIC ASSISTED TOTAL HYSTERECTOMY WITH BILATERAL SALPINGO OOPHERECTOMY Bilateral 01/23/2015   Procedure: ROBOTIC ASSISTED TOTAL HYSTERECTOMY WITH BILATERAL SALPINGO OOPHORECTOMY;  Surgeon: Bobbye Charleston, MD;  Location: [facility];  Service: Gynecology;  Laterality: Bilateral;    SOCIAL HISTORY: Social History   Tobacco Use  . Smoking status: Former Smoker    Packs/day: 0.50    Years: 4.00    Pack years: 2.00  . Smokeless tobacco: Never Used  . Tobacco comment: smoked in college  Substance Use Topics  . Alcohol use: Yes    Alcohol/week: 3.0 oz    Types: 5 Glasses of wine per week  . Drug use: No    FAMILY HISTORY: Family History  Problem Relation Age of Onset  . Hypertension Mother   . Ovarian cancer Mother   . Heart disease Mother 50       a fib  . Obesity Mother   . Hypertension Father   . Diabetes Father   . Hyperlipidemia Father   . Obesity Father     ROS: Review of Systems  Constitutional: Negative for weight loss.  Gastrointestinal: Negative for nausea and vomiting.  Musculoskeletal:       Negative for muscle weakness  PHYSICAL EXAM: Blood pressure 132/77, pulse 65, temperature 98.2 F (36.8 C), temperature source Oral, height 5\' 4"  (1.626 m), weight 214 lb (97.1 kg), SpO2 97 %. Body mass index is 36.73 kg/m. Physical Exam  Constitutional: She is oriented to person, place, and time. She appears well-developed and well-nourished.  Cardiovascular: Normal rate.  Pulmonary/Chest: Effort normal.  Musculoskeletal: Normal range of motion.  Neurological: She is oriented to person, place, and time.  Skin: Skin is warm and dry.  Psychiatric: She has a normal mood and affect. Her behavior is normal.  Vitals reviewed.   RECENT LABS AND TESTS: BMET    Component Value  Date/Time   NA 141 08/26/2017 0918   K 4.7 08/26/2017 0918   CL 101 08/26/2017 0918   CO2 24 08/26/2017 0918   GLUCOSE 84 08/26/2017 0918   GLUCOSE 97 07/12/2017 1011   BUN 13 08/26/2017 0918   CREATININE 0.73 08/26/2017 0918   CALCIUM 9.4 08/26/2017 0918   GFRNONAA 96 08/26/2017 0918   GFRAA 110 08/26/2017 0918   Lab Results  Component Value Date   HGBA1C 5.3 08/26/2017   Lab Results  Component Value Date   INSULIN 9.4 08/26/2017   CBC    Component Value Date/Time   WBC 8.5 07/12/2017 1011   RBC 4.47 07/12/2017 1011   HGB 13.7 07/12/2017 1011   HCT 39.8 07/12/2017 1011   PLT 383.0 07/12/2017 1011   MCV 88.9 07/12/2017 1011   MCH 31.0 01/08/2015 0840   MCHC 34.5 07/12/2017 1011   RDW 13.7 07/12/2017 1011   LYMPHSABS 2.1 07/12/2017 1011   MONOABS 0.5 07/12/2017 1011   EOSABS 0.2 07/12/2017 1011   BASOSABS 0.1 07/12/2017 1011   Iron/TIBC/Ferritin/ %Sat No results found for: IRON, TIBC, FERRITIN, IRONPCTSAT Lipid Panel     Component Value Date/Time   CHOL 183 07/12/2017 1011   TRIG 106.0 07/12/2017 1011   HDL 49.30 07/12/2017 1011   CHOLHDL 4 07/12/2017 1011   VLDL 21.2 07/12/2017 1011   LDLCALC 113 (H) 07/12/2017 1011   Hepatic Function Panel     Component Value Date/Time   PROT 7.1 08/26/2017 0918   ALBUMIN 4.6 08/26/2017 0918   AST 16 08/26/2017 0918   ALT 11 08/26/2017 0918   ALKPHOS 111 08/26/2017 0918   BILITOT 0.4 08/26/2017 0918   BILIDIR 0.0 02/05/2011 0815      Component Value Date/Time   TSH 1.290 08/26/2017 0918   TSH 1.27 07/12/2017 1011   TSH 1.27 02/05/2011 0815   Results for KAWTHAR, ENNEN (MRN [id number]) as of 10/20/2017 15:06  Ref. Range 08/26/2017 09:18  Vitamin D, 25-Hydroxy Latest Ref Range: 30.0 - 100.0 ng/mL 14.8 (L)   ASSESSMENT AND PLAN: Vitamin D deficiency  Class 2 severe obesity with serious comorbidity and body mass index (BMI) of 36.0 to 36.9 in adult, unspecified obesity type ([place])  PLAN:  Vitamin D  Deficiency Theresa Mathews was informed that low vitamin D levels contributes to fatigue and are associated with obesity, breast, and colon cancer. She agrees to continue to take prescription Vit D @50 ,000 IU every week and will follow up for routine testing of vitamin D, at least 2-3 times per year. She was informed of the risk of over-replacement of vitamin D and agrees to not increase her dose unless she discusses this with [name] first. We will recheck labs in 6 weeks.  We spent > than 50% of the 15 minute visit on the counseling as documented in the note.  Obesity  Theresa Mathews is currently in the action stage of change. As such, her goal is to continue with weight loss efforts She has agreed to follow the Category 2 plan Theresa Mathews has been instructed to work up to a goal of 150 minutes of combined cardio and strengthening exercise per week for weight loss and overall health benefits. We discussed the following Behavioral Modification Strategies today: work on meal planning and easy cooking plans and dealing with family or coworker sabotage We discussed different ways to keep dinner interesting.  Nylee has agreed to follow up with our clinic in 2 to 3 weeks. She was informed of the importance of frequent follow up visits to maximize her success with intensive lifestyle modifications for her multiple health conditions.   OBESITY BEHAVIORAL INTERVENTION VISIT  Today's visit was # 4 out of 22.  Starting weight: 220 lbs Starting date: 08/26/17 Today's weight : 214 lbs Today's date: 10/20/2017 Total lbs lost to date: 6    ASK: We discussed the diagnosis of obesity with Theresa Mathews today and Theresa Mathews agreed to give [name] permission to discuss obesity behavioral modification therapy today.  ASSESS: Theresa Mathews has the diagnosis of obesity and her BMI today is 36.72 Theresa Mathews is in the action stage of change   ADVISE: Theresa Mathews was educated on the multiple health risks of obesity as well as the benefit of weight loss  to improve her health. She was advised of the need for long term treatment and the importance of lifestyle modifications.  AGREE: Multiple dietary modification options and treatment options were discussed and  Theresa Mathews agreed to the above obesity treatment plan.  I, Doreene Nest, am acting as transcriptionist for Dennard Nip, MD  I have reviewed the above documentation for accuracy and completeness, and I agree with the above. -Dennard Nip, MD

## 2017-10-25 ENCOUNTER — Encounter (INDEPENDENT_AMBULATORY_CARE_PROVIDER_SITE_OTHER): Payer: Self-pay

## 2017-10-27 NOTE — Progress Notes (Signed)
Office: [phone]  /  Fax: [phone]  Date: November 04, 2017 Time Seen: 8:00am Duration: 30 minutes Provider: Glennie Isle, Psy.D. Type of Session: Individual Therapy   HPI: Per the note for the initial visit with this provider on September 13, 2017,Theresa Mathews reported she uses food for comfort and reward, which she noted she has done her "whole life." Before indulging, she was unable to identify thoughts or emotions as "sometimes it is a reflex." After indulging, she reported, "I feel bad" and described experiencing "should" statements." In addition, Theresa Mathews shared she attempts portion control and identified stress and being tired as triggers to emotional eating. Furthermore, Theresa Mathews described experiencing worry as she is the primary caregiver for her husband.Theresa Mathews she has tried to lose weight for "decades." She explained, "food is a thing" for her family and as a child she was told to clean her plate. Theresa Mathews reported a history of binge eating and mindless eating as a teenager and young adult.  Session Content: Session focused on the goalsof decreasing emotional eating and increasing effective coping skills. The session was initiated with the administration of the PHQ-9 and GAD-7, as well as a a brief check-in. Theresa Mathews reported sleeping difficulties. She stated, "I am sleeping a lot" and described being "really tired."  This was explored further and it was identified the difficulties began after she returned from vacation. Thus, session focused on sleep hygiene. Remainder of session focused on engagement in pleasurable activities and mindfulness as they are effective coping skills and can help reduce emotional eating. The importance of self-care was also highlighted for Theresa Mathews as stress, fatigue and burnout are triggers for emotional eating.  [name] was receptive to today's session as evidenced by engagement in reciprocal conversation, discussing struggles in the past two weeks, and demonstration of  willingness to engage in discussed activities. She also shared she engaged in the mindfulness activity from the last appointment.   Mental Status Examination: Theresa Mathews arrived on time for the appointment. She presented as appropriately dressed and groomed. Theresa Mathews appeared her stated age and demonstrated adequate orientation to time, place, person, and purpose of the appointment. She also demonstrated appropriate eye contact. No psychomotor abnormalities or behavioral peculiarities noted. Her mood was euthymic with congruent affect. Her thought processes were logical, linear, and goal-directed. No hallucinations, delusions, bizarre thinking or behavior reported or observed. Judgment, insight, and impulse control appeared to be grossly intact. There was no evidence of paraphasias (i.e., errors in speech, gross mispronunciations, and word substitutions), repetition deficits, or disturbances in volume or prosody (i.e., rhythm and intonation). There was no evidence of attention or memory impairments. Theresa Mathews denied current suicidal and homicidal ideation, intent or plan.  Structured Assessment Results:The Patient Health Questionnaire-9 (PHQ-9) is a self-report measure that assesses symptoms and severity of depression over the course of the last two weeks. Theresa Mathews obtained a score of 8 suggesting mild depression . Theresa Mathews finds the endorsed symptoms to be somewhat difficult. Depression screen PHQ 2/9 11/04/2017  Decreased Interest 1  Down, Depressed, Hopeless 1  PHQ - 2 Score 2  Altered sleeping 1  Tired, decreased energy 2  Change in appetite 1  Feeling bad or failure about yourself  1  Trouble concentrating 1  Moving slowly or fidgety/restless 0  Suicidal thoughts 0  PHQ-9 Score 8  Difficult doing work/chores -   The Generalized Anxiety Disorder-7 (GAD-7) is a brief self-report measure that assesses symptoms of anxiety over the course of the last two weeks. Theresa Mathews obtained a score of 6  suggesting mild  anxiety.  GAD 7 : Generalized Anxiety Score 11/04/2017 10/18/2017  Nervous, Anxious, on Edge 1 1  Control/stop worrying 1 1  Worry too much - different things 1 1  Trouble relaxing 1 1  Restless 1 0  Easily annoyed or irritable 1 1  Afraid - awful might happen 0 0  Total GAD 7 Score 6 5  Anxiety Difficulty Somewhat difficult Somewhat difficult   Interventions: Theresa Mathews was administered the PHQ-9 and GAD-7 for symptom monitoring. Content from the last session was reviewed (I.e., mindfulness). Throughout today's session, empathic reflections and validation were provided. Psychoeducation regarding sleep hygiene was provided. Theresa Mathews was given a handout. Psychoeducation regarding pleasurable activities was also provided and Theresa Mathews was given a handout. Moreover, the provider discussed the application of mindfulness when engaging in pleasurable activities. The impact of engagement in mindfulness and pleasurable activities on emotional eating was also highlighted for Theresa Mathews. Furthermore, she was given the handout with the mindfulness script utilized during the last appointment. She was encouraged to record her voice or have someone read the script to her when engaging in the exercise. Theresa Mathews agreed.   DSM-5 Diagnosis: 311 (F32.8) Other Specified Depressive Disorder, Emotional Eating  Plan: Theresa Mathews continues to appear able and willing to participate as evidenced by engagement in reciprocal conversation, and asking questions for clarification as appropriate. The next appointment will be scheduled in two weeks. The next session will focus on reviewing learned skills, and working towards established treatment goals. The next session will also focus on termination planning.

## 2017-11-01 ENCOUNTER — Ambulatory Visit (INDEPENDENT_AMBULATORY_CARE_PROVIDER_SITE_OTHER): Payer: Self-pay | Admitting: Physician Assistant

## 2017-11-03 ENCOUNTER — Ambulatory Visit (INDEPENDENT_AMBULATORY_CARE_PROVIDER_SITE_OTHER): Payer: Self-pay | Admitting: Family Medicine

## 2017-11-04 ENCOUNTER — Ambulatory Visit (INDEPENDENT_AMBULATORY_CARE_PROVIDER_SITE_OTHER): Payer: BLUE CROSS/BLUE SHIELD | Admitting: Psychology

## 2017-11-04 DIAGNOSIS — F3289 Other specified depressive episodes: Secondary | ICD-10-CM

## 2017-11-04 NOTE — Progress Notes (Signed)
Office: [phone]  /  Fax: [phone]   Date: November 18, 2017 Time Seen: 8:05am Duration: 30 minutes Provider: Glennie Isle, Psy.D. Type of Session: Individual Therapy   HPI: Per the note for the initial visit with this provider on September 13, 2017,Pasha reported she uses food for comfort and reward, which she noted she has done her "whole life." Before indulging, she was unable to identify thoughts or emotions as "sometimes it is a reflex." After indulging, she reported, "I feel bad" and described experiencing "should" statements." In addition, Makenze shared she attempts portion control and identified stress and being tired as triggers to emotional eating. Furthermore, Seth Bake described experiencing worry as she is the primary caregiver for her husband.Sheshared she has tried to lose weight for "decades." She explained, "food is a thing" for her family and as a child she was told to clean her plate. Haven reported a history of binge eating and mindless eating as a teenager and young adult.  Session Content: Session focused on the goalsof decreasing emotional eating and increasing effective coping skills. The session was initiated with the administration of the PHQ-9 and GAD-7, as well as a a brief check-in. Eilish reported, "I think trying to work on the sleep and rest part and not beating myself has helped." She shared she is working on establishing a night time routine, which includes light stretching and mindfulness. In addition, Nandini described grazing when she is tired, but noted, "I am not sure how that is connected to being tired." Thus, the provider reviewed triggers for emotional eating and physical versus emotional hunger to assist Victoriana in the connection. The connection between thoughts, feelings, and behaviors was also reviewed prior to the introduction of thought defusion. Psychoeducation regarding thought defusion was provided, and Dailey was led through an exercise. She  indicated that as the exercise progressed, the thought (I.e., "I am lazy.") "became less negative." Maisyn was given a handout with various thought defusion exercises to practice. Remainder of session focused on termination planning. Kilee agreed to meet with this provider for one last appointment, and was receptive to the idea of a referral for continued therapeutic services. This will be explored further during the last appointment. This provider explored what Dorsey would feel would be the most helpful for the final session. Sh reported, "Other types of exercises you can give me for my tool box."   Hodan was receptive to today's session as evidenced by her sharing about recent events, discussing engagement in mindfulness, and willingness to practice thought defusion at home. Due to Janika's continued practice of mindfulness, a script for an exercise was provided to Pendleton.   Mental Status Examination: Lanett arrived on time for the appointment; however, the appointment was initiated late due to a delay in the check-in process. She presented as appropriately dressed and groomed. Tristina appeared her stated age and demonstrated adequate orientation to time, place, person, and purpose of the appointment. She also demonstrated appropriate eye contact. No psychomotor abnormalities or behavioral peculiarities noted. Her mood was euthymic with congruent affect. Her thought processes were logical, linear, and goal-directed. No hallucinations, delusions, bizarre thinking or behavior reported or observed. Judgment, insight, and impulse control appeared to be grossly intact. There was no evidence of paraphasias (i.e., errors in speech, gross mispronunciations, and word substitutions), repetition deficits, or disturbances in volume or prosody (i.e., rhythm and intonation). There was no evidence of attention or memory impairments. Sheritha denied current suicidal and homicidal ideation, intent or plan.  Structured  Assessment Results: The Patient Health Questionnaire-9 (PHQ-9) is a self-report measure that assesses symptoms and severity of depression over the course of the last two weeks. Donna obtained a score of seven suggesting mild depression. Tierra finds the endorsed symptoms to be somewhat difficult.  Depression screen PHQ 2/9 11/18/2017  Decreased Interest 1  Down, Depressed, Hopeless 1  PHQ - 2 Score 2  Altered sleeping 1  Tired, decreased energy 1  Change in appetite 1  Feeling bad or failure about yourself  1  Trouble concentrating 1  Moving slowly or fidgety/restless 0  Suicidal thoughts 0  PHQ-9 Score 7  Difficult doing work/chores -   The Generalized Anxiety Disorder-7 (GAD-7) is a brief self-report measure that assesses symptoms of anxiety over the course of the last two weeks. Clytee obtained a score of four suggesting minimal anxiety.  GAD 7 : Generalized Anxiety Score 11/18/2017  Nervous, Anxious, on Edge 1  Control/stop worrying 0  Worry too much - different things 0  Trouble relaxing 1  Restless 1  Easily annoyed or irritable 1  Afraid - awful might happen 0  Total GAD 7 Score 4  Anxiety Difficulty Not difficult at all   Interventions: Jourdin was administered the PHQ-9 and GAD-7 for symptom monitoring. Content from the last session was reviewed. Throughout today's session, empathic reflections and validation were provided. Mindfulness; triggers for emotional eating; physical versus emotional hunger; and the connection between thoughts, feelings, and behaviors was briefly reviewed. Psychoeducation regarding thought defusion was provided and Alyiah was led through an exercise. Termination was discussed.   DSM-5 Diagnosis: 311 (F32.8) Other Specified Depressive Disorder, Emotional Eating  Plan: Lesleigh continues to appear able and willing to participate as evidenced by engagement in reciprocal conversation, and asking questions for clarification as appropriate. The next appointment  will be scheduled in two weeks. The next session will focus on reviewing learned skills, and termination.

## 2017-11-10 ENCOUNTER — Ambulatory Visit (INDEPENDENT_AMBULATORY_CARE_PROVIDER_SITE_OTHER): Payer: BLUE CROSS/BLUE SHIELD | Admitting: Family Medicine

## 2017-11-10 VITALS — BP 147/83 | HR 62 | Temp 97.9°F | Ht 64.0 in | Wt 215.0 lb

## 2017-11-10 DIAGNOSIS — R030 Elevated blood-pressure reading, without diagnosis of hypertension: Secondary | ICD-10-CM

## 2017-11-10 DIAGNOSIS — Z6837 Body mass index (BMI) 37.0-37.9, adult: Secondary | ICD-10-CM

## 2017-11-10 NOTE — Progress Notes (Signed)
Office: [phone]  /  Fax: [phone]   HPI:   Chief Complaint: OBESITY Theresa Mathews is here to discuss her progress with her obesity treatment plan. She is on the Category 2 plan and is following her eating plan approximately 75 % of the time. She states she is walking the dog for 25 minutes 3-4 times per week. Theresa Mathews has been off track the last 2 weeks but states she is ready to get back on track. She is getting bored with breakfast and would like more options.  Her weight is 215 lb (97.5 kg) today and has gained 1 pound since her last visit. She has lost 5 lbs since starting treatment with [name].  Elevated Blood Pressure without History of Hypertension Theresa Mathews is a 52 y.o. female with elevated blood pressure with no history of hypertension. Theresa Mathews is not on medications but she states increased work stress. She denies chest pain. She is working weight loss to help control her blood pressure with the goal of decreasing her risk of heart attack and stroke. Theresa Mathews blood pressure is not currently controlled.  ALLERGIES: No Known Allergies  MEDICATIONS: Current Outpatient Medications on File Prior to Visit  Medication Sig Dispense Refill  . citalopram (CELEXA) 10 MG tablet Take 1 tablet by mouth daily.    . ranitidine (ZANTAC) 75 MG tablet Take 75 mg by mouth daily at 6 (six) AM.     . Vitamin D, Ergocalciferol, (DRISDOL) 50000 units CAPS capsule Take 1 capsule (50,000 Units total) by mouth every 7 (seven) days. 4 capsule 0   Current Facility-Administered Medications on File Prior to Visit  Medication Dose Route Frequency Provider Last Rate Last Dose  . 0.9 %  sodium chloride infusion  500 mL Intravenous Once [name], MD        PAST MEDICAL HISTORY: Past Medical History:  Diagnosis Date  . Anxiety   . Back pain   . Depression   . Fatigue   . GERD (gastroesophageal reflux disease)   . Lactose intolerance     PAST SURGICAL HISTORY: Past Surgical History:    Procedure Laterality Date  . BLADDER SUSPENSION N/A 01/23/2015   Procedure: TRANSVAGINAL TAPE (TVT) PROCEDURE;  Surgeon: Bobbye Charleston, MD;  Location: [facility];  Service: Gynecology;  Laterality: N/A;  . CYSTOSCOPY N/A 01/23/2015   Procedure: CYSTOSCOPY;  Surgeon: Bobbye Charleston, MD;  Location: [facility];  Service: Gynecology;  Laterality: N/A;  . DILATION AND CURETTAGE OF UTERUS  2005  . ROBOTIC ASSISTED TOTAL HYSTERECTOMY WITH BILATERAL SALPINGO OOPHERECTOMY Bilateral 01/23/2015   Procedure: ROBOTIC ASSISTED TOTAL HYSTERECTOMY WITH BILATERAL SALPINGO OOPHORECTOMY;  Surgeon: Bobbye Charleston, MD;  Location: [facility];  Service: Gynecology;  Laterality: Bilateral;    SOCIAL HISTORY: Social History   Tobacco Use  . Smoking status: Former Smoker    Packs/day: 0.50    Years: 4.00    Pack years: 2.00  . Smokeless tobacco: Never Used  . Tobacco comment: smoked in college  Substance Use Topics  . Alcohol use: Yes    Alcohol/week: 5.0 standard drinks    Types: 5 Glasses of wine per week  . Drug use: No    FAMILY HISTORY: Family History  Problem Relation Age of Onset  . Hypertension Mother   . Ovarian cancer Mother   . Heart disease Mother 52       a fib  . Obesity Mother   . Hypertension Father   . Diabetes Father   . Hyperlipidemia Father   .  Obesity Father     ROS: Review of Systems  Constitutional: Negative for weight loss.  Cardiovascular: Negative for chest pain.    PHYSICAL EXAM: Blood pressure (!) 147/83, pulse 62, temperature 97.9 F (36.6 C), temperature source Oral, height 5\' 4"  (1.626 m), weight 215 lb (97.5 kg), SpO2 98 %. Body mass index is 36.9 kg/m. Physical Exam  Constitutional: She is oriented to person, place, and time. She appears well-developed and well-nourished.  Cardiovascular: Normal rate.  Pulmonary/Chest: Effort normal.  Musculoskeletal: Normal range of motion.  Neurological: She is oriented to person, place, and time.  Skin: Skin is warm  and dry.  Psychiatric: She has a normal mood and affect. Her behavior is normal.  Vitals reviewed.   RECENT LABS AND TESTS: BMET    Component Value Date/Time   NA 141 08/26/2017 0918   K 4.7 08/26/2017 0918   CL 101 08/26/2017 0918   CO2 24 08/26/2017 0918   GLUCOSE 84 08/26/2017 0918   GLUCOSE 97 07/12/2017 1011   BUN 13 08/26/2017 0918   CREATININE 0.73 08/26/2017 0918   CALCIUM 9.4 08/26/2017 0918   GFRNONAA 96 08/26/2017 0918   GFRAA 110 08/26/2017 0918   Lab Results  Component Value Date   HGBA1C 5.3 08/26/2017   Lab Results  Component Value Date   INSULIN 9.4 08/26/2017   CBC    Component Value Date/Time   WBC 8.5 07/12/2017 1011   RBC 4.47 07/12/2017 1011   HGB 13.7 07/12/2017 1011   HCT 39.8 07/12/2017 1011   PLT 383.0 07/12/2017 1011   MCV 88.9 07/12/2017 1011   MCH 31.0 01/08/2015 0840   MCHC 34.5 07/12/2017 1011   RDW 13.7 07/12/2017 1011   LYMPHSABS 2.1 07/12/2017 1011   MONOABS 0.5 07/12/2017 1011   EOSABS 0.2 07/12/2017 1011   BASOSABS 0.1 07/12/2017 1011   Iron/TIBC/Ferritin/ %Sat No results found for: IRON, TIBC, FERRITIN, IRONPCTSAT Lipid Panel     Component Value Date/Time   CHOL 183 07/12/2017 1011   TRIG 106.0 07/12/2017 1011   HDL 49.30 07/12/2017 1011   CHOLHDL 4 07/12/2017 1011   VLDL 21.2 07/12/2017 1011   LDLCALC 113 (H) 07/12/2017 1011   Hepatic Function Panel     Component Value Date/Time   PROT 7.1 08/26/2017 0918   ALBUMIN 4.6 08/26/2017 0918   AST 16 08/26/2017 0918   ALT 11 08/26/2017 0918   ALKPHOS 111 08/26/2017 0918   BILITOT 0.4 08/26/2017 0918   BILIDIR 0.0 02/05/2011 0815      Component Value Date/Time   TSH 1.290 08/26/2017 0918   TSH 1.27 07/12/2017 1011   TSH 1.27 02/05/2011 0815    ASSESSMENT AND PLAN: Blood pressure elevated without history of HTN  Class 2 severe obesity with serious comorbidity and body mass index (BMI) of 37.0 to 37.9 in adult, unspecified obesity type (HCC)  PLAN:  Elevated  Blood Pressure without History of Hypertension We discussed sodium restriction, working on healthy weight loss, and a regular exercise program as the means to achieve improved blood pressure control. Kayte agreed with this plan and agreed to follow up as directed. We will continue to monitor her blood pressure as well as her progress with the above lifestyle modifications. She will watch for signs of hypotension as she continues her lifestyle modifications. Theresa Mathews agrees to follow up with our clinic in 2 to 3 weeks and we will recheck blood pressure at that time.  We spent > than 50% of the 15 minute  visit on the counseling as documented in the note.  Obesity Theresa Mathews is currently in the action stage of change. As such, her goal is to continue with weight loss efforts She has agreed to follow the Category 2 plan + breakfast options Theresa Mathews has been instructed to work up to a goal of 150 minutes of combined cardio and strengthening exercise per week for weight loss and overall health benefits. We discussed the following Behavioral Modification Strategies today: increasing lean protein intake, decreasing simple carbohydrates  and work on meal planning and easy cooking plans   Theresa Mathews has agreed to follow up with our clinic in 2 to 3 weeks. She was informed of the importance of frequent follow up visits to maximize her success with intensive lifestyle modifications for her multiple health conditions.   OBESITY BEHAVIORAL INTERVENTION VISIT  Today's visit was # 8 out of 22.  Starting weight: 220 lbs Starting date: 08/26/17 Today's weight : 215 lbs  Today's date: 11/10/2017 Total lbs lost to date: 5    ASK: We discussed the diagnosis of obesity with Theresa Mathews today and Theresa Mathews agreed to give [name] permission to discuss obesity behavioral modification therapy today.  ASSESS: Theresa Mathews has the diagnosis of obesity and her BMI today is 36.89 Theresa Mathews is in the action stage of change    ADVISE: Theresa Mathews was educated on the multiple health risks of obesity as well as the benefit of weight loss to improve her health. She was advised of the need for long term treatment and the importance of lifestyle modifications.  AGREE: Multiple dietary modification options and treatment options were discussed and  Theresa Mathews agreed to the above obesity treatment plan.  I, Theresa Mathews, am acting as transcriptionist for Dennard Nip, MD  I have reviewed the above documentation for accuracy and completeness, and I agree with the above. -Dennard Nip, MD

## 2017-11-18 ENCOUNTER — Ambulatory Visit (INDEPENDENT_AMBULATORY_CARE_PROVIDER_SITE_OTHER): Payer: BLUE CROSS/BLUE SHIELD | Admitting: Psychology

## 2017-11-18 DIAGNOSIS — F3289 Other specified depressive episodes: Secondary | ICD-10-CM

## 2017-11-24 ENCOUNTER — Encounter (INDEPENDENT_AMBULATORY_CARE_PROVIDER_SITE_OTHER): Payer: Self-pay | Admitting: Bariatrics

## 2017-11-24 ENCOUNTER — Ambulatory Visit (INDEPENDENT_AMBULATORY_CARE_PROVIDER_SITE_OTHER): Payer: BLUE CROSS/BLUE SHIELD | Admitting: Bariatrics

## 2017-11-24 VITALS — BP 127/85 | HR 61 | Temp 97.5°F | Ht 64.0 in | Wt 213.0 lb

## 2017-11-24 DIAGNOSIS — E559 Vitamin D deficiency, unspecified: Secondary | ICD-10-CM | POA: Diagnosis not present

## 2017-11-24 DIAGNOSIS — F3289 Other specified depressive episodes: Secondary | ICD-10-CM | POA: Diagnosis not present

## 2017-11-24 DIAGNOSIS — Z6836 Body mass index (BMI) 36.0-36.9, adult: Secondary | ICD-10-CM

## 2017-11-24 DIAGNOSIS — Z9189 Other specified personal risk factors, not elsewhere classified: Secondary | ICD-10-CM

## 2017-11-24 MED ORDER — VITAMIN D (ERGOCALCIFEROL) 1.25 MG (50000 UNIT) PO CAPS: 50000.0000 [IU] | ORAL_CAPSULE | ORAL | 0 refills | Status: DC

## 2017-11-24 MED ORDER — BUPROPION HCL ER (SR) 150 MG PO TB12: 150.0000 mg | ORAL_TABLET | Freq: Every day | ORAL | 0 refills | Status: DC

## 2017-11-24 NOTE — Progress Notes (Signed)
Office: [phone]  /  Fax: [phone]   HPI:   Chief Complaint: OBESITY Theresa Mathews is here to discuss her progress with her obesity treatment plan. She is on the  follow the Category 2 plan with breakfast options and is following her eating plan approximately 80 % of the time. She states she is exercising 25 minutes 4 times per week. Theresa Mathews is currently struggling with increased cravings for chocolate in the afternoon and night time. She has an increase of stress and is meeting with Dr. Mallie Mussel.  Her weight is 213 lb (96.6 kg) today and has had a weight loss of 2 pounds over a period of 2 weeks since her last visit. She has lost 7 lbs since starting treatment with [name].  Vitamin D deficiency Theresa Mathews has a diagnosis of vitamin D deficiency. She is currently taking vit D and denies nausea, vomiting or muscle weakness.   Ref. Range 08/26/2017 09:18  Vitamin D, 25-Hydroxy Latest Ref Range: 30.0 - 100.0 ng/mL 14.8 (L)   Depression with emotional eating behaviors (Stress eating) Theresa Mathews is struggling with emotional eating and using food for comfort to the extent that it is negatively impacting her health. She often snacks when she is not hungry. Theresa Mathews sometimes feels she is out of control and then feels guilty that she made poor food choices. She has been working on behavior modification techniques to help reduce her emotional eating and has been unsuccessful. She denies history of seizures or other absolute or relative contraindications to starting Wellbutrin. She shows no sign of suicidal or homicidal ideations.  At risk for cardiovascular disease Theresa Mathews is at a higher than average risk for cardiovascular disease due to obesity. She currently denies any chest pain.   Depression screen [facility] 2/9 11/18/2017 11/04/2017 10/18/2017 09/13/2017 08/26/2017  Decreased Interest 1 1 1 1 1   Down, Depressed, Hopeless 1 1 1 1 1   PHQ - 2 Score 2 2 2 2 2   Altered sleeping 1 1 1 2  0  Tired, decreased energy 1 2 1 3 3    Change in appetite 1 1 2 1 2   Feeling bad or failure about yourself  1 1 1 1 1   Trouble concentrating 1 1 0 0 1  Moving slowly or fidgety/restless 0 0 0 0 1  Suicidal thoughts 0 0 0 0 0  PHQ-9 Score 7 8 7 9 10   Difficult doing work/chores - - - - Not difficult at all    ALLERGIES: No Known Allergies  MEDICATIONS: Current Outpatient Medications on File Prior to Visit  Medication Sig Dispense Refill  . citalopram (CELEXA) 10 MG tablet Take 1 tablet by mouth daily.    . ranitidine (ZANTAC) 75 MG tablet Take 75 mg by mouth daily at 6 (six) AM.      Current Facility-Administered Medications on File Prior to Visit  Medication Dose Route Frequency Provider Last Rate Last Dose  . 0.9 %  sodium chloride infusion  500 mL Intravenous Once [name], MD        PAST MEDICAL HISTORY: Past Medical History:  Diagnosis Date  . Anxiety   . Back pain   . Depression   . Fatigue   . GERD (gastroesophageal reflux disease)   . Lactose intolerance     PAST SURGICAL HISTORY: Past Surgical History:  Procedure Laterality Date  . BLADDER SUSPENSION N/A 01/23/2015   Procedure: TRANSVAGINAL TAPE (TVT) PROCEDURE;  Surgeon: Bobbye Charleston, MD;  Location: [facility];  Service: Gynecology;  Laterality: N/A;  .  CYSTOSCOPY N/A 01/23/2015   Procedure: CYSTOSCOPY;  Surgeon: Bobbye Charleston, MD;  Location: [facility];  Service: Gynecology;  Laterality: N/A;  . DILATION AND CURETTAGE OF UTERUS  2005  . ROBOTIC ASSISTED TOTAL HYSTERECTOMY WITH BILATERAL SALPINGO OOPHERECTOMY Bilateral 01/23/2015   Procedure: ROBOTIC ASSISTED TOTAL HYSTERECTOMY WITH BILATERAL SALPINGO OOPHORECTOMY;  Surgeon: Bobbye Charleston, MD;  Location: [facility];  Service: Gynecology;  Laterality: Bilateral;    SOCIAL HISTORY: Social History   Tobacco Use  . Smoking status: Former Smoker    Packs/day: 0.50    Years: 4.00    Pack years: 2.00  . Smokeless tobacco: Never Used  . Tobacco comment: smoked in college  Substance Use Topics    . Alcohol use: Yes    Alcohol/week: 5.0 standard drinks    Types: 5 Glasses of wine per week  . Drug use: No    FAMILY HISTORY: Family History  Problem Relation Age of Onset  . Hypertension Mother   . Ovarian cancer Mother   . Heart disease Mother 50       a fib  . Obesity Mother   . Hypertension Father   . Diabetes Father   . Hyperlipidemia Father   . Obesity Father     ROS: Review of Systems  Constitutional: Positive for weight loss.  Cardiovascular: Negative for chest pain.  Gastrointestinal: Negative for nausea and vomiting.  Musculoskeletal:       Negative for muscle weakness.   Psychiatric/Behavioral: Positive for depression. Negative for suicidal ideas.       Negative for homicidal ideations.    PHYSICAL EXAM: Blood pressure 127/85, pulse 61, temperature (!) 97.5 F (36.4 C), temperature source Oral, height 5\' 4"  (1.626 m), weight 213 lb (96.6 kg), SpO2 100 %. Body mass index is 36.56 kg/m. Physical Exam  Constitutional: She is oriented to person, place, and time. She appears well-developed and well-nourished.  HENT:  Head: Normocephalic.  Eyes: Pupils are equal, round, and reactive to light. EOM are normal.  Neck: Normal range of motion.  Cardiovascular: Normal rate.  Pulmonary/Chest: Effort normal.  Musculoskeletal: Normal range of motion.  Neurological: She is alert and oriented to person, place, and time.  Skin: Skin is warm and dry.  Psychiatric: She has a normal mood and affect. Her behavior is normal.  Vitals reviewed.   RECENT LABS AND TESTS: BMET    Component Value Date/Time   NA 141 08/26/2017 0918   K 4.7 08/26/2017 0918   CL 101 08/26/2017 0918   CO2 24 08/26/2017 0918   GLUCOSE 84 08/26/2017 0918   GLUCOSE 97 07/12/2017 1011   BUN 13 08/26/2017 0918   CREATININE 0.73 08/26/2017 0918   CALCIUM 9.4 08/26/2017 0918   GFRNONAA 96 08/26/2017 0918   GFRAA 110 08/26/2017 0918   Lab Results  Component Value Date   HGBA1C 5.3  08/26/2017   Lab Results  Component Value Date   INSULIN 9.4 08/26/2017   CBC    Component Value Date/Time   WBC 8.5 07/12/2017 1011   RBC 4.47 07/12/2017 1011   HGB 13.7 07/12/2017 1011   HCT 39.8 07/12/2017 1011   PLT 383.0 07/12/2017 1011   MCV 88.9 07/12/2017 1011   MCH 31.0 01/08/2015 0840   MCHC 34.5 07/12/2017 1011   RDW 13.7 07/12/2017 1011   LYMPHSABS 2.1 07/12/2017 1011   MONOABS 0.5 07/12/2017 1011   EOSABS 0.2 07/12/2017 1011   BASOSABS 0.1 07/12/2017 1011   Iron/TIBC/Ferritin/ %Sat No results found for: IRON, TIBC,  FERRITIN, IRONPCTSAT Lipid Panel     Component Value Date/Time   CHOL 183 07/12/2017 1011   TRIG 106.0 07/12/2017 1011   HDL 49.30 07/12/2017 1011   CHOLHDL 4 07/12/2017 1011   VLDL 21.2 07/12/2017 1011   LDLCALC 113 (H) 07/12/2017 1011   Hepatic Function Panel     Component Value Date/Time   PROT 7.1 08/26/2017 0918   ALBUMIN 4.6 08/26/2017 0918   AST 16 08/26/2017 0918   ALT 11 08/26/2017 0918   ALKPHOS 111 08/26/2017 0918   BILITOT 0.4 08/26/2017 0918   BILIDIR 0.0 02/05/2011 0815      Component Value Date/Time   TSH 1.290 08/26/2017 0918   TSH 1.27 07/12/2017 1011   TSH 1.27 02/05/2011 0815     Ref. Range 08/26/2017 09:18  Vitamin D, 25-Hydroxy Latest Ref Range: 30.0 - 100.0 ng/mL 14.8 (L)   ASSESSMENT AND PLAN: Vitamin D deficiency - Plan: Comprehensive metabolic panel, Hemoglobin A1c, Insulin, random, VITAMIN D 25 Hydroxy (Vit-D Deficiency, Fractures), Vitamin D, Ergocalciferol, (DRISDOL) 50000 units CAPS capsule  Other depression - with emotional eating - Plan: buPROPion (WELLBUTRIN SR) 150 MG 12 hr tablet  At risk for heart disease  Class 2 severe obesity with serious comorbidity and body mass index (BMI) of 36.0 to 36.9 in adult, unspecified obesity type (HCC)  PLAN: Vitamin D Deficiency Theresa Mathews was informed that low vitamin D levels contributes to fatigue and are associated with obesity, breast, and colon cancer. She  agrees to continue to take prescription. Refill for Vit D @50 ,000 IU every week #4 with no refills and will follow up for routine testing of vitamin D, at least 2-3 times per year. She was informed of the risk of over-replacement of vitamin D and agrees to not increase her dose unless she discusses this with [name] first. She agrees to follow up with our clinic as directed.   Depression with Emotional Eating Behaviors We discussed behavior modification techniques today to help Theresa Mathews deal with her emotional eating and depression. She has agreed to take Wellbutrin SR 150 mg qd #30 with no refill written today. Discussed potential side effects and she will follow up with Dr. Mallie Mussel- psychologist. She agreed to follow up as directed.  Cardiovascular risk counselling Theresa Mathews was given extended (15 minutes) coronary artery disease prevention counseling today. She is 52 y.o. female and has risk factors for heart disease including obesity. We discussed intensive lifestyle modifications today with an emphasis on specific weight loss instructions and strategies. Pt was also informed of the importance of increasing exercise and decreasing saturated fats to help prevent heart disease.  Obesity Theresa Mathews is currently in the action stage of change. As such, her goal is to continue with weight loss efforts She has agreed to follow the Category 2 plan Theresa Mathews has been instructed to work up to a goal of 150 minutes of combined cardio and strengthening exercise per week for weight loss and overall health benefits. We discussed the following Behavioral Modification Stratagies today: emotional eating strategies, ways to avoid night time snacking, keeping healthy foods in the home, no skipping meals, and increasing water intake.    Theresa Mathews has agreed to follow up with our clinic in 2 weeks. She was informed of the importance of frequent follow up visits to maximize her success with intensive lifestyle modifications for her  multiple health conditions.   OBESITY BEHAVIORAL INTERVENTION VISIT  Today's visit was # 9   Starting weight: 220 lb Starting date: 08/26/17 Today's weight :213  lb Today's date: 11/24/2017 Total lbs lost to date: 7 lb We discussed the following behavioral interventions at this visit.   ASK: We discussed the diagnosis of obesity with Theresa Mathews today and Theresa Mathews agreed to give [name] permission to discuss obesity behavioral modification therapy today.  ASSESS: Elinor has the diagnosis of obesity and her BMI today is 36.54 Nicki is in the action stage of change   ADVISE: Theresa Mathews was educated on the multiple health risks of obesity as well as the benefit of weight loss to improve her health. She was advised of the need for long term treatment and the importance of lifestyle modifications to improve her current health and to decrease her risk of future health problems.  AGREE: Multiple dietary modification options and treatment options were discussed and  Theresa Mathews agreed to follow the recommendations documented in the above note.  ARRANGE: Theresa Mathews was educated on the importance of frequent visits to treat obesity as outlined per CMS and USPSTF guidelines and agreed to schedule her next follow up appointment today.  Leary Roca, am acting as transcriptionist for [employer], DO  I have reviewed the above documentation for accuracy and completeness, and I agree with the above. -Jearld Lesch, DO

## 2017-11-25 LAB — COMPREHENSIVE METABOLIC PANEL
A/G RATIO: 1.6 (ref 1.2–2.2)
ALT: 13 IU/L (ref 0–32)
AST: 16 IU/L (ref 0–40)
Albumin: 4.3 g/dL (ref 3.5–5.5)
Alkaline Phosphatase: 105 IU/L (ref 39–117)
BUN/Creatinine Ratio: 16 (ref 9–23)
BUN: 14 mg/dL (ref 6–24)
Bilirubin Total: 0.6 mg/dL (ref 0.0–1.2)
CO2: 26 mmol/L (ref 20–29)
Calcium: 9.5 mg/dL (ref 8.7–10.2)
Chloride: 101 mmol/L (ref 96–106)
Creatinine, Ser: 0.85 mg/dL (ref 0.57–1.00)
GFR calc non Af Amer: 80 mL/min/{1.73_m2} (ref >59)
GFR, EST AFRICAN AMERICAN: 92 mL/min/{1.73_m2} (ref >59)
GLUCOSE: 98 mg/dL (ref 65–99)
Globulin, Total: 2.7 g/dL (ref 1.5–4.5)
POTASSIUM: 4.7 mmol/L (ref 3.5–5.2)
Sodium: 143 mmol/L (ref 134–144)
TOTAL PROTEIN: 7 g/dL (ref 6.0–8.5)

## 2017-11-25 LAB — INSULIN, RANDOM: INSULIN: 11.2 u[IU]/mL (ref 2.6–24.9)

## 2017-11-25 LAB — HEMOGLOBIN A1C
Est. average glucose Bld gHb Est-mCnc: 108 mg/dL
Hgb A1c MFr Bld: 5.4 % (ref 4.8–5.6)

## 2017-11-25 LAB — VITAMIN D 25 HYDROXY (VIT D DEFICIENCY, FRACTURES): Vit D, 25-Hydroxy: 26.7 ng/mL — ABNORMAL LOW (ref 30.0–100.0)

## 2017-12-02 ENCOUNTER — Ambulatory Visit (INDEPENDENT_AMBULATORY_CARE_PROVIDER_SITE_OTHER): Payer: BLUE CROSS/BLUE SHIELD | Admitting: Psychology

## 2017-12-02 DIAGNOSIS — F3289 Other specified depressive episodes: Secondary | ICD-10-CM

## 2017-12-02 NOTE — Progress Notes (Signed)
Office: [phone]  /  Fax: [phone]   Date:December 02, 2017 Time Seen: 8:00am Duration: 30 minutes Provider: Glennie Isle, Psy.D. Type of Session: Individual Therapy   HPI: Luceal initiated services with this provider on September 13, 2017. Per the note for the initial appointment,Daniah reported she uses food for comfort and reward, which she noted she has done her "whole life." Before indulging, she was unable to identify thoughts or emotions as "sometimes it is a reflex." After indulging, she reported, "I feel bad" and described experiencing "should" statements." In addition, Farris shared she attempts portion control and identified stress and being tired as triggers to emotional eating. Furthermore, Seth Bake described experiencing worry as she is the primary caregiver for her husband.Sheshared she has tried to lose weight for "decades." She explained, "food is a thing" for her family and as a child she was told to clean her plate. Liam reported a history of binge eating and mindless eating as a teenager and young adult.  During today's appointment, Nayelis discussed a reduction in emotional eating eating and an increase in coping.  Session Content: Session focused on reviewing progress to date as it relates to the goals of reducing emotional eating and improving coping skills. The session was initiated with the administration of the PHQ-9 and GAD-7, as well as a brief check-in. This provider discussed changes in scores on the PHQ-9 and GAD-7. Angelika attributed the decrease in scores to an increase in effective coping (e.g., thought defusion, pleasurable activities). This provider checked in with any barriers or obstacles that might have come up when engaging in thought defusion. Deaisha discussed difficulty at times focusing on one particular thought. Thus, this provider engaged Raenette in a thought defusion exercise (I.e., leaves on a stream) that did not focus on one particular thought rather  all thoughts as they came up.  Sephira discussed that at times during the exercise she had no thoughts, which she described as being new. This provider and Jamiyla discussed ways to engage in the aforementioned exercise but with different variations (e.g., using bubbles, focusing on clouds). Moreover, this provider shared progressive muscle relaxation as another coping skill. Overall, Joella was receptive to today's session as evidenced by her openness to sharing and responsiveness to feedback. She was open to exploring and verbalizing thoughts, feelings, and behaviors. Additionally, session concluded with Seth Bake stating, "I really appreciate it. It's making a huge difference."   Mental Status Examination: Devan arrived on time for the appointment. She presented as appropriately dressed and groomed. Hanadi appeared her stated age and demonstrated adequate orientation to time, place, person, and purpose of the appointment. She also demonstrated appropriate eye contact. No psychomotor abnormalities or behavioral peculiarities noted. Her mood was euthymic with congruent affect. Her thought processes were logical, linear, and goal-directed. No hallucinations, delusions, bizarre thinking or behavior reported or observed. Judgment, insight, and impulse control appeared to be grossly intact. There was no evidence of paraphasias (i.e., errors in speech, gross mispronunciations, and word substitutions), repetition deficits, or disturbances in volume or prosody (i.e., rhythm and intonation). There was no evidence of attention or memory impairments. Wilhelmena denied current suicidal and homicidal ideation, intent or plan.  Structured Assessment Results: The Patient Health Questionnaire-9 (PHQ-9) is a self-report measure that assesses symptoms and severity of depression over the course of the last two weeks. Smera obtained a score of five suggesting mild depression. Betul finds the endorsed symptoms to be somewhat  difficult. Depression screen [facility] 2/9 12/02/2017  Decreased Interest 1  Down, Depressed, Hopeless 1  PHQ - 2 Score 2  Altered sleeping 1  Tired, decreased energy 1  Change in appetite 0  Feeling bad or failure about yourself  1  Trouble concentrating 0  Moving slowly or fidgety/restless 0  Suicidal thoughts 0  PHQ-9 Score 5  Difficult doing work/chores -   The Generalized Anxiety Disorder-7 (GAD-7) is a brief self-report measure that assesses symptoms of anxiety over the course of the last two weeks. Milley obtained a score of two suggesting minimal anxiety. GAD 7 : Generalized Anxiety Score 12/02/2017  Nervous, Anxious, on Edge 1  Control/stop worrying 0  Worry too much - different things 0  Trouble relaxing 1  Restless 0  Easily annoyed or irritable 0  Afraid - awful might happen 0  Total GAD 7 Score 2  Anxiety Difficulty Not difficult at all   Interventions:  Throughout today's session, empathic reflections and validation were provided. Micheal was administered the PHQ-9 and GAD-7 for symptom monitoring and changes in scores were discussed since the onset of treatment. Skills learned to date were reviewed. Eloni was led through another thought defusion exercise and a handout was provided. Brief psychoeducation regarding progressive muscle relaxation was also provided and Fawnda was given a handout  DSM-5 Diagnosis: 311 (F32.8) Other Specified Depressive Disorder, Emotional Eating  Treatment Goals & Progress: Mackenzy initiated therapeutic services with this provider on September 13, 2017, and the following treatment goals were established: decreasing emotional eating and increasing effective coping skills. During the course of treatment, Mikesha has shown progress in her goals of reducing emotional eating and increasing coping skills as evidenced by an increase in awareness of hunger patterns, awareness of triggers for emotional eating, and engagement in various coping skills (e.g., thought  defusion, pleasurable activities, mindfulness). Regarding emotional eating, Breezie noted, "I think it's better. I feel there have been times where I could get away from it." Previously, Donne indicated she engaged in emotional eating at least once a day; however, now, she shared she engages in emotional eating approximately four times a week. When engaging in emotional eating, Shoua discussed making healthier choices. She also discussed an increase in effective coping. Tyliyah noted, "I find solutions pop into my head more often now rather than feeling stuck."   Plan: Olinda acknowledged a need for continued therapeutic services to continue working towards established goals. Thus, a referral was placed for [facility]. She was encouraged to call this provider's office should she not hear anything about scheduling in the next few weeks. Seth Bake agreed.

## 2017-12-08 ENCOUNTER — Encounter (INDEPENDENT_AMBULATORY_CARE_PROVIDER_SITE_OTHER): Payer: Self-pay | Admitting: Bariatrics

## 2017-12-08 ENCOUNTER — Ambulatory Visit (INDEPENDENT_AMBULATORY_CARE_PROVIDER_SITE_OTHER): Payer: BLUE CROSS/BLUE SHIELD | Admitting: Bariatrics

## 2017-12-08 VITALS — BP 121/79 | HR 59 | Temp 97.3°F | Ht 64.0 in | Wt 213.0 lb

## 2017-12-08 DIAGNOSIS — Z6836 Body mass index (BMI) 36.0-36.9, adult: Secondary | ICD-10-CM

## 2017-12-08 DIAGNOSIS — F3289 Other specified depressive episodes: Secondary | ICD-10-CM | POA: Diagnosis not present

## 2017-12-08 DIAGNOSIS — Z9189 Other specified personal risk factors, not elsewhere classified: Secondary | ICD-10-CM | POA: Diagnosis not present

## 2017-12-08 DIAGNOSIS — E559 Vitamin D deficiency, unspecified: Secondary | ICD-10-CM | POA: Diagnosis not present

## 2017-12-08 MED ORDER — BUPROPION HCL ER (SR) 200 MG PO TB12: 200.0000 mg | ORAL_TABLET | Freq: Every day | ORAL | 0 refills | Status: DC

## 2017-12-08 NOTE — Progress Notes (Signed)
Office: [phone]  /  Fax: [phone]   HPI:   Chief Complaint: OBESITY Theresa Mathews is here to discuss her progress with her obesity treatment plan. She is on the Category 2 plan and is following her eating plan approximately 75 % of the time. She states she is walking 30 minutes 4 times per week. Theresa Mathews had an increase in cravings for sweets in the afternoon and evening and she denies hunger. Her weight is 213 lb (96.6 kg) today and she has maintained weight over a period of 2 weeks since her last visit. She has lost 7 lbs since starting treatment with [name].  Vitamin D deficiency Theresa Mathews has a diagnosis of vitamin D deficiency. Theresa Mathews is currently taking high dose vit D and she denies nausea, vomiting or muscle weakness.  At risk for diabetes Theresa Mathews is at higher than average risk for developing diabetes due to her obesity. She currently denies polyuria or polydipsia.  Depression with emotional eating behaviors (stress eating) Theresa Mathews is taking Wellbutrin 150 mg daily and she has seen our bariatric psychologist Dr. Mallie Mussel. Theresa Mathews struggles with emotional eating and using food for comfort to the extent that it is negatively impacting her health. She often snacks when she is not hungry. Theresa Mathews sometimes feels she is out of control and then feels guilty that she made poor food choices. She has been working on behavior modification techniques to help reduce her emotional eating and has been somewhat successful. She shows no sign of suicidal or homicidal ideations.  Depression screen Theresa Mathews 2/9 12/02/2017 11/18/2017 11/04/2017 10/18/2017 09/13/2017  Decreased Interest 1 1 1 1 1   Down, Depressed, Hopeless 1 1 1 1 1   PHQ - 2 Score 2 2 2 2 2   Altered sleeping 1 1 1 1 2   Tired, decreased energy 1 1 2 1 3   Change in appetite 0 1 1 2 1   Feeling bad or failure about yourself  1 1 1 1 1   Trouble concentrating 0 1 1 0 0  Moving slowly or fidgety/restless 0 0 0 0 0  Suicidal thoughts 0 0 0 0 0  PHQ-9 Score 5 7  8 7 9   Difficult doing work/chores - - - - -     ALLERGIES: No Known Allergies  MEDICATIONS: Current Outpatient Medications on File Prior to Visit  Medication Sig Dispense Refill  . citalopram (CELEXA) 10 MG tablet Take 1 tablet by mouth daily.    . ranitidine (ZANTAC) 75 MG tablet Take 75 mg by mouth daily at 6 (six) AM.     . Vitamin D, Ergocalciferol, (DRISDOL) 50000 units CAPS capsule Take 1 capsule (50,000 Units total) by mouth every 7 (seven) days. 4 capsule 0   Current Facility-Administered Medications on File Prior to Visit  Medication Dose Route Frequency Provider Last Rate Last Dose  . 0.9 %  sodium chloride infusion  500 mL Intravenous Once [name], MD        PAST MEDICAL HISTORY: Past Medical History:  Diagnosis Date  . Anxiety   . Back pain   . Depression   . Fatigue   . GERD (gastroesophageal reflux disease)   . Lactose intolerance     PAST SURGICAL HISTORY: Past Surgical History:  Procedure Laterality Date  . BLADDER SUSPENSION N/A 01/23/2015   Procedure: TRANSVAGINAL TAPE (TVT) PROCEDURE;  Surgeon: Bobbye Charleston, MD;  Location: [facility];  Service: Gynecology;  Laterality: N/A;  . CYSTOSCOPY N/A 01/23/2015   Procedure: CYSTOSCOPY;  Surgeon: Bobbye Charleston, MD;  Location:  [facility];  Service: Gynecology;  Laterality: N/A;  . DILATION AND CURETTAGE OF UTERUS  2005  . ROBOTIC ASSISTED TOTAL HYSTERECTOMY WITH BILATERAL SALPINGO OOPHERECTOMY Bilateral 01/23/2015   Procedure: ROBOTIC ASSISTED TOTAL HYSTERECTOMY WITH BILATERAL SALPINGO OOPHORECTOMY;  Surgeon: Bobbye Charleston, MD;  Location: [facility];  Service: Gynecology;  Laterality: Bilateral;    SOCIAL HISTORY: Social History   Tobacco Use  . Smoking status: Former Smoker    Packs/day: 0.50    Years: 4.00    Pack years: 2.00  . Smokeless tobacco: Never Used  . Tobacco comment: smoked in college  Substance Use Topics  . Alcohol use: Yes    Alcohol/week: 5.0 standard drinks    Types: 5 Glasses of  wine per week  . Drug use: No    FAMILY HISTORY: Family History  Problem Relation Age of Onset  . Hypertension Mother   . Ovarian cancer Mother   . Heart disease Mother 5       a fib  . Obesity Mother   . Hypertension Father   . Diabetes Father   . Hyperlipidemia Father   . Obesity Father     ROS: Review of Systems  Constitutional: Negative for weight loss.  Gastrointestinal: Negative for nausea and vomiting.  Genitourinary: Negative for frequency.  Musculoskeletal:       Negative for muscle weakness  Endo/Heme/Allergies: Negative for polydipsia.  Psychiatric/Behavioral: Positive for depression. Negative for suicidal ideas.       Positive for stress    PHYSICAL EXAM: Blood pressure 121/79, pulse (!) 59, temperature (!) 97.3 F (36.3 C), temperature source Oral, height 5\' 4"  (1.626 m), weight 213 lb (96.6 kg), SpO2 99 %. Body mass index is 36.56 kg/m. Physical Exam  Constitutional: She is oriented to person, place, and time. She appears well-developed and well-nourished.  Cardiovascular: Normal rate.  Pulmonary/Chest: Effort normal.  Musculoskeletal: Normal range of motion.  Neurological: She is oriented to person, place, and time.  Skin: Skin is warm and dry.  Psychiatric: She has a normal mood and affect. Her behavior is normal.  Vitals reviewed.   RECENT LABS AND TESTS: BMET    Component Value Date/Time   NA 143 11/24/2017 0839   K 4.7 11/24/2017 0839   CL 101 11/24/2017 0839   CO2 26 11/24/2017 0839   GLUCOSE 98 11/24/2017 0839   GLUCOSE 97 07/12/2017 1011   BUN 14 11/24/2017 0839   CREATININE 0.85 11/24/2017 0839   CALCIUM 9.5 11/24/2017 0839   GFRNONAA 80 11/24/2017 0839   GFRAA 92 11/24/2017 0839   Lab Results  Component Value Date   HGBA1C 5.4 11/24/2017   HGBA1C 5.3 08/26/2017   Lab Results  Component Value Date   INSULIN 11.2 11/24/2017   INSULIN 9.4 08/26/2017   CBC    Component Value Date/Time   WBC 8.5 07/12/2017 1011   RBC 4.47  07/12/2017 1011   HGB 13.7 07/12/2017 1011   HCT 39.8 07/12/2017 1011   PLT 383.0 07/12/2017 1011   MCV 88.9 07/12/2017 1011   MCH 31.0 01/08/2015 0840   MCHC 34.5 07/12/2017 1011   RDW 13.7 07/12/2017 1011   LYMPHSABS 2.1 07/12/2017 1011   MONOABS 0.5 07/12/2017 1011   EOSABS 0.2 07/12/2017 1011   BASOSABS 0.1 07/12/2017 1011   Iron/TIBC/Ferritin/ %Sat No results found for: IRON, TIBC, FERRITIN, IRONPCTSAT Lipid Panel     Component Value Date/Time   CHOL 183 07/12/2017 1011   TRIG 106.0 07/12/2017 1011   HDL 49.30 07/12/2017  1011   CHOLHDL 4 07/12/2017 1011   VLDL 21.2 07/12/2017 1011   LDLCALC 113 (H) 07/12/2017 1011   Hepatic Function Panel     Component Value Date/Time   PROT 7.0 11/24/2017 0839   ALBUMIN 4.3 11/24/2017 0839   AST 16 11/24/2017 0839   ALT 13 11/24/2017 0839   ALKPHOS 105 11/24/2017 0839   BILITOT 0.6 11/24/2017 0839   BILIDIR 0.0 02/05/2011 0815      Component Value Date/Time   TSH 1.290 08/26/2017 0918   TSH 1.27 07/12/2017 1011   TSH 1.27 02/05/2011 0815   Results for EDIT, RICCIARDELLI (MRN [id number]) as of 12/08/2017 12:04  Ref. Range 11/24/2017 08:39  Vitamin D, 25-Hydroxy Latest Ref Range: 30.0 - 100.0 ng/mL 26.7 (L)   ASSESSMENT AND PLAN: Vitamin D deficiency  Other depression - with emotional eating - Plan: buPROPion (WELLBUTRIN SR) 200 MG 12 hr tablet  At risk for diabetes mellitus  Class 2 severe obesity with serious comorbidity and body mass index (BMI) of 36.0 to 36.9 in adult, unspecified obesity type (Theresa Mathews)  PLAN:  Vitamin D Deficiency Theresa Mathews was informed that low vitamin D levels contributes to fatigue and are associated with obesity, breast, and colon cancer. She agrees to continue to take high dose prescription Vit D @50 ,000 IU every week and increase foods that are high in vitamin D. She will follow up for routine testing of vitamin D, at least 2-3 times per year. She was informed of the risk of over-replacement of vitamin D  and agrees to not increase her dose unless she discusses this with [name] first.  Diabetes risk counseling Theresa Mathews was given extended (15 minutes) diabetes prevention counseling today. She is 52 y.o. female and has risk factors for diabetes including obesity. We discussed intensive lifestyle modifications today with an emphasis on weight loss as well as increasing exercise and decreasing simple carbohydrates in her diet.  Depression with Emotional Eating Behaviors (stress eating) We discussed behavior modification techniques today to help Theresa Mathews deal with her emotional eating and depression. She has agreed to increase Wellbutrin SR to 200 mg daily #30 with no refills and follow up as directed.  Obesity Theresa Mathews is currently in the action stage of change. As such, her goal is to continue with weight loss efforts She has agreed to follow the Category 2 plan Theresa Mathews has been instructed to work up to a goal of 150 minutes of combined cardio and strengthening exercise per week for weight loss and overall health benefits. We discussed the following Behavioral Modification Strategies today: increase H2O intake, increasing lean protein intake and decreasing simple carbohydrates   Theresa Mathews will increase her H2O intake to 64+ ounces daily.  Theresa Mathews has agreed to follow up with our clinic in 2 weeks. She was informed of the importance of frequent follow up visits to maximize her success with intensive lifestyle modifications for her multiple health conditions.   OBESITY BEHAVIORAL INTERVENTION VISIT  Today's visit was # 10   Starting weight: 220 lbs Starting date: 08/26/17 Today's weight : 213 lbs  Today's date: 12/08/2017 Total lbs lost to date: 7   ASK: We discussed the diagnosis of obesity with Theresa Mathews today and Theresa Mathews agreed to give [name] permission to discuss obesity behavioral modification therapy today.  ASSESS: Theresa Mathews has the diagnosis of obesity and her BMI today is 36.54 Theresa Mathews is in the  action stage of change   ADVISE: Theresa Mathews was educated on the multiple health risks of obesity as  well as the benefit of weight loss to improve her health. She was advised of the need for long term treatment and the importance of lifestyle modifications to improve her current health and to decrease her risk of future health problems.  AGREE: Multiple dietary modification options and treatment options were discussed and  Theresa Mathews agreed to follow the recommendations documented in the above note.  ARRANGE: Theresa Mathews was educated on the importance of frequent visits to treat obesity as outlined per CMS and USPSTF guidelines and agreed to schedule her next follow up appointment today.  Theresa Mathews Skains, am acting as Location manager for [employer]. Owens Shark, DO  I have reviewed the above documentation for accuracy and completeness, and I agree with the above. -Jearld Lesch, DO

## 2017-12-16 ENCOUNTER — Encounter (INDEPENDENT_AMBULATORY_CARE_PROVIDER_SITE_OTHER): Payer: Self-pay

## 2017-12-23 ENCOUNTER — Ambulatory Visit (INDEPENDENT_AMBULATORY_CARE_PROVIDER_SITE_OTHER): Payer: Self-pay | Admitting: Bariatrics

## 2017-12-23 ENCOUNTER — Encounter (INDEPENDENT_AMBULATORY_CARE_PROVIDER_SITE_OTHER): Payer: Self-pay

## 2017-12-29 ENCOUNTER — Ambulatory Visit (INDEPENDENT_AMBULATORY_CARE_PROVIDER_SITE_OTHER): Payer: BLUE CROSS/BLUE SHIELD | Admitting: Bariatrics

## 2017-12-29 VITALS — BP 144/83 | HR 63 | Temp 97.8°F | Ht 64.0 in | Wt 212.0 lb

## 2017-12-29 DIAGNOSIS — F3289 Other specified depressive episodes: Secondary | ICD-10-CM | POA: Diagnosis not present

## 2017-12-29 DIAGNOSIS — E559 Vitamin D deficiency, unspecified: Secondary | ICD-10-CM

## 2017-12-29 DIAGNOSIS — Z6836 Body mass index (BMI) 36.0-36.9, adult: Secondary | ICD-10-CM

## 2017-12-29 DIAGNOSIS — Z9189 Other specified personal risk factors, not elsewhere classified: Secondary | ICD-10-CM | POA: Diagnosis not present

## 2017-12-29 MED ORDER — BUPROPION HCL ER (SR) 200 MG PO TB12: 200.0000 mg | ORAL_TABLET | Freq: Every day | ORAL | 0 refills | Status: DC

## 2017-12-30 NOTE — Progress Notes (Signed)
Office: [phone]  /  Fax: [phone]   HPI:   Chief Complaint: OBESITY Theresa Mathews is here to discuss her progress with her obesity treatment plan. She is on the Category 2 plan and is following her eating plan approximately 80 % of the time. She states she is walking for 20-30 minutes 5 times per week. Faun notes decreased stress eating and decreased cravings. She notes fatigue, and decrease in hunger if she stays on schedule with her meals.  Her weight is 212 lb (96.2 kg) today and has had a weight loss of 1 pound over a period of 3 weeks since her last visit. She has lost 8 lbs since starting treatment with [name].  Vitamin D Deficiency Theresa Mathews has a diagnosis of vitamin D deficiency. She is taking high dose prescription Vit D and denies nausea, vomiting or muscle weakness.  At risk for osteopenia and osteoporosis Elton is at higher risk of osteopenia and osteoporosis due to vitamin D deficiency.   Depression with emotional eating behaviors Theresa Mathews is struggling with emotional eating and using food for comfort to the extent that it is negatively impacting her health. She often snacks when she is not hungry. [name] sometimes feels she is out of control and then feels guilty that she made poor food choices. She has been working on behavior modification techniques to help reduce her emotional eating and has been somewhat successful. She shows no sign of suicidal or homicidal ideations.  Depression screen [facility] 2/9 12/02/2017 11/18/2017 11/04/2017 10/18/2017 09/13/2017  Decreased Interest 1 1 1 1 1   Down, Depressed, Hopeless 1 1 1 1 1   PHQ - 2 Score 2 2 2 2 2   Altered sleeping 1 1 1 1 2   Tired, decreased energy 1 1 2 1 3   Change in appetite 0 1 1 2 1   Feeling bad or failure about yourself  1 1 1 1 1   Trouble concentrating 0 1 1 0 0  Moving slowly or fidgety/restless 0 0 0 0 0  Suicidal thoughts 0 0 0 0 0  PHQ-9 Score 5 7 8 7 9   Difficult doing work/chores - - - - -    ALLERGIES: No Known  Allergies  MEDICATIONS: Current Outpatient Medications on File Prior to Visit  Medication Sig Dispense Refill  . citalopram (CELEXA) 10 MG tablet Take 1 tablet by mouth daily.    . ranitidine (ZANTAC) 75 MG tablet Take 75 mg by mouth daily at 6 (six) AM.     . Vitamin D, Ergocalciferol, (DRISDOL) 50000 units CAPS capsule Take 1 capsule (50,000 Units total) by mouth every 7 (seven) days. 4 capsule 0   Current Facility-Administered Medications on File Prior to Visit  Medication Dose Route Frequency Provider Last Rate Last Dose  . 0.9 %  sodium chloride infusion  500 mL Intravenous Once [name], MD        PAST MEDICAL HISTORY: Past Medical History:  Diagnosis Date  . Anxiety   . Back pain   . Depression   . Fatigue   . GERD (gastroesophageal reflux disease)   . Lactose intolerance     PAST SURGICAL HISTORY: Past Surgical History:  Procedure Laterality Date  . BLADDER SUSPENSION N/A 01/23/2015   Procedure: TRANSVAGINAL TAPE (TVT) PROCEDURE;  Surgeon: Bobbye Charleston, MD;  Location: [facility];  Service: Gynecology;  Laterality: N/A;  . CYSTOSCOPY N/A 01/23/2015   Procedure: CYSTOSCOPY;  Surgeon: Bobbye Charleston, MD;  Location: [facility];  Service: Gynecology;  Laterality: N/A;  .  DILATION AND CURETTAGE OF UTERUS  2005  . ROBOTIC ASSISTED TOTAL HYSTERECTOMY WITH BILATERAL SALPINGO OOPHERECTOMY Bilateral 01/23/2015   Procedure: ROBOTIC ASSISTED TOTAL HYSTERECTOMY WITH BILATERAL SALPINGO OOPHORECTOMY;  Surgeon: Bobbye Charleston, MD;  Location: [facility];  Service: Gynecology;  Laterality: Bilateral;    SOCIAL HISTORY: Social History   Tobacco Use  . Smoking status: Former Smoker    Packs/day: 0.50    Years: 4.00    Pack years: 2.00  . Smokeless tobacco: Never Used  . Tobacco comment: smoked in college  Substance Use Topics  . Alcohol use: Yes    Alcohol/week: 5.0 standard drinks    Types: 5 Glasses of wine per week  . Drug use: No    FAMILY HISTORY: Family History    Problem Relation Age of Onset  . Hypertension Mother   . Ovarian cancer Mother   . Heart disease Mother 66       a fib  . Obesity Mother   . Hypertension Father   . Diabetes Father   . Hyperlipidemia Father   . Obesity Father     ROS: Review of Systems  Constitutional: Positive for malaise/fatigue and weight loss.  Gastrointestinal: Negative for nausea and vomiting.  Musculoskeletal:       Negative muscle weakness  Psychiatric/Behavioral: Positive for depression. Negative for suicidal ideas.    PHYSICAL EXAM: Blood pressure (!) 144/83, pulse 63, temperature 97.8 F (36.6 C), temperature source Oral, height 5\' 4"  (1.626 m), weight 212 lb (96.2 kg), SpO2 100 %. Body mass index is 36.39 kg/m. Physical Exam  Constitutional: She is oriented to person, place, and time. She appears well-developed and well-nourished.  Cardiovascular: Normal rate.  Pulmonary/Chest: Effort normal.  Musculoskeletal: Normal range of motion.  Neurological: She is oriented to person, place, and time.  Skin: Skin is warm and dry.  Psychiatric: She has a normal mood and affect. Her behavior is normal.  Vitals reviewed.   RECENT LABS AND TESTS: BMET    Component Value Date/Time   NA 143 11/24/2017 0839   K 4.7 11/24/2017 0839   CL 101 11/24/2017 0839   CO2 26 11/24/2017 0839   GLUCOSE 98 11/24/2017 0839   GLUCOSE 97 07/12/2017 1011   BUN 14 11/24/2017 0839   CREATININE 0.85 11/24/2017 0839   CALCIUM 9.5 11/24/2017 0839   GFRNONAA 80 11/24/2017 0839   GFRAA 92 11/24/2017 0839   Lab Results  Component Value Date   HGBA1C 5.4 11/24/2017   HGBA1C 5.3 08/26/2017   Lab Results  Component Value Date   INSULIN 11.2 11/24/2017   INSULIN 9.4 08/26/2017   CBC    Component Value Date/Time   WBC 8.5 07/12/2017 1011   RBC 4.47 07/12/2017 1011   HGB 13.7 07/12/2017 1011   HCT 39.8 07/12/2017 1011   PLT 383.0 07/12/2017 1011   MCV 88.9 07/12/2017 1011   MCH 31.0 01/08/2015 0840   MCHC 34.5  07/12/2017 1011   RDW 13.7 07/12/2017 1011   LYMPHSABS 2.1 07/12/2017 1011   MONOABS 0.5 07/12/2017 1011   EOSABS 0.2 07/12/2017 1011   BASOSABS 0.1 07/12/2017 1011   Iron/TIBC/Ferritin/ %Sat No results found for: IRON, TIBC, FERRITIN, IRONPCTSAT Lipid Panel     Component Value Date/Time   CHOL 183 07/12/2017 1011   TRIG 106.0 07/12/2017 1011   HDL 49.30 07/12/2017 1011   CHOLHDL 4 07/12/2017 1011   VLDL 21.2 07/12/2017 1011   LDLCALC 113 (H) 07/12/2017 1011   Hepatic Function Panel  Component Value Date/Time   PROT 7.0 11/24/2017 0839   ALBUMIN 4.3 11/24/2017 0839   AST 16 11/24/2017 0839   ALT 13 11/24/2017 0839   ALKPHOS 105 11/24/2017 0839   BILITOT 0.6 11/24/2017 0839   BILIDIR 0.0 02/05/2011 0815      Component Value Date/Time   TSH 1.290 08/26/2017 0918   TSH 1.27 07/12/2017 1011   TSH 1.27 02/05/2011 0815  Results for KIMARA, BENCOMO (MRN [id number]) as of 12/30/2017 15:50  Ref. Range 11/24/2017 08:39  Vitamin D, 25-Hydroxy Latest Ref Range: 30.0 - 100.0 ng/mL 26.7 (L)    ASSESSMENT AND PLAN: Vitamin D deficiency  Other depression - with emotional eating - Plan: buPROPion (WELLBUTRIN SR) 200 MG 12 hr tablet  At risk for osteoporosis  Class 2 severe obesity with serious comorbidity and body mass index (BMI) of 36.0 to 36.9 in adult, unspecified obesity type (Ali Molina)  PLAN:  Vitamin D Deficiency Eshani was informed that low vitamin D levels contributes to fatigue and are associated with obesity, breast, and colon cancer. Jesseka agrees to continue taking prescription Vit D @50 ,000 IU every week and will follow up for routine testing of vitamin D, at least 2-3 times per year. She was informed of the risk of over-replacement of vitamin D and agrees to not increase her dose unless she discusses this with [name] first. Madhavi agrees to follow up with our clinic in 2 weeks.  At risk for osteopenia and osteoporosis Jenesys was given extended  (15 minutes) osteoporosis  prevention counseling today. Debar is at risk for osteopenia and osteoporsis due to her vitamin D deficiency. She was encouraged to take her vitamin D and follow her higher calcium diet and increase strengthening exercise to help strengthen her bones and decrease her risk of osteopenia and osteoporosis.  Depression with Emotional Eating Behaviors We discussed behavior modification techniques today to help Nakaila deal with her emotional eating and depression. Anelia agrees to continue taking bupropion 200 mg 1 tablet PO daily #30 and we will refill for 1 month. Deanna agrees to follow up with our clinic in 2 weeks.  Obesity Jocelyne is currently in the action stage of change. As such, her goal is to continue with weight loss efforts She has agreed to follow the Category 2 plan Fernande has been instructed to work up to a goal of 150 minutes of combined cardio and strengthening exercise per week or walk at work for weight loss and overall health benefits. We discussed the following Behavioral Modification Strategies today: increasing lean protein intake, decreasing simple carbohydrates, increasing vegetables, decrease eating out, work on meal planning and easy cooking plans, increase H20 intake, and no skipping meals Yaritsa is to increase meal planning and recipes were given.  Khya has agreed to follow up with our clinic in 2 weeks. She was informed of the importance of frequent follow up visits to maximize her success with intensive lifestyle modifications for her multiple health conditions.   OBESITY BEHAVIORAL INTERVENTION VISIT  Today's visit was # 8   Starting weight: 220 lbs Starting date: 08/26/17 Today's weight : 212 lbs  Today's date: 12/29/2017 Total lbs lost to date: 8    ASK: We discussed the diagnosis of obesity with Jannette Fogo today and Alda agreed to give [name] permission to discuss obesity behavioral modification therapy today.  ASSESS: Keelie has the diagnosis of  obesity and her BMI today is 36.37 Arcelia is in the action stage of change   ADVISE: Dajae was  educated on the multiple health risks of obesity as well as the benefit of weight loss to improve her health. She was advised of the need for long term treatment and the importance of lifestyle modifications to improve her current health and to decrease her risk of future health problems.  AGREE: Multiple dietary modification options and treatment options were discussed and  Ailanie agreed to follow the recommendations documented in the above note.  ARRANGE: Sherrice was educated on the importance of frequent visits to treat obesity as outlined per CMS and USPSTF guidelines and agreed to schedule her next follow up appointment today.  Wilhemena Durie, am acting as transcriptionist for [employer], DO  I have reviewed the above documentation for accuracy and completeness, and I agree with the above. -Jearld Lesch, DO

## 2017-12-31 DIAGNOSIS — Z23 Encounter for immunization: Secondary | ICD-10-CM | POA: Diagnosis not present

## 2017-12-31 DIAGNOSIS — D2261 Melanocytic nevi of right upper limb, including shoulder: Secondary | ICD-10-CM | POA: Diagnosis not present

## 2017-12-31 DIAGNOSIS — D2371 Other benign neoplasm of skin of right lower limb, including hip: Secondary | ICD-10-CM | POA: Diagnosis not present

## 2017-12-31 DIAGNOSIS — D2372 Other benign neoplasm of skin of left lower limb, including hip: Secondary | ICD-10-CM | POA: Diagnosis not present

## 2017-12-31 DIAGNOSIS — D225 Melanocytic nevi of trunk: Secondary | ICD-10-CM | POA: Diagnosis not present

## 2018-01-13 ENCOUNTER — Ambulatory Visit (INDEPENDENT_AMBULATORY_CARE_PROVIDER_SITE_OTHER): Payer: BLUE CROSS/BLUE SHIELD | Admitting: Bariatrics

## 2018-01-13 VITALS — BP 147/83 | HR 62 | Temp 98.0°F | Ht 64.0 in | Wt 214.0 lb

## 2018-01-13 DIAGNOSIS — E559 Vitamin D deficiency, unspecified: Secondary | ICD-10-CM

## 2018-01-13 DIAGNOSIS — F3289 Other specified depressive episodes: Secondary | ICD-10-CM

## 2018-01-13 DIAGNOSIS — Z6836 Body mass index (BMI) 36.0-36.9, adult: Secondary | ICD-10-CM

## 2018-01-18 DIAGNOSIS — E66812 Obesity, class 2: Secondary | ICD-10-CM | POA: Insufficient documentation

## 2018-01-18 DIAGNOSIS — Z6836 Body mass index (BMI) 36.0-36.9, adult: Secondary | ICD-10-CM

## 2018-01-18 NOTE — Progress Notes (Signed)
Office: [phone]  /  Fax: [phone]   HPI:   Chief Complaint: OBESITY Theresa Mathews is here to discuss her progress with her obesity treatment plan. She is on the Category 2 plan and is following her eating plan approximately 60 % of the time. She states she is walking 20 to 30 minutes 5 times per week. Theresa Mathews has had "more trouble following the diet" and she is "eating out more". Theresa Mathews is struggling more in the evening. Theresa Mathews has started traveling more on Monday. Her weight is 214 lb (97.1 kg) today and has had a weight gain of 2 pounds over a period of 2 weeks since her last visit. She has lost 6 lbs since starting treatment with [name].  Vitamin D deficiency Theresa Mathews has a diagnosis of vitamin D deficiency. She is currently taking high dose prescription vit D and denies nausea, vomiting or muscle weakness.  Depression with emotional eating behaviors Theresa Mathews is struggling with emotional eating and using food for comfort to the extent that it is negatively impacting her health. She often snacks when she is not hungry. Theresa Mathews sometimes feels she is out of control and then feels guilty that she made poor food choices. She has been working on behavior modification techniques to help reduce her emotional eating and has been somewhat successful. She is currently taking high dose Bupropion. She shows no sign of suicidal or homicidal ideations.  Depression screen [facility] 2/9 12/02/2017 11/18/2017 11/04/2017 10/18/2017 09/13/2017  Decreased Interest 1 1 1 1 1   Down, Depressed, Hopeless 1 1 1 1 1   PHQ - 2 Score 2 2 2 2 2   Altered sleeping 1 1 1 1 2   Tired, decreased energy 1 1 2 1 3   Change in appetite 0 1 1 2 1   Feeling bad or failure about yourself  1 1 1 1 1   Trouble concentrating 0 1 1 0 0  Moving slowly or fidgety/restless 0 0 0 0 0  Suicidal thoughts 0 0 0 0 0  PHQ-9 Score 5 7 8 7 9   Difficult doing work/chores - - - - -      ALLERGIES: No Known Allergies  MEDICATIONS: Current Outpatient  Medications on File Prior to Visit  Medication Sig Dispense Refill   buPROPion (WELLBUTRIN SR) 200 MG 12 hr tablet Take 1 tablet (200 mg total) by mouth daily at 12 noon. 30 tablet 0   citalopram (CELEXA) 10 MG tablet Take 1 tablet by mouth daily.     ranitidine (ZANTAC) 75 MG tablet Take 75 mg by mouth daily at 6 (six) AM.      Vitamin D, Ergocalciferol, (DRISDOL) 50000 units CAPS capsule Take 1 capsule (50,000 Units total) by mouth every 7 (seven) days. 4 capsule 0   Current Facility-Administered Medications on File Prior to Visit  Medication Dose Route Frequency Provider Last Rate Last Dose   0.9 %  sodium chloride infusion  500 mL Intravenous Once [name], MD        PAST MEDICAL HISTORY: Past Medical History:  Diagnosis Date   Anxiety    Back pain    Depression    Fatigue    GERD (gastroesophageal reflux disease)    Lactose intolerance     PAST SURGICAL HISTORY: Past Surgical History:  Procedure Laterality Date   BLADDER SUSPENSION N/A 01/23/2015   Procedure: TRANSVAGINAL TAPE (TVT) PROCEDURE;  Surgeon: Bobbye Charleston, MD;  Location: [facility];  Service: Gynecology;  Laterality: N/A;   CYSTOSCOPY N/A 01/23/2015   Procedure:  CYSTOSCOPY;  Surgeon: Bobbye Charleston, MD;  Location: [facility];  Service: Gynecology;  Laterality: N/A;   DILATION AND CURETTAGE OF UTERUS  2005   ROBOTIC ASSISTED TOTAL HYSTERECTOMY WITH BILATERAL SALPINGO OOPHERECTOMY Bilateral 01/23/2015   Procedure: ROBOTIC ASSISTED TOTAL HYSTERECTOMY WITH BILATERAL SALPINGO OOPHORECTOMY;  Surgeon: Bobbye Charleston, MD;  Location: [facility];  Service: Gynecology;  Laterality: Bilateral;    SOCIAL HISTORY: Social History   Tobacco Use   Smoking status: Former Smoker    Packs/day: 0.50    Years: 4.00    Pack years: 2.00   Smokeless tobacco: Never Used   Tobacco comment: smoked in college  Substance Use Topics   Alcohol use: Yes    Alcohol/week: 5.0 standard drinks    Types: 5 Glasses of  wine per week   Drug use: No    FAMILY HISTORY: Family History  Problem Relation Age of Onset   Hypertension Mother    Ovarian cancer Mother    Heart disease Mother 80       a fib   Obesity Mother    Hypertension Father    Diabetes Father    Hyperlipidemia Father    Obesity Father     ROS: Review of Systems  Constitutional: Negative for weight loss.  Gastrointestinal: Negative for nausea and vomiting.  Musculoskeletal:       Negative for muscle weakness   Psychiatric/Behavioral: Positive for depression. Negative for suicidal ideas.    PHYSICAL EXAM: Blood pressure (!) 147/83, pulse 62, temperature 98 F (36.7 C), temperature source Oral, height 5\' 4"  (1.626 m), weight 214 lb (97.1 kg). Body mass index is 36.73 kg/m. Physical Exam  Constitutional: She is oriented to person, place, and time. She appears well-developed and well-nourished.  Cardiovascular: Normal rate.  Pulmonary/Chest: Effort normal.  Musculoskeletal: Normal range of motion.  Neurological: She is oriented to person, place, and time.  Skin: Skin is warm and dry.  Psychiatric: She has a normal mood and affect. Her behavior is normal.  Vitals reviewed.   RECENT LABS AND TESTS: BMET    Component Value Date/Time   NA 143 11/24/2017 0839   K 4.7 11/24/2017 0839   CL 101 11/24/2017 0839   CO2 26 11/24/2017 0839   GLUCOSE 98 11/24/2017 0839   GLUCOSE 97 07/12/2017 1011   BUN 14 11/24/2017 0839   CREATININE 0.85 11/24/2017 0839   CALCIUM 9.5 11/24/2017 0839   GFRNONAA 80 11/24/2017 0839   GFRAA 92 11/24/2017 0839   Lab Results  Component Value Date   HGBA1C 5.4 11/24/2017   HGBA1C 5.3 08/26/2017   Lab Results  Component Value Date   INSULIN 11.2 11/24/2017   INSULIN 9.4 08/26/2017   CBC    Component Value Date/Time   WBC 8.5 07/12/2017 1011   RBC 4.47 07/12/2017 1011   HGB 13.7 07/12/2017 1011   HCT 39.8 07/12/2017 1011   PLT 383.0 07/12/2017 1011   MCV 88.9 07/12/2017 1011     MCH 31.0 01/08/2015 0840   MCHC 34.5 07/12/2017 1011   RDW 13.7 07/12/2017 1011   LYMPHSABS 2.1 07/12/2017 1011   MONOABS 0.5 07/12/2017 1011   EOSABS 0.2 07/12/2017 1011   BASOSABS 0.1 07/12/2017 1011   Iron/TIBC/Ferritin/ %Sat No results found for: IRON, TIBC, FERRITIN, IRONPCTSAT Lipid Panel     Component Value Date/Time   CHOL 183 07/12/2017 1011   TRIG 106.0 07/12/2017 1011   HDL 49.30 07/12/2017 1011   CHOLHDL 4 07/12/2017 1011   VLDL 21.2 07/12/2017 1011  LDLCALC 113 (H) 07/12/2017 1011   Hepatic Function Panel     Component Value Date/Time   PROT 7.0 11/24/2017 0839   ALBUMIN 4.3 11/24/2017 0839   AST 16 11/24/2017 0839   ALT 13 11/24/2017 0839   ALKPHOS 105 11/24/2017 0839   BILITOT 0.6 11/24/2017 0839   BILIDIR 0.0 02/05/2011 0815      Component Value Date/Time   TSH 1.290 08/26/2017 0918   TSH 1.27 07/12/2017 1011   TSH 1.27 02/05/2011 0815   Results for JAMYAH, FOLK (MRN [id number]) as of 01/18/2018 13:24  Ref. Range 11/24/2017 08:39  Vitamin D, 25-Hydroxy Latest Ref Range: 30.0 - 100.0 ng/mL 26.7 (L)   ASSESSMENT AND PLAN: Vitamin D deficiency  Other depression - with emotional eating  Class 2 severe obesity with serious comorbidity and body mass index (BMI) of 36.0 to 36.9 in adult, unspecified obesity type (Theresa Mathews)  PLAN:  Vitamin D Deficiency Theresa Mathews was informed that low vitamin D levels contributes to fatigue and are associated with obesity, breast, and colon cancer. She agrees to continue to take high dose prescription Vit D @50 ,000 IU every week and will follow up for routine testing of vitamin D, at least 2-3 times per year. She was informed of the risk of over-replacement of vitamin D and agrees to not increase her dose unless she discusses this with [name] first.  Depression with Emotional Eating Behaviors We discussed behavior modification techniques today to help Theresa Mathews deal with her emotional eating and depression. She has agreed to take  high dose Wellbutrin SR 200 mg qd and agreed to follow up as directed.  I spent > than 50% of the 15 minute visit on counseling as documented in the note.  Obesity Theresa Mathews is currently in the action stage of change. As such, her goal is to continue with weight loss efforts She has agreed to keep a food journal with 400 to 450 calories and 25 to 32 grams of protein at supper daily and follow the Category 2 plan Theresa Mathews has been instructed to increase walking at work for weight loss and overall health benefits. We discussed the following Behavioral Modification Strategies today: increase H2O intake, no skipping meals, keeping healthy foods in the home, better snacking choices, increasing lean protein intake, decreasing simple carbohydrates , increasing vegetables, decrease eating out, work on meal planning and easy cooking plans and emotional eating strategies  "On the road" and "emotional eating" handouts were given to patient today.  Theresa Mathews has agreed to follow up with our clinic in 2 weeks. She was informed of the importance of frequent follow up visits to maximize her success with intensive lifestyle modifications for her multiple health conditions.   OBESITY BEHAVIORAL INTERVENTION VISIT  Today's visit was # 9   Starting weight: 220 lbs Starting date: 08/26/17 Today's weight : 214 lbs Today's date: 01/13/2018 Total lbs lost to date: 6 At least 15 minutes were spent on discussing the following behavioral intervention visit.   ASK: We discussed the diagnosis of obesity with Theresa Mathews today and Theresa Mathews agreed to give [name] permission to discuss obesity behavioral modification therapy today.  ASSESS: Theresa Mathews has the diagnosis of obesity and her BMI today is 36.72 Theresa Mathews is in the action stage of change   ADVISE: Theresa Mathews was educated on the multiple health risks of obesity as well as the benefit of weight loss to improve her health. She was advised of the need for long term treatment  and the importance of lifestyle  modifications to improve her current health and to decrease her risk of future health problems.  AGREE: Multiple dietary modification options and treatment options were discussed and  Theresa Mathews agreed to follow the recommendations documented in the above note.  ARRANGE: Theresa Mathews was educated on the importance of frequent visits to treat obesity as outlined per CMS and USPSTF guidelines and agreed to schedule her next follow up appointment today.  Corey Skains, am acting as Location manager for [employer]. Owens Shark, DO  I have reviewed the above documentation for accuracy and completeness, and I agree with the above. -Jearld Lesch, DO

## 2018-01-27 ENCOUNTER — Ambulatory Visit (INDEPENDENT_AMBULATORY_CARE_PROVIDER_SITE_OTHER): Payer: Self-pay | Admitting: Bariatrics

## 2018-01-27 ENCOUNTER — Encounter (INDEPENDENT_AMBULATORY_CARE_PROVIDER_SITE_OTHER): Payer: Self-pay

## 2018-02-07 ENCOUNTER — Encounter (INDEPENDENT_AMBULATORY_CARE_PROVIDER_SITE_OTHER): Payer: Self-pay | Admitting: Family Medicine

## 2018-02-07 ENCOUNTER — Ambulatory Visit (INDEPENDENT_AMBULATORY_CARE_PROVIDER_SITE_OTHER): Payer: BLUE CROSS/BLUE SHIELD | Admitting: Family Medicine

## 2018-02-07 VITALS — BP 155/88 | HR 65 | Temp 97.9°F | Ht 64.0 in | Wt 216.0 lb

## 2018-02-07 DIAGNOSIS — F3289 Other specified depressive episodes: Secondary | ICD-10-CM | POA: Diagnosis not present

## 2018-02-07 DIAGNOSIS — E559 Vitamin D deficiency, unspecified: Secondary | ICD-10-CM

## 2018-02-07 DIAGNOSIS — Z9189 Other specified personal risk factors, not elsewhere classified: Secondary | ICD-10-CM | POA: Diagnosis not present

## 2018-02-07 DIAGNOSIS — Z6837 Body mass index (BMI) 37.0-37.9, adult: Secondary | ICD-10-CM

## 2018-02-07 MED ORDER — BUPROPION HCL ER (SR) 200 MG PO TB12: 200.0000 mg | ORAL_TABLET | Freq: Two times a day (BID) | ORAL | 0 refills | Status: DC

## 2018-02-08 NOTE — Progress Notes (Signed)
Office: [phone]  /  Fax: [phone]   HPI:   Chief Complaint: OBESITY Theresa Mathews is here to discuss her progress with her obesity treatment plan. She is on the keep a food journal with 400-450 calories and 25-30 grams of protein at supper daily and follow the Category 2 plan and is following her eating plan approximately 70% of the time. She states she is walking for 20 minutes 4 times per week. Theresa Mathews struggles with motivation. She states people bring food into work and this gets her off track.  Her weight is 216 lb (98 kg) today and has gained 2 pounds since her last visit. She has lost 4 lbs since starting treatment with [name].  Vitamin D Deficiency Theresa Mathews has a diagnosis of vitamin D deficiency. She is stable on prescription Vit D, but level is not at goal. She denies nausea, vomiting or muscle weakness.  Depression with emotional eating behaviors Theresa Mathews states bupropion is helping with emotional eating. She decreased snacking in the afternoon, but still struggles with cravings. She has completed her course of therapy with Dr. Mallie Mussel. Theresa Mathews struggles with emotional eating and using food for comfort to the extent that it is negatively impacting her health. She often snacks when she is not hungry. Theresa Mathews sometimes feels she is out of control and then feels guilty that she made poor food choices. She has been working on behavior modification techniques to help reduce her emotional eating and has been somewhat successful. She shows no sign of suicidal or homicidal ideations.  Depression screen [facility] 2/9 12/02/2017 11/18/2017 11/04/2017 10/18/2017 09/13/2017  Decreased Interest 1 1 1 1 1   Down, Depressed, Hopeless 1 1 1 1 1   PHQ - 2 Score 2 2 2 2 2   Altered sleeping 1 1 1 1 2   Tired, decreased energy 1 1 2 1 3   Change in appetite 0 1 1 2 1   Feeling bad or failure about yourself  1 1 1 1 1   Trouble concentrating 0 1 1 0 0  Moving slowly or fidgety/restless 0 0 0 0 0  Suicidal thoughts 0 0 0 0 0    PHQ-9 Score 5 7 8 7 9   Difficult doing work/chores - - - - -    At risk for diabetes Theresa Mathews is at higher than average risk for developing diabetes due to her obesity. She currently denies polyuria or polydipsia.  ALLERGIES: No Known Allergies  MEDICATIONS: Current Outpatient Medications on File Prior to Visit  Medication Sig Dispense Refill  . citalopram (CELEXA) 10 MG tablet Take 1 tablet by mouth daily.    . ranitidine (ZANTAC) 75 MG tablet Take 75 mg by mouth daily at 6 (six) AM.     . Vitamin D, Ergocalciferol, (DRISDOL) 50000 units CAPS capsule Take 1 capsule (50,000 Units total) by mouth every 7 (seven) days. 4 capsule 0   Current Facility-Administered Medications on File Prior to Visit  Medication Dose Route Frequency Provider Last Rate Last Dose  . 0.9 %  sodium chloride infusion  500 mL Intravenous Once [name], MD        PAST MEDICAL HISTORY: Past Medical History:  Diagnosis Date  . Anxiety   . Back pain   . Depression   . Fatigue   . GERD (gastroesophageal reflux disease)   . Lactose intolerance     PAST SURGICAL HISTORY: Past Surgical History:  Procedure Laterality Date  . BLADDER SUSPENSION N/A 01/23/2015   Procedure: TRANSVAGINAL TAPE (TVT) PROCEDURE;  Surgeon: Sharyn Lull  Philis Pique, MD;  Location: [facility];  Service: Gynecology;  Laterality: N/A;  . CYSTOSCOPY N/A 01/23/2015   Procedure: CYSTOSCOPY;  Surgeon: Bobbye Charleston, MD;  Location: [facility];  Service: Gynecology;  Laterality: N/A;  . DILATION AND CURETTAGE OF UTERUS  2005  . ROBOTIC ASSISTED TOTAL HYSTERECTOMY WITH BILATERAL SALPINGO OOPHERECTOMY Bilateral 01/23/2015   Procedure: ROBOTIC ASSISTED TOTAL HYSTERECTOMY WITH BILATERAL SALPINGO OOPHORECTOMY;  Surgeon: Bobbye Charleston, MD;  Location: [facility];  Service: Gynecology;  Laterality: Bilateral;    SOCIAL HISTORY: Social History   Tobacco Use  . Smoking status: Former Smoker    Packs/day: 0.50    Years: 4.00    Pack years: 2.00  .  Smokeless tobacco: Never Used  . Tobacco comment: smoked in college  Substance Use Topics  . Alcohol use: Yes    Alcohol/week: 5.0 standard drinks    Types: 5 Glasses of wine per week  . Drug use: No    FAMILY HISTORY: Family History  Problem Relation Age of Onset  . Hypertension Mother   . Ovarian cancer Mother   . Heart disease Mother 34       a fib  . Obesity Mother   . Hypertension Father   . Diabetes Father   . Hyperlipidemia Father   . Obesity Father     ROS: Review of Systems  Constitutional: Negative for weight loss.  Gastrointestinal: Negative for nausea and vomiting.  Genitourinary: Negative for frequency.  Musculoskeletal:       Negative muscle weakness  Endo/Heme/Allergies: Negative for polydipsia.  Psychiatric/Behavioral: Positive for depression. Negative for suicidal ideas.    PHYSICAL EXAM: Blood pressure (!) 155/88, pulse 65, temperature 97.9 F (36.6 C), temperature source Oral, height 5\' 4"  (1.626 m), weight 216 lb (98 kg), SpO2 99 %. Body mass index is 37.08 kg/m. Physical Exam  Constitutional: She is oriented to person, place, and time. She appears well-developed and well-nourished.  Cardiovascular: Normal rate.  Pulmonary/Chest: Effort normal.  Musculoskeletal: Normal range of motion.  Neurological: She is oriented to person, place, and time.  Skin: Skin is warm and dry.  Psychiatric: She has a normal mood and affect. Her behavior is normal.  Vitals reviewed.   RECENT LABS AND TESTS: BMET    Component Value Date/Time   NA 143 11/24/2017 0839   K 4.7 11/24/2017 0839   CL 101 11/24/2017 0839   CO2 26 11/24/2017 0839   GLUCOSE 98 11/24/2017 0839   GLUCOSE 97 07/12/2017 1011   BUN 14 11/24/2017 0839   CREATININE 0.85 11/24/2017 0839   CALCIUM 9.5 11/24/2017 0839   GFRNONAA 80 11/24/2017 0839   GFRAA 92 11/24/2017 0839   Lab Results  Component Value Date   HGBA1C 5.4 11/24/2017   HGBA1C 5.3 08/26/2017   Lab Results  Component  Value Date   INSULIN 11.2 11/24/2017   INSULIN 9.4 08/26/2017   CBC    Component Value Date/Time   WBC 8.5 07/12/2017 1011   RBC 4.47 07/12/2017 1011   HGB 13.7 07/12/2017 1011   HCT 39.8 07/12/2017 1011   PLT 383.0 07/12/2017 1011   MCV 88.9 07/12/2017 1011   MCH 31.0 01/08/2015 0840   MCHC 34.5 07/12/2017 1011   RDW 13.7 07/12/2017 1011   LYMPHSABS 2.1 07/12/2017 1011   MONOABS 0.5 07/12/2017 1011   EOSABS 0.2 07/12/2017 1011   BASOSABS 0.1 07/12/2017 1011   Iron/TIBC/Ferritin/ %Sat No results found for: IRON, TIBC, FERRITIN, IRONPCTSAT Lipid Panel     Component Value Date/Time  CHOL 183 07/12/2017 1011   TRIG 106.0 07/12/2017 1011   HDL 49.30 07/12/2017 1011   CHOLHDL 4 07/12/2017 1011   VLDL 21.2 07/12/2017 1011   LDLCALC 113 (H) 07/12/2017 1011   Hepatic Function Panel     Component Value Date/Time   PROT 7.0 11/24/2017 0839   ALBUMIN 4.3 11/24/2017 0839   AST 16 11/24/2017 0839   ALT 13 11/24/2017 0839   ALKPHOS 105 11/24/2017 0839   BILITOT 0.6 11/24/2017 0839   BILIDIR 0.0 02/05/2011 0815      Component Value Date/Time   TSH 1.290 08/26/2017 0918   TSH 1.27 07/12/2017 1011   TSH 1.27 02/05/2011 0815  Results for AKAYLAH, LALLEY (MRN [id number]) as of 02/08/2018 09:14  Ref. Range 11/24/2017 08:39  Vitamin D, 25-Hydroxy Latest Ref Range: 30.0 - 100.0 ng/mL 26.7 (L)    ASSESSMENT AND PLAN: Vitamin D deficiency  Other depression - with emotional eating - Plan: buPROPion (WELLBUTRIN SR) 200 MG 12 hr tablet  At risk for diabetes mellitus  Class 2 severe obesity with serious comorbidity and body mass index (BMI) of 37.0 to 37.9 in adult, unspecified obesity type ([place])  PLAN:  Vitamin D Deficiency Theresa Mathews was informed that low vitamin D levels contributes to fatigue and are associated with obesity, breast, and colon cancer. Theresa Mathews agrees to continue taking prescription Vit D @50 ,000 IU every week and will follow up for routine testing of vitamin D, at  least 2-3 times per year. She was informed of the risk of over-replacement of vitamin D and agrees to not increase her dose unless she discusses this with [name] first. We will recheck labs in 1 month. Theresa Mathews agrees to follow up with our clinic in 2 weeks.  Depression with Emotional Eating Behaviors We discussed behavior modification techniques today to help Theresa Mathews deal with her emotional eating and depression.  Theresa Mathews agrees to increase bupropion SR to 200 mg BID with no refills. Theresa Mathews agrees to follow up with our clinic in 2 weeks.  Diabetes risk counselling Theresa Mathews was given extended (15 minutes) diabetes prevention counseling today. She is 52 y.o. female and has risk factors for diabetes including obesity. We discussed intensive lifestyle modifications today with an emphasis on weight loss as well as increasing exercise and decreasing simple carbohydrates in her diet.  Obesity Theresa Mathews is currently in the action stage of change. As such, her goal is to continue with weight loss efforts She has agreed to follow the Category 2 plan Theresa Mathews has been instructed to work up to a goal of 150 minutes of combined cardio and strengthening exercise per week or as above for weight loss and overall health benefits. We discussed the following Behavioral Modification Strategies today: work on meal planning and easy cooking plans, better snacking choices, planning for success, and keep a strict food journal Theresa Mathews is to keep sweet snacks at work, such as Fiber One bars to eat instead of sweets brought into work.  Theresa Mathews has agreed to follow up with our clinic in 2 weeks. She was informed of the importance of frequent follow up visits to maximize her success with intensive lifestyle modifications for her multiple health conditions.   OBESITY BEHAVIORAL INTERVENTION VISIT  Today's visit was # 16  Starting weight: 220 lbs Starting date: 08/26/17 Today's weight : 216 lbs  Today's date: 02/07/2018 Total lbs lost to  date: 4    ASK: We discussed the diagnosis of obesity with Jannette Fogo today and Theresa Mathews agreed to give  [name] permission to discuss obesity behavioral modification therapy today.  ASSESS: Theresa Mathews has the diagnosis of obesity and her BMI today is 37.06 Theresa Mathews is in the action stage of change   ADVISE: Theresa Mathews was educated on the multiple health risks of obesity as well as the benefit of weight loss to improve her health. She was advised of the need for long term treatment and the importance of lifestyle modifications to improve her current health and to decrease her risk of future health problems.  AGREE: Multiple dietary modification options and treatment options were discussed and  Theresa Mathews agreed to follow the recommendations documented in the above note.  ARRANGE: Alayna was educated on the importance of frequent visits to treat obesity as outlined per CMS and USPSTF guidelines and agreed to schedule her next follow up appointment today.  Theresa Mathews, am acting as Location manager for [employer], FNP-C.  I have reviewed the above documentation for accuracy and completeness, and I agree with the above.  - Dawn Whitmire, FNP-C.

## 2018-02-09 ENCOUNTER — Encounter (INDEPENDENT_AMBULATORY_CARE_PROVIDER_SITE_OTHER): Payer: Self-pay | Admitting: Family Medicine

## 2018-02-21 ENCOUNTER — Encounter (INDEPENDENT_AMBULATORY_CARE_PROVIDER_SITE_OTHER): Payer: Self-pay | Admitting: Family Medicine

## 2018-02-21 ENCOUNTER — Ambulatory Visit (INDEPENDENT_AMBULATORY_CARE_PROVIDER_SITE_OTHER): Payer: BLUE CROSS/BLUE SHIELD | Admitting: Family Medicine

## 2018-02-21 VITALS — BP 145/83 | HR 66 | Temp 98.2°F | Ht 64.0 in | Wt 215.0 lb

## 2018-02-21 DIAGNOSIS — F3289 Other specified depressive episodes: Secondary | ICD-10-CM

## 2018-02-21 DIAGNOSIS — E8881 Metabolic syndrome: Secondary | ICD-10-CM

## 2018-02-21 DIAGNOSIS — R030 Elevated blood-pressure reading, without diagnosis of hypertension: Secondary | ICD-10-CM

## 2018-02-21 DIAGNOSIS — Z6836 Body mass index (BMI) 36.0-36.9, adult: Secondary | ICD-10-CM

## 2018-02-23 NOTE — Progress Notes (Signed)
Office: [phone]  /  Fax: [phone]   HPI:   Chief Complaint: OBESITY Theresa Mathews is here to discuss her progress with her obesity treatment plan. She is on the Category 2 plan and is following her eating plan approximately 50 % of the time. She states she is exercising 0 minutes 0 times per week. Theresa Mathews struggles with not eating on the plan in social situations. Her job is stressful and contributes to stress eating.  Her weight is 215 lb (97.5 kg) today and has had a weight loss of 1 pound over a period of 2 weeks since her last visit. She has lost 5 lbs since starting treatment with [name].  Depression with emotional eating behaviors Theresa Mathews is struggling with emotional eating and using food for comfort to the extent that it is negatively impacting her health. She often snacks when she is not hungry. Theresa Mathews sometimes feels she is out of control and then feels guilty that she made poor food choices. She has been working on behavior modification techniques to help reduce her emotional eating and has been somewhat successful. Her bupropion was increased to 200mg  at the last visit. She is also taking citalopram 10mg . She saw Dr. Mallie Mussel and completed her therapy, but still reports significant stress eating.   Elevated Blood Pressure Without Diagnosis of Hypertension Theresa Mathews's bupropion was increased last visit to 200mg  BID which could be elevating her blood pressure. She denies chest pain, shortness of breath, and headache.  Insulin Resistance Theresa Mathews has a diagnosis of insulin resistance based on her elevated fasting insulin level >5. Although Theresa Mathews's blood glucose readings are still under good control, insulin resistance puts her at greater risk of metabolic syndrome and diabetes. She is not taking metformin currently and continues to work on diet and exercise to decrease risk of diabetes.She admits cravings. We discussed metformin and may consider adding if the increased dose of bupropion does not  help with cravings.  ALLERGIES: No Known Allergies  MEDICATIONS: Current Outpatient Medications on File Prior to Visit  Medication Sig Dispense Refill  . buPROPion (WELLBUTRIN SR) 200 MG 12 hr tablet Take 1 tablet (200 mg total) by mouth 2 (two) times daily. 60 tablet 0  . citalopram (CELEXA) 10 MG tablet Take 1 tablet by mouth daily.    . ranitidine (ZANTAC) 75 MG tablet Take 75 mg by mouth daily at 6 (six) AM.     . Vitamin D, Ergocalciferol, (DRISDOL) 50000 units CAPS capsule Take 1 capsule (50,000 Units total) by mouth every 7 (seven) days. 4 capsule 0   Current Facility-Administered Medications on File Prior to Visit  Medication Dose Route Frequency Provider Last Rate Last Dose  . 0.9 %  sodium chloride infusion  500 mL Intravenous Once [name], MD        PAST MEDICAL HISTORY: Past Medical History:  Diagnosis Date  . Anxiety   . Back pain   . Depression   . Fatigue   . GERD (gastroesophageal reflux disease)   . Lactose intolerance     PAST SURGICAL HISTORY: Past Surgical History:  Procedure Laterality Date  . BLADDER SUSPENSION N/A 01/23/2015   Procedure: TRANSVAGINAL TAPE (TVT) PROCEDURE;  Surgeon: Bobbye Charleston, MD;  Location: [facility];  Service: Gynecology;  Laterality: N/A;  . CYSTOSCOPY N/A 01/23/2015   Procedure: CYSTOSCOPY;  Surgeon: Bobbye Charleston, MD;  Location: [facility];  Service: Gynecology;  Laterality: N/A;  . DILATION AND CURETTAGE OF UTERUS  2005  . ROBOTIC ASSISTED TOTAL HYSTERECTOMY WITH BILATERAL  SALPINGO OOPHERECTOMY Bilateral 01/23/2015   Procedure: ROBOTIC ASSISTED TOTAL HYSTERECTOMY WITH BILATERAL SALPINGO OOPHORECTOMY;  Surgeon: Bobbye Charleston, MD;  Location: [facility];  Service: Gynecology;  Laterality: Bilateral;    SOCIAL HISTORY: Social History   Tobacco Use  . Smoking status: Former Smoker    Packs/day: 0.50    Years: 4.00    Pack years: 2.00  . Smokeless tobacco: Never Used  . Tobacco comment: smoked in college  Substance Use  Topics  . Alcohol use: Yes    Alcohol/week: 5.0 standard drinks    Types: 5 Glasses of wine per week  . Drug use: No    FAMILY HISTORY: Family History  Problem Relation Age of Onset  . Hypertension Mother   . Ovarian cancer Mother   . Heart disease Mother 26       a fib  . Obesity Mother   . Hypertension Father   . Diabetes Father   . Hyperlipidemia Father   . Obesity Father     ROS: Review of Systems  Constitutional: Positive for weight loss.  Respiratory: Negative for shortness of breath.   Cardiovascular: Negative for chest pain.  Neurological: Negative for headaches.  Psychiatric/Behavioral: Positive for depression.    PHYSICAL EXAM: Blood pressure (!) 145/83, pulse 66, temperature 98.2 F (36.8 C), temperature source Oral, height 5\' 4"  (1.626 m), weight 215 lb (97.5 kg), SpO2 99 %. Body mass index is 36.9 kg/m. Physical Exam  Constitutional: She is oriented to person, place, and time. She appears well-developed and well-nourished.  Cardiovascular: Normal rate.  Musculoskeletal: Normal range of motion.  Neurological: She is oriented to person, place, and time.  Skin: Skin is warm and dry.  Psychiatric: She has a normal mood and affect. Her behavior is normal.  Vitals reviewed.   RECENT LABS AND TESTS: BMET    Component Value Date/Time   NA 143 11/24/2017 0839   K 4.7 11/24/2017 0839   CL 101 11/24/2017 0839   CO2 26 11/24/2017 0839   GLUCOSE 98 11/24/2017 0839   GLUCOSE 97 07/12/2017 1011   BUN 14 11/24/2017 0839   CREATININE 0.85 11/24/2017 0839   CALCIUM 9.5 11/24/2017 0839   GFRNONAA 80 11/24/2017 0839   GFRAA 92 11/24/2017 0839   Lab Results  Component Value Date   HGBA1C 5.4 11/24/2017   HGBA1C 5.3 08/26/2017   Lab Results  Component Value Date   INSULIN 11.2 11/24/2017   INSULIN 9.4 08/26/2017   CBC    Component Value Date/Time   WBC 8.5 07/12/2017 1011   RBC 4.47 07/12/2017 1011   HGB 13.7 07/12/2017 1011   HCT 39.8 07/12/2017  1011   PLT 383.0 07/12/2017 1011   MCV 88.9 07/12/2017 1011   MCH 31.0 01/08/2015 0840   MCHC 34.5 07/12/2017 1011   RDW 13.7 07/12/2017 1011   LYMPHSABS 2.1 07/12/2017 1011   MONOABS 0.5 07/12/2017 1011   EOSABS 0.2 07/12/2017 1011   BASOSABS 0.1 07/12/2017 1011   Iron/TIBC/Ferritin/ %Sat No results found for: IRON, TIBC, FERRITIN, IRONPCTSAT Lipid Panel     Component Value Date/Time   CHOL 183 07/12/2017 1011   TRIG 106.0 07/12/2017 1011   HDL 49.30 07/12/2017 1011   CHOLHDL 4 07/12/2017 1011   VLDL 21.2 07/12/2017 1011   LDLCALC 113 (H) 07/12/2017 1011   Hepatic Function Panel     Component Value Date/Time   PROT 7.0 11/24/2017 0839   ALBUMIN 4.3 11/24/2017 0839   AST 16 11/24/2017 0839   ALT  13 11/24/2017 0839   ALKPHOS 105 11/24/2017 0839   BILITOT 0.6 11/24/2017 0839   BILIDIR 0.0 02/05/2011 0815      Component Value Date/Time   TSH 1.290 08/26/2017 0918   TSH 1.27 07/12/2017 1011   TSH 1.27 02/05/2011 0815   Results for SHARI, NATT (MRN [id number]) as of 02/23/2018 16:03  Ref. Range 11/24/2017 08:39  Vitamin D, 25-Hydroxy Latest Ref Range: 30.0 - 100.0 ng/mL 26.7 (L)   ASSESSMENT AND PLAN: Elevated BP without diagnosis of hypertension  Insulin resistance  Other depression - with emotional eating  Class 2 severe obesity with serious comorbidity and body mass index (BMI) of 36.0 to 36.9 in adult, unspecified obesity type (Theresa Mathews)  PLAN:  Elevated Blood Pressure Without Diagnosis of Hypertension Theresa Mathews will monitor blood pressure at home 3 times a week. We will continue to monitor blood pressure and she agrees to follow up in 3 weeks.  Insulin Resistance Theresa Mathews will continue to work on weight loss, exercise, and decreasing simple carbohydrates in her diet to help decrease the risk of diabetes. We discussed metformin including benefits and risks but she would like to defer metformin for now. Theresa Mathews agreed to follow up with [name] as directed to monitor her  progress.  Depression with Emotional Eating Behaviors We discussed behavior modification techniques today to help Theresa Mathews deal with her emotional eating and depression. She has agreed to continue to take bupropion and citalopram and agreed to follow up as directed in 2 weeks.  I spent > than 50% of the 15 minute visit on counseling as documented in the note.  Obesity Theresa Mathews is currently in the action stage of change. As such, her goal is to continue with weight loss efforts. She has agreed to follow the Category 2 plan and keeping a food journal of 400 to 500 calories and 25 to 30 grams of protein daily. Tyneshia has been instructed to walk a few days per week at lunchtime. We discussed the following Behavioral Modification Strategies today: holiday eating strategies and planning for success.  Andreah has agreed to follow up with our clinic in 3 weeks. She was informed of the importance of frequent follow up visits to maximize her success with intensive lifestyle modifications for her multiple health conditions.   OBESITY BEHAVIORAL INTERVENTION VISIT  Today's visit was # 17   Starting weight: 220 lbs Starting date: 08/26/17 Today's weight : Weight: 215 lb (97.5 kg)  Today's date: 02/21/2018 Total lbs lost to date: 5  ASK: We discussed the diagnosis of obesity with Theresa Mathews today and Theresa Mathews agreed to give [name] permission to discuss obesity behavioral modification therapy today.  ASSESS: Latifah has the diagnosis of obesity and her BMI today is 36.89. Desere is in the action stage of change.   ADVISE: Kiearra was educated on the multiple health risks of obesity as well as the benefit of weight loss to improve her health. She was advised of the need for long term treatment and the importance of lifestyle modifications to improve her current health and to decrease her risk of future health problems.  AGREE: Multiple dietary modification options and treatment options were discussed  and Dolce agreed to follow the recommendations documented in the above note.  ARRANGE: Akshita was educated on the importance of frequent visits to treat obesity as outlined per CMS and USPSTF guidelines and agreed to schedule her next follow up appointment today.  I, Marcille Blanco, am acting as Location manager for [employer],  FNP.  I have reviewed the above documentation for accuracy and completeness, and I agree with the above.  -  , FNP-C.

## 2018-02-28 ENCOUNTER — Encounter (INDEPENDENT_AMBULATORY_CARE_PROVIDER_SITE_OTHER): Payer: Self-pay | Admitting: Family Medicine

## 2018-03-14 ENCOUNTER — Ambulatory Visit (INDEPENDENT_AMBULATORY_CARE_PROVIDER_SITE_OTHER): Payer: BLUE CROSS/BLUE SHIELD | Admitting: Family Medicine

## 2018-03-14 ENCOUNTER — Encounter (INDEPENDENT_AMBULATORY_CARE_PROVIDER_SITE_OTHER): Payer: Self-pay | Admitting: Family Medicine

## 2018-03-14 VITALS — BP 139/82 | HR 63 | Temp 97.7°F | Ht 64.0 in | Wt 215.0 lb

## 2018-03-14 DIAGNOSIS — E8881 Metabolic syndrome: Secondary | ICD-10-CM

## 2018-03-14 DIAGNOSIS — F3289 Other specified depressive episodes: Secondary | ICD-10-CM

## 2018-03-14 DIAGNOSIS — Z6836 Body mass index (BMI) 36.0-36.9, adult: Secondary | ICD-10-CM

## 2018-03-14 DIAGNOSIS — I10 Essential (primary) hypertension: Secondary | ICD-10-CM | POA: Diagnosis not present

## 2018-03-14 DIAGNOSIS — Z9189 Other specified personal risk factors, not elsewhere classified: Secondary | ICD-10-CM

## 2018-03-14 DIAGNOSIS — E559 Vitamin D deficiency, unspecified: Secondary | ICD-10-CM | POA: Diagnosis not present

## 2018-03-14 DIAGNOSIS — E7849 Other hyperlipidemia: Secondary | ICD-10-CM | POA: Diagnosis not present

## 2018-03-14 MED ORDER — METFORMIN HCL 500 MG PO TABS: 500.0000 mg | ORAL_TABLET | Freq: Every day | ORAL | 0 refills | Status: DC

## 2018-03-14 MED ORDER — VITAMIN D (ERGOCALCIFEROL) 1.25 MG (50000 UNIT) PO CAPS: 50000.0000 [IU] | ORAL_CAPSULE | ORAL | 0 refills | Status: DC

## 2018-03-14 NOTE — Progress Notes (Signed)
Office: [phone]  /  Fax: [phone]   HPI:   Chief Complaint: OBESITY Theresa Mathews is here to discuss her progress with her obesity treatment plan. She is on the keep a food journal with 400-500 calories and 25-30 grams of protein at supper daily and follow the Category 2 plan and is following her eating plan approximately 70% of the time. She states she is exercising 0 minutes 0 times per week. Theresa Mathews did well over Thanksgiving. She mostly meets her protein goal.  Her weight is 215 lb (97.5 kg) today and has not lost weight since her last visit. She has lost 5 lbs since starting treatment with [name].  Vitamin D Deficiency Theresa Mathews has a diagnosis of vitamin D deficiency. She is currently taking prescription Vit D, but level is not at goal. Last Vit D was 26.7 on 11/24/17. She denies nausea, vomiting or muscle weakness.  Hyperlipidemia [name] has hyperlipidemia and has been trying to improve her cholesterol levels with intensive lifestyle modification including a low saturated fat diet, exercise and weight loss. She is not on statin and denies any chest pain or shortness of breath. Last LDL was 113, triglycerides was 106, and HDL was 49.  Insulin Resistance Theresa Mathews has a diagnosis of insulin resistance based on her elevated fasting insulin level >5. Although [name]'s blood glucose readings are still under good control, insulin resistance puts her at greater risk of metabolic syndrome and diabetes. She is not on metformin and notes polyphagia after eating carbohydrates. She notes the polyphagia most often after lunch.  and has carbohydrate cravings. She continues to work on diet and exercise to decrease risk of diabetes.  At risk for diabetes Theresa Mathews is at higher than average risk for developing diabetes due to her obesity and insulin resistance. She currently denies polyuria or polydipsia.  Hypertension Theresa Mathews is a 52 y.o. female with hypertension.  Theresa Mathews denies chest pain or  shortness of breath on exertion. She is working weight loss to help control her blood pressure with the goal of decreasing her risk of heart attack and stroke. Theresa Mathews blood pressure is currently controlled. She is not currently on anti-hypertensive medication.  Theresa Mathews states at home blood pressures are 130's/80's and she denies chest pain or shortness of breath.  Depression with emotional eating behaviors Theresa Mathews is stable on bupropion and feels it is helping. Her blood pressure is within normal limits. Theresa Mathews struggles with emotional eating and using food for comfort to the extent that it is negatively impacting her health. She often snacks when she is not hungry. Theresa Mathews sometimes feels she is out of control and then feels guilty that she made poor food choices. She has been working on behavior modification techniques to help reduce her emotional eating and has been somewhat successful. She shows no sign of suicidal or homicidal ideations.  Depression screen Theresa Mathews 2/9 12/02/2017 11/18/2017 11/04/2017 10/18/2017 09/13/2017  Decreased Interest 1 1 1 1 1   Down, Depressed, Hopeless 1 1 1 1 1   PHQ - 2 Score 2 2 2 2 2   Altered sleeping 1 1 1 1 2   Tired, decreased energy 1 1 2 1 3   Change in appetite 0 1 1 2 1   Feeling bad or failure about yourself  1 1 1 1 1   Trouble concentrating 0 1 1 0 0  Moving slowly or fidgety/restless 0 0 0 0 0  Suicidal thoughts 0 0 0 0 0  PHQ-9 Score 5 7 8 7 9   Difficult  doing work/chores - - - - -    ALLERGIES: No Known Allergies  MEDICATIONS: Current Outpatient Medications on File Prior to Visit  Medication Sig Dispense Refill  . buPROPion (WELLBUTRIN SR) 200 MG 12 hr tablet Take 1 tablet (200 mg total) by mouth 2 (two) times daily. 60 tablet 0  . citalopram (CELEXA) 10 MG tablet Take 1 tablet by mouth daily.    . ranitidine (ZANTAC) 75 MG tablet Take 75 mg by mouth daily at 6 (six) AM.      Current Facility-Administered Medications on File Prior to Visit  Medication  Dose Route Frequency Provider Last Rate Last Dose  . 0.9 %  sodium chloride infusion  500 mL Intravenous Once [name], MD        PAST MEDICAL HISTORY: Past Medical History:  Diagnosis Date  . Anxiety   . Back pain   . Depression   . Fatigue   . GERD (gastroesophageal reflux disease)   . Lactose intolerance     PAST SURGICAL HISTORY: Past Surgical History:  Procedure Laterality Date  . BLADDER SUSPENSION N/A 01/23/2015   Procedure: TRANSVAGINAL TAPE (TVT) PROCEDURE;  Surgeon: Bobbye Charleston, MD;  Location: [facility];  Service: Gynecology;  Laterality: N/A;  . CYSTOSCOPY N/A 01/23/2015   Procedure: CYSTOSCOPY;  Surgeon: Bobbye Charleston, MD;  Location: [facility];  Service: Gynecology;  Laterality: N/A;  . DILATION AND CURETTAGE OF UTERUS  2005  . ROBOTIC ASSISTED TOTAL HYSTERECTOMY WITH BILATERAL SALPINGO OOPHERECTOMY Bilateral 01/23/2015   Procedure: ROBOTIC ASSISTED TOTAL HYSTERECTOMY WITH BILATERAL SALPINGO OOPHORECTOMY;  Surgeon: Bobbye Charleston, MD;  Location: [facility];  Service: Gynecology;  Laterality: Bilateral;    SOCIAL HISTORY: Social History   Tobacco Use  . Smoking status: Former Smoker    Packs/day: 0.50    Years: 4.00    Pack years: 2.00  . Smokeless tobacco: Never Used  . Tobacco comment: smoked in college  Substance Use Topics  . Alcohol use: Yes    Alcohol/week: 5.0 standard drinks    Types: 5 Glasses of wine per week  . Drug use: No    FAMILY HISTORY: Family History  Problem Relation Age of Onset  . Hypertension Mother   . Ovarian cancer Mother   . Heart disease Mother 48       a fib  . Obesity Mother   . Hypertension Father   . Diabetes Father   . Hyperlipidemia Father   . Obesity Father     ROS: Review of Systems  Constitutional: Negative for weight loss.  Respiratory: Negative for shortness of breath.   Cardiovascular: Negative for chest pain.  Gastrointestinal: Negative for nausea and vomiting.  Genitourinary: Negative for  frequency.  Musculoskeletal:       Negative muscle weakness  Endo/Heme/Allergies: Negative for polydipsia.       Positive polyphagia  Psychiatric/Behavioral: Positive for depression. Negative for suicidal ideas.    PHYSICAL EXAM: Blood pressure 139/82, pulse 63, temperature 97.7 F (36.5 C), temperature source Oral, height 5\' 4"  (1.626 m), weight 215 lb (97.5 kg), SpO2 99 %. Body mass index is 36.9 kg/m. Physical Exam Vitals signs reviewed.  Constitutional:      Appearance: Normal appearance. She is obese.  Cardiovascular:     Rate and Rhythm: Normal rate.  Pulmonary:     Effort: Pulmonary effort is normal.  Musculoskeletal: Normal range of motion.  Skin:    General: Skin is warm and dry.  Neurological:     Mental Status: She is  alert and oriented to person, place, and time.  Psychiatric:        Mood and Affect: Mood normal.        Behavior: Behavior normal.     RECENT LABS AND TESTS: BMET    Component Value Date/Time   NA 143 11/24/2017 0839   K 4.7 11/24/2017 0839   CL 101 11/24/2017 0839   CO2 26 11/24/2017 0839   GLUCOSE 98 11/24/2017 0839   GLUCOSE 97 07/12/2017 1011   BUN 14 11/24/2017 0839   CREATININE 0.85 11/24/2017 0839   CALCIUM 9.5 11/24/2017 0839   GFRNONAA 80 11/24/2017 0839   GFRAA 92 11/24/2017 0839   Lab Results  Component Value Date   HGBA1C 5.4 11/24/2017   HGBA1C 5.3 08/26/2017   Lab Results  Component Value Date   INSULIN 11.2 11/24/2017   INSULIN 9.4 08/26/2017   CBC    Component Value Date/Time   WBC 8.5 07/12/2017 1011   RBC 4.47 07/12/2017 1011   HGB 13.7 07/12/2017 1011   HCT 39.8 07/12/2017 1011   PLT 383.0 07/12/2017 1011   MCV 88.9 07/12/2017 1011   MCH 31.0 01/08/2015 0840   MCHC 34.5 07/12/2017 1011   RDW 13.7 07/12/2017 1011   LYMPHSABS 2.1 07/12/2017 1011   MONOABS 0.5 07/12/2017 1011   EOSABS 0.2 07/12/2017 1011   BASOSABS 0.1 07/12/2017 1011   Iron/TIBC/Ferritin/ %Sat No results found for: IRON, TIBC,  FERRITIN, IRONPCTSAT Lipid Panel     Component Value Date/Time   CHOL 183 07/12/2017 1011   TRIG 106.0 07/12/2017 1011   HDL 49.30 07/12/2017 1011   CHOLHDL 4 07/12/2017 1011   VLDL 21.2 07/12/2017 1011   LDLCALC 113 (H) 07/12/2017 1011   Hepatic Function Panel     Component Value Date/Time   PROT 7.0 11/24/2017 0839   ALBUMIN 4.3 11/24/2017 0839   AST 16 11/24/2017 0839   ALT 13 11/24/2017 0839   ALKPHOS 105 11/24/2017 0839   BILITOT 0.6 11/24/2017 0839   BILIDIR 0.0 02/05/2011 0815      Component Value Date/Time   TSH 1.290 08/26/2017 0918   TSH 1.27 07/12/2017 1011   TSH 1.27 02/05/2011 0815  Results for Theresa Mathews, Theresa Mathews (MRN [id number]) as of 03/14/2018 16:09  Ref. Range 11/24/2017 08:39  Vitamin D, 25-Hydroxy Latest Ref Range: 30.0 - 100.0 ng/mL 26.7 (L)    ASSESSMENT AND PLAN: Vitamin D deficiency - Plan: VITAMIN D 25 Hydroxy (Vit-D Deficiency, Fractures), Vitamin D, Ergocalciferol, (DRISDOL) 1.25 MG (50000 UT) CAPS capsule  Other hyperlipidemia - Plan: Lipid Panel With LDL/HDL Ratio  Insulin resistance - Plan: Comprehensive metabolic panel, Hemoglobin A1c, Insulin, random, metFORMIN (GLUCOPHAGE) 500 MG tablet  Elevated blood pressure reading  Other depression - with emotional eating  At risk for diabetes mellitus  Class 2 severe obesity with serious comorbidity and body mass index (BMI) of 36.0 to 36.9 in adult, unspecified obesity type ([place])  PLAN:  Vitamin D Deficiency Shalissa was informed that low vitamin D levels contributes to fatigue and are associated with obesity, breast, and colon cancer. Kayslee agrees to continue taking prescription Vit D @50 ,000 IU every week #4 and we will refill for 1 month. She will follow up for routine testing of vitamin D, at least 2-3 times per year. She was informed of the risk of over-replacement of vitamin D and agrees to not increase her dose unless she discusses this with [name] first. We will check Vit D level today. Casi  agrees to follow  up with our clinic in 3 weeks.  Hyperlipidemia Theresa Mathews was informed of the American Heart Association Guidelines emphasizing intensive lifestyle modifications as the first line treatment for hyperlipidemia. We discussed many lifestyle modifications today in depth, and Theresa Mathews will continue to work on decreasing saturated fats such as fatty red meat, butter and many fried foods. She will also increase vegetables and lean protein in her diet and continue to work on exercise and weight loss efforts. We will check FLP today and Theresa Mathews agrees to follow up with our clinic in 3 weeks.  Insulin Resistance Theresa Mathews will continue to work on weight loss, exercise, and decreasing simple carbohydrates in her diet to help decrease the risk of diabetes. We dicussed metformin including benefits and risks. She was informed that eating too many simple carbohydrates or too many calories at one sitting increases the likelihood of GI side effects. Theresa Mathews agrees to start metformin 500 mg with lunch daily #30 with no refills. We will check fasting glucose, insulin, and A1c today. Theresa Mathews agrees to follow up with our clinic in 3 weeks as directed to monitor her progress.  Diabetes risk counselling Theresa Mathews was given extended (15 minutes) diabetes prevention counseling today. She is 52 y.o. female and has risk factors for diabetes including obesity and insulin resistance. We discussed intensive lifestyle modifications today with an emphasis on weight loss as well as increasing exercise and decreasing simple carbohydrates in her diet.  Hypertension We discussed sodium restriction and working on healthy weight loss as the means to achieve improved blood pressure control. Theresa Mathews agreed with this plan and agreed to follow up as directed. We will continue to monitor her blood pressure as well as her progress with the above lifestyle modifications.  We will continue to monitor and Theresa Mathews agrees to follow up with our clinic  in 3 weeks.  Depression with Emotional Eating Behaviors We discussed behavior modification techniques today to help Anisha deal with her emotional eating and depression. Theresa Mathews agrees to continue taking Wellbutrin SR and she agrees to follow up with our clinic in 3 weeks.  Obesity Theresa Mathews is currently in the action stage of change. As such, her goal is to continue with weight loss efforts She has agreed to keep a food journal with 1400-1500 calories and 85 grams of protein daily and follow the Category 2 plan Theresa Mathews has not been prescribed exercise at this time. We discussed the following Behavioral Modification Strategies today: increasing lean protein intake and planning for success   Shatona has agreed to follow up with our clinic in 3 weeks. She was informed of the importance of frequent follow up visits to maximize her success with intensive lifestyle modifications for her multiple health conditions.   OBESITY BEHAVIORAL INTERVENTION VISIT  Today's visit was # 18  Starting weight: 220 lbs Starting date: 08/26/17 Today's weight : 215 lbs Today's date: 03/14/2018 Total lbs lost to date: 5    ASK: We discussed the diagnosis of obesity with Jannette Fogo today and Charyl agreed to give [name] permission to discuss obesity behavioral modification therapy today.  ASSESS: Cattaleya has the diagnosis of obesity and her BMI today is 36.89 Jaine is in the action stage of change   ADVISE: Brittlyn was educated on the multiple health risks of obesity as well as the benefit of weight loss to improve her health. She was advised of the need for long term treatment and the importance of lifestyle modifications to improve her current health and to decrease her risk  of future health problems.  AGREE: Multiple dietary modification options and treatment options were discussed and  Daryle agreed to follow the recommendations documented in the above note.  ARRANGE: Dianne was educated on the  importance of frequent visits to treat obesity as outlined per CMS and USPSTF guidelines and agreed to schedule her next follow up appointment today.  Wilhemena Durie, am acting as Location manager for [employer], FNP-C.  I have reviewed the above documentation for accuracy and completeness, and I agree with the above.  - Coner Gibbard, FNP-C.

## 2018-03-15 ENCOUNTER — Encounter (INDEPENDENT_AMBULATORY_CARE_PROVIDER_SITE_OTHER): Payer: Self-pay | Admitting: Family Medicine

## 2018-03-15 DIAGNOSIS — I10 Essential (primary) hypertension: Secondary | ICD-10-CM | POA: Insufficient documentation

## 2018-03-15 LAB — VITAMIN D 25 HYDROXY (VIT D DEFICIENCY, FRACTURES): Vit D, 25-Hydroxy: 21.4 ng/mL — ABNORMAL LOW (ref 30.0–100.0)

## 2018-03-15 LAB — COMPREHENSIVE METABOLIC PANEL
ALT: 15 IU/L (ref 0–32)
AST: 17 IU/L (ref 0–40)
Albumin/Globulin Ratio: 1.6 (ref 1.2–2.2)
Albumin: 4.5 g/dL (ref 3.5–5.5)
Alkaline Phosphatase: 102 IU/L (ref 39–117)
BUN/Creatinine Ratio: 17 (ref 9–23)
BUN: 15 mg/dL (ref 6–24)
Bilirubin Total: 0.3 mg/dL (ref 0.0–1.2)
CO2: 26 mmol/L (ref 20–29)
Calcium: 9.2 mg/dL (ref 8.7–10.2)
Chloride: 101 mmol/L (ref 96–106)
Creatinine, Ser: 0.9 mg/dL (ref 0.57–1.00)
GFR calc Af Amer: 86 mL/min/{1.73_m2} (ref >59)
GFR calc non Af Amer: 74 mL/min/{1.73_m2} (ref >59)
Globulin, Total: 2.8 g/dL (ref 1.5–4.5)
Glucose: 96 mg/dL (ref 65–99)
Potassium: 4.9 mmol/L (ref 3.5–5.2)
Sodium: 141 mmol/L (ref 134–144)
Total Protein: 7.3 g/dL (ref 6.0–8.5)

## 2018-03-15 LAB — LIPID PANEL WITH LDL/HDL RATIO
Cholesterol, Total: 219 mg/dL — ABNORMAL HIGH (ref 100–199)
HDL: 60 mg/dL (ref >39)
LDL Calculated: 132 mg/dL — ABNORMAL HIGH (ref 0–99)
LDl/HDL Ratio: 2.2 ratio (ref 0.0–3.2)
Triglycerides: 135 mg/dL (ref 0–149)
VLDL Cholesterol Cal: 27 mg/dL (ref 5–40)

## 2018-03-15 LAB — HEMOGLOBIN A1C
Est. average glucose Bld gHb Est-mCnc: 108 mg/dL
Hgb A1c MFr Bld: 5.4 % (ref 4.8–5.6)

## 2018-03-15 LAB — INSULIN, RANDOM: INSULIN: 9.5 u[IU]/mL (ref 2.6–24.9)

## 2018-04-07 ENCOUNTER — Ambulatory Visit (INDEPENDENT_AMBULATORY_CARE_PROVIDER_SITE_OTHER): Payer: BLUE CROSS/BLUE SHIELD | Admitting: Family Medicine

## 2018-04-07 ENCOUNTER — Encounter (INDEPENDENT_AMBULATORY_CARE_PROVIDER_SITE_OTHER): Payer: Self-pay | Admitting: Family Medicine

## 2018-04-07 VITALS — BP 141/80 | HR 69 | Temp 97.9°F | Ht 64.0 in | Wt 216.0 lb

## 2018-04-07 DIAGNOSIS — E8881 Metabolic syndrome: Secondary | ICD-10-CM

## 2018-04-07 DIAGNOSIS — Z6837 Body mass index (BMI) 37.0-37.9, adult: Secondary | ICD-10-CM

## 2018-04-07 DIAGNOSIS — Z9189 Other specified personal risk factors, not elsewhere classified: Secondary | ICD-10-CM

## 2018-04-07 DIAGNOSIS — F3289 Other specified depressive episodes: Secondary | ICD-10-CM

## 2018-04-07 MED ORDER — METFORMIN HCL 500 MG PO TABS: 500.0000 mg | ORAL_TABLET | Freq: Every day | ORAL | 0 refills | Status: DC

## 2018-04-07 MED ORDER — BUPROPION HCL ER (SR) 200 MG PO TB12: 200.0000 mg | ORAL_TABLET | Freq: Two times a day (BID) | ORAL | 0 refills | Status: DC

## 2018-04-09 NOTE — Progress Notes (Signed)
Office: [phone]  /  Fax: [phone]   HPI:   Chief Complaint: OBESITY Theresa Mathews is here to discuss her progress with her obesity treatment plan. She is on the keep a food journal with 1400-1500 calories and 85 grams of protein daily or follow the Category 2 plan and is following her eating plan approximately 50% of the time. She states she is walking for 20-30 minutes 3 times per week. Theresa Mathews notes that journaling seems to increase her compliance with diet. She has been off track but is back on track.  Her weight is 216 lb (98 kg) today and has gained 1 pound since her last visit. She has lost 4 lbs since starting treatment with [name].  Insulin Resistance Theresa Mathews has a diagnosis of insulin resistance based on her elevated fasting insulin level >5. Although Theresa Mathews's blood glucose readings are still under good control, insulin resistance puts her at greater risk of metabolic syndrome and diabetes. She is on metformin. She notes polyphagia and denies nausea, vomiting, or diarrhea. She continues to work on diet and exercise to decrease risk of diabetes.  Depression with emotional eating behaviors Theresa Mathews is on bupropion, increased dose of bupropion is increasing energy. She notes cravings are decreasing. She does not always remember to take her 2nd dose.  She shows no sign of suicidal or homicidal ideations.  Depression screen Theresa Mathews 2/9 12/02/2017 11/18/2017 11/04/2017 10/18/2017 09/13/2017  Decreased Interest 1 1 1 1 1   Down, Depressed, Hopeless 1 1 1 1 1   PHQ - 2 Score 2 2 2 2 2   Altered sleeping 1 1 1 1 2   Tired, decreased energy 1 1 2 1 3   Change in appetite 0 1 1 2 1   Feeling bad or failure about yourself  1 1 1 1 1   Trouble concentrating 0 1 1 0 0  Moving slowly or fidgety/restless 0 0 0 0 0  Suicidal thoughts 0 0 0 0 0  PHQ-9 Score 5 7 8 7 9   Difficult doing work/chores - - - - -    At risk for osteopenia and osteoporosis Theresa Mathews is at higher risk of osteopenia and osteoporosis due to  vitamin D deficiency.   ALLERGIES: No Known Allergies  MEDICATIONS: Current Outpatient Medications on File Prior to Visit  Medication Sig Dispense Refill  . citalopram (CELEXA) 10 MG tablet Take 1 tablet by mouth daily.    . ranitidine (ZANTAC) 75 MG tablet Take 75 mg by mouth daily at 6 (six) AM.     . Vitamin D, Ergocalciferol, (DRISDOL) 1.25 MG (50000 UT) CAPS capsule Take 1 capsule (50,000 Units total) by mouth every 7 (seven) days. 4 capsule 0   Current Facility-Administered Medications on File Prior to Visit  Medication Dose Route Frequency Provider Last Rate Last Dose  . 0.9 %  sodium chloride infusion  500 mL Intravenous Once [name], MD        PAST MEDICAL HISTORY: Past Medical History:  Diagnosis Date  . Anxiety   . Back pain   . Depression   . Fatigue   . GERD (gastroesophageal reflux disease)   . Lactose intolerance     PAST SURGICAL HISTORY: Past Surgical History:  Procedure Laterality Date  . BLADDER SUSPENSION N/A 01/23/2015   Procedure: TRANSVAGINAL TAPE (TVT) PROCEDURE;  Surgeon: Bobbye Charleston, MD;  Location: [facility];  Service: Gynecology;  Laterality: N/A;  . CYSTOSCOPY N/A 01/23/2015   Procedure: CYSTOSCOPY;  Surgeon: Bobbye Charleston, MD;  Location: [facility];  Service: Gynecology;  Laterality: N/A;  . DILATION AND CURETTAGE OF UTERUS  2005  . ROBOTIC ASSISTED TOTAL HYSTERECTOMY WITH BILATERAL SALPINGO OOPHERECTOMY Bilateral 01/23/2015   Procedure: ROBOTIC ASSISTED TOTAL HYSTERECTOMY WITH BILATERAL SALPINGO OOPHORECTOMY;  Surgeon: Bobbye Charleston, MD;  Location: [facility];  Service: Gynecology;  Laterality: Bilateral;    SOCIAL HISTORY: Social History   Tobacco Use  . Smoking status: Former Smoker    Packs/day: 0.50    Years: 4.00    Pack years: 2.00  . Smokeless tobacco: Never Used  . Tobacco comment: smoked in college  Substance Use Topics  . Alcohol use: Yes    Alcohol/week: 5.0 standard drinks    Types: 5 Glasses of wine per week  .  Drug use: No    FAMILY HISTORY: Family History  Problem Relation Age of Onset  . Hypertension Mother   . Ovarian cancer Mother   . Heart disease Mother 78       a fib  . Obesity Mother   . Hypertension Father   . Diabetes Father   . Hyperlipidemia Father   . Obesity Father     ROS: Review of Systems  Constitutional: Negative for weight loss.  Gastrointestinal: Negative for diarrhea, nausea and vomiting.  Endo/Heme/Allergies:       Positive polyphagia  Psychiatric/Behavioral: Positive for depression. Negative for suicidal ideas.    PHYSICAL EXAM: Blood pressure (!) 141/80, pulse 69, temperature 97.9 F (36.6 C), temperature source Oral, height 5\' 4"  (1.626 m), weight 216 lb (98 kg), SpO2 100 %. Body mass index is 37.08 kg/m. Physical Exam Vitals signs reviewed.  Constitutional:      Appearance: Normal appearance. She is obese.  Cardiovascular:     Rate and Rhythm: Normal rate.     Pulses: Normal pulses.  Pulmonary:     Effort: Pulmonary effort is normal.     Breath sounds: Normal breath sounds.  Musculoskeletal: Normal range of motion.  Skin:    General: Skin is warm and dry.  Neurological:     Mental Status: She is alert and oriented to person, place, and time.  Psychiatric:        Mood and Affect: Mood normal.        Behavior: Behavior normal.     RECENT LABS AND TESTS: BMET    Component Value Date/Time   NA 141 03/14/2018 0835   K 4.9 03/14/2018 0835   CL 101 03/14/2018 0835   CO2 26 03/14/2018 0835   GLUCOSE 96 03/14/2018 0835   GLUCOSE 97 07/12/2017 1011   BUN 15 03/14/2018 0835   CREATININE 0.90 03/14/2018 0835   CALCIUM 9.2 03/14/2018 0835   GFRNONAA 74 03/14/2018 0835   GFRAA 86 03/14/2018 0835   Lab Results  Component Value Date   HGBA1C 5.4 03/14/2018   HGBA1C 5.4 11/24/2017   HGBA1C 5.3 08/26/2017   Lab Results  Component Value Date   INSULIN 9.5 03/14/2018   INSULIN 11.2 11/24/2017   INSULIN 9.4 08/26/2017   CBC    Component  Value Date/Time   WBC 8.5 07/12/2017 1011   RBC 4.47 07/12/2017 1011   HGB 13.7 07/12/2017 1011   HCT 39.8 07/12/2017 1011   PLT 383.0 07/12/2017 1011   MCV 88.9 07/12/2017 1011   MCH 31.0 01/08/2015 0840   MCHC 34.5 07/12/2017 1011   RDW 13.7 07/12/2017 1011   LYMPHSABS 2.1 07/12/2017 1011   MONOABS 0.5 07/12/2017 1011   EOSABS 0.2 07/12/2017 1011   BASOSABS 0.1 07/12/2017 1011  Iron/TIBC/Ferritin/ %Sat No results found for: IRON, TIBC, FERRITIN, IRONPCTSAT Lipid Panel     Component Value Date/Time   CHOL 219 (H) 03/14/2018 0835   TRIG 135 03/14/2018 0835   HDL 60 03/14/2018 0835   CHOLHDL 4 07/12/2017 1011   VLDL 21.2 07/12/2017 1011   LDLCALC 132 (H) 03/14/2018 0835   Hepatic Function Panel     Component Value Date/Time   PROT 7.3 03/14/2018 0835   ALBUMIN 4.5 03/14/2018 0835   AST 17 03/14/2018 0835   ALT 15 03/14/2018 0835   ALKPHOS 102 03/14/2018 0835   BILITOT 0.3 03/14/2018 0835   BILIDIR 0.0 02/05/2011 0815      Component Value Date/Time   TSH 1.290 08/26/2017 0918   TSH 1.27 07/12/2017 1011   TSH 1.27 02/05/2011 0815    ASSESSMENT AND PLAN: Insulin resistance - Plan: metFORMIN (GLUCOPHAGE) 500 MG tablet  Other depression - with emotional eating - Plan: buPROPion (WELLBUTRIN SR) 200 MG 12 hr tablet  At risk for osteoporosis  Class 2 severe obesity with serious comorbidity and body mass index (BMI) of 37.0 to 37.9 in adult, unspecified obesity type ([place])  PLAN:  Insulin Resistance Stela will continue to work on weight loss, exercise, and decreasing simple carbohydrates in her diet to help decrease the risk of diabetes. We dicussed metformin including benefits and risks. She was informed that eating too many simple carbohydrates or too many calories at one sitting increases the likelihood of GI side effects. Makenize agrees to continue taking metformin 500 mg qd #30 and we will refill for 1 month. Lauryl agrees to follow up with our clinic in 3 weeks as  directed to monitor her progress.  Depression with Emotional Eating Behaviors We discussed behavior modification techniques today to help Kirstin deal with her emotional eating and depression. Tenlee agrees to continue taking bupropion 200 mg BID #60 and we will refill for 1 month. We discussed strategies for better compliance with medications. Dominik agrees to follow up with our clinic in 3 weeks.  At risk for osteopenia and osteoporosis Adrionna was given extended (15 minutes) osteoporosis prevention counseling today. Saysha is at risk for osteopenia and osteoporsis due to her vitamin D deficiency. She was encouraged to take her vitamin D and follow her higher calcium diet and increase strengthening exercise to help strengthen her bones and decrease her risk of osteopenia and osteoporosis.  Obesity Tula is currently in the action stage of change. As such, her goal is to continue with weight loss efforts She has agreed to follow the Category 2 plan Chenille has been instructed to work up to a goal of 150 minutes of combined cardio and strengthening exercise per week or as above for weight loss and overall health benefits. We discussed the following Behavioral Modification Strategies today: decreasing simple carbohydrates and planning for success Bethan may eat extra non-starchy vegetables at lunch.  Eleaner has agreed to follow up with our clinic in 3 weeks. She was informed of the importance of frequent follow up visits to maximize her success with intensive lifestyle modifications for her multiple health conditions.   OBESITY BEHAVIORAL INTERVENTION VISIT  Today's visit was # 13  Starting weight: 220 lbs Starting date: 08/26/17 Today's weight : 216 lbs Today's date: 04/07/2018 Total lbs lost to date: 4    ASK: We discussed the diagnosis of obesity with Jannette Fogo today and Sadeen agreed to give [name] permission to discuss obesity behavioral modification therapy  today.  ASSESS: Kyna has  the diagnosis of obesity and her BMI today is 37.06 Leoma is in the action stage of change   ADVISE: Rayyan was educated on the multiple health risks of obesity as well as the benefit of weight loss to improve her health. She was advised of the need for long term treatment and the importance of lifestyle modifications to improve her current health and to decrease her risk of future health problems.  AGREE: Multiple dietary modification options and treatment options were discussed and  Marco agreed to follow the recommendations documented in the above note.  ARRANGE: Lilly was educated on the importance of frequent visits to treat obesity as outlined per CMS and USPSTF guidelines and agreed to schedule her next follow up appointment today.  Wilhemena Durie, am acting as Location manager for [employer], FNP-C.  I have reviewed the above documentation for accuracy and completeness, and I agree with the above.  - Ziggy Chanthavong, FNP-C.

## 2018-04-14 ENCOUNTER — Encounter (INDEPENDENT_AMBULATORY_CARE_PROVIDER_SITE_OTHER): Payer: Self-pay | Admitting: Family Medicine

## 2018-04-14 DIAGNOSIS — E8881 Metabolic syndrome: Secondary | ICD-10-CM | POA: Insufficient documentation

## 2018-04-14 DIAGNOSIS — E88819 Insulin resistance, unspecified: Secondary | ICD-10-CM | POA: Insufficient documentation

## 2018-04-28 ENCOUNTER — Ambulatory Visit (INDEPENDENT_AMBULATORY_CARE_PROVIDER_SITE_OTHER): Payer: BLUE CROSS/BLUE SHIELD | Admitting: Family Medicine

## 2018-04-28 ENCOUNTER — Encounter (INDEPENDENT_AMBULATORY_CARE_PROVIDER_SITE_OTHER): Payer: Self-pay | Admitting: Family Medicine

## 2018-04-28 VITALS — BP 144/85 | HR 68 | Temp 97.9°F | Ht 64.0 in | Wt 214.0 lb

## 2018-04-28 DIAGNOSIS — F3289 Other specified depressive episodes: Secondary | ICD-10-CM | POA: Diagnosis not present

## 2018-04-28 DIAGNOSIS — I10 Essential (primary) hypertension: Secondary | ICD-10-CM | POA: Diagnosis not present

## 2018-04-28 DIAGNOSIS — E559 Vitamin D deficiency, unspecified: Secondary | ICD-10-CM | POA: Insufficient documentation

## 2018-04-28 DIAGNOSIS — Z6836 Body mass index (BMI) 36.0-36.9, adult: Secondary | ICD-10-CM

## 2018-04-28 DIAGNOSIS — Z9189 Other specified personal risk factors, not elsewhere classified: Secondary | ICD-10-CM

## 2018-04-28 MED ORDER — VITAMIN D (ERGOCALCIFEROL) 1.25 MG (50000 UNIT) PO CAPS: 50000.0000 [IU] | ORAL_CAPSULE | ORAL | 0 refills | Status: DC

## 2018-04-28 MED ORDER — LOSARTAN POTASSIUM 50 MG PO TABS: 50.0000 mg | ORAL_TABLET | Freq: Every day | ORAL | 0 refills | Status: DC

## 2018-04-28 NOTE — Progress Notes (Signed)
Office: [phone]  /  Fax: [phone]   HPI:   Chief Complaint: OBESITY Theresa Mathews is here to discuss her progress with her obesity treatment plan. She is on the Category 2 plan and is following her eating plan approximately 70 % of the time. She states she is exercising 0 minutes 0 times per week. Theresa Mathews had recent birthday celebration and was off plan somewhat. Her weight is 214 lb (97.1 kg) today and has had a weight loss of 2 pounds over a period of 3 weeks since her last visit. She has lost 6 lbs since starting treatment with [name].  Vitamin D deficiency Theresa Mathews has a diagnosis of vitamin D deficiency. She is currently taking prescription Vit D and denies nausea, vomiting or muscle weakness. She is not at goal. Her last level was 31.4 on 03/14/2018.   Depression with emotional eating behaviors Theresa Mathews is struggling with emotional eating and using food for comfort to the extent that it is negatively impacting her health. She often snacks when she is not hungry. Theresa Mathews sometimes feels she is out of control and then feels guilty that she made poor food choices. She has been working on behavior modification techniques to help reduce her emotional eating and has been somewhat successful. Theresa Mathews's stress has increased recently at work, which has caused her to be off track at times. Theresa is helping a great deal with stress eating. However, her blood pressure is slightly elevated.  She shows no sign of suicidal or homicidal ideations.  Depression screen Theresa Mathews 2/9 12/02/2017 11/18/2017 11/04/2017 10/18/2017 09/13/2017  Decreased Interest 1 1 1 1 1   Down, Depressed, Hopeless 1 1 1 1 1   PHQ - 2 Score 2 2 2 2 2   Altered sleeping 1 1 1 1 2   Tired, decreased energy 1 1 2 1 3   Change in appetite 0 1 1 2 1   Feeling bad or failure about yourself  1 1 1 1 1   Trouble concentrating 0 1 1 0 0  Moving slowly or fidgety/restless 0 0 0 0 0  Suicidal thoughts 0 0 0 0 0  PHQ-9 Score 5 7 8 7 9   Difficult doing  work/chores - - - - -    Hypertension Theresa Mathews is a 53 y.o. female with hypertension.  Theresa Mathews denies chest pain or shortness of breath. She is working weight loss to help control her blood pressure with the goal of decreasing her risk of heart attack and stroke. Theresa Mathews blood pressure has been consistently slightly elevated over the past few months and she has been reluctant to have an antihypertensive medication added.  At risk for cardiovascular disease Theresa Mathews is at a higher than average risk for cardiovascular disease due to obesity. She currently denies any chest pain.   ASSESSMENT AND PLAN:  Vitamin D deficiency - Plan: Vitamin D, Ergocalciferol, (DRISDOL) 1.25 MG (50000 UT) CAPS capsule  Essential hypertension - Plan: losartan (COZAAR) 50 MG tablet  Other depression - with emotional eating  At risk for heart disease  Class 2 severe obesity with serious comorbidity and body mass index (BMI) of 36.0 to 36.9 in adult, unspecified obesity type (Theresa Mathews)  PLAN:  Vitamin D Deficiency Tamatha was informed that low vitamin D levels contributes to fatigue and are associated with obesity, breast, and colon cancer. She agrees to continue to take prescription Vit D @50 ,000 IU every week #4 with no refills and will follow up for routine testing of vitamin D, at least 2-3  times per year. She was informed of the risk of over-replacement of vitamin D and agrees to not increase her dose unless she discusses this with [name] first. Theresa Mathews agrees to follow up with our clinic in 2 weeks.  Depression with Emotional Eating Behaviors We discussed behavior modification techniques today to help Theresa Mathews deal with her emotional eating and depression. Arianny agrees to continue to take Theresa Mathews 200 mg bid #60 with no refills and follow up with our clinic in 2 weeks.  Hypertension We discussed adding an anti-hypertensive and Theresa Mathews agreed to begin the medication.  Theresa Mathews agrees to start taking  losartan 50 mg qd #30 with no refills and will watch for signs of hypotension as she continues her lifestyle modifications. Theresa Mathews agrees to follow up with our clinic in 2 weeks.  Cardiovascular risk counseling Theresa Mathews was given extended (15 minutes) coronary artery disease prevention counseling today. She is 53 y.o. female and has risk factors for heart disease including obesity. We discussed intensive lifestyle modifications today with an emphasis on specific weight loss instructions and strategies. Theresa Mathews was also informed of the importance of increasing exercise and decreasing saturated fats to help prevent heart disease.  Obesity Theresa Mathews is currently in the action stage of change. As such, her goal is to continue with weight loss efforts She has agreed to follow the Category 2 plan Theresa Mathews has been prescribed exercise. We discussed the following Behavioral Modification Strategies today: increasing lean protein intake and planning for success Theresa Mathews has not been prescribed exercise at this time. Theresa Mathews has agreed to follow up with our clinic in 2 weeks. She was informed of the importance of frequent follow up visits to maximize her success with intensive lifestyle modifications for her multiple health conditions.  ALLERGIES: No Known Allergies  MEDICATIONS: Current Outpatient Medications on File Prior to Visit  Medication Sig Dispense Refill  . Theresa (WELLBUTRIN Mathews) 200 MG 12 hr tablet Take 1 tablet (200 mg total) by mouth 2 (two) times daily. 60 tablet 0  . citalopram (CELEXA) 10 MG tablet Take 1 tablet by mouth daily.    . metFORMIN (GLUCOPHAGE) 500 MG tablet Take 1 tablet (500 mg total) by mouth daily with breakfast. 30 tablet 0  . ranitidine (ZANTAC) 75 MG tablet Take 75 mg by mouth daily at 6 (six) AM.      Current Facility-Administered Medications on File Prior to Visit  Medication Dose Route Frequency Provider Last Rate Last Dose  . 0.9 %  sodium chloride infusion  500 mL  Intravenous Once [name], MD        PAST MEDICAL HISTORY: Past Medical History:  Diagnosis Date  . Anxiety   . Back pain   . Depression   . Fatigue   . GERD (gastroesophageal reflux disease)   . Lactose intolerance     PAST SURGICAL HISTORY: Past Surgical History:  Procedure Laterality Date  . BLADDER SUSPENSION N/A 01/23/2015   Procedure: TRANSVAGINAL TAPE (TVT) PROCEDURE;  Surgeon: Bobbye Charleston, MD;  Location: [facility];  Service: Gynecology;  Laterality: N/A;  . CYSTOSCOPY N/A 01/23/2015   Procedure: CYSTOSCOPY;  Surgeon: Bobbye Charleston, MD;  Location: [facility];  Service: Gynecology;  Laterality: N/A;  . DILATION AND CURETTAGE OF UTERUS  2005  . ROBOTIC ASSISTED TOTAL HYSTERECTOMY WITH BILATERAL SALPINGO OOPHERECTOMY Bilateral 01/23/2015   Procedure: ROBOTIC ASSISTED TOTAL HYSTERECTOMY WITH BILATERAL SALPINGO OOPHORECTOMY;  Surgeon: Bobbye Charleston, MD;  Location: [facility];  Service: Gynecology;  Laterality: Bilateral;    SOCIAL HISTORY:  Social History   Tobacco Use  . Smoking status: Former Smoker    Packs/day: 0.50    Years: 4.00    Pack years: 2.00  . Smokeless tobacco: Never Used  . Tobacco comment: smoked in college  Substance Use Topics  . Alcohol use: Yes    Alcohol/week: 5.0 standard drinks    Types: 5 Glasses of wine per week  . Drug use: No    FAMILY HISTORY: Family History  Problem Relation Age of Onset  . Hypertension Mother   . Ovarian cancer Mother   . Heart disease Mother 19       a fib  . Obesity Mother   . Hypertension Father   . Diabetes Father   . Hyperlipidemia Father   . Obesity Father     ROS: Review of Systems  Constitutional: Positive for weight loss.  Respiratory: Negative for shortness of breath.   Cardiovascular: Negative for chest pain.  Gastrointestinal: Negative for nausea and vomiting.  Musculoskeletal:       Negative for muscle weakness  Psychiatric/Behavioral: Positive for depression. Negative for suicidal  ideas.       Negative for homicidal ideation    PHYSICAL EXAM: Blood pressure (!) 144/85, pulse 68, temperature 97.9 F (36.6 C), height 5\' 4"  (1.626 m), weight 214 lb (97.1 kg), SpO2 98 %. Body mass index is 36.73 kg/m. Physical Exam Vitals signs reviewed.  Constitutional:      Appearance: Normal appearance. She is obese.  Cardiovascular:     Rate and Rhythm: Normal rate.     Pulses: Normal pulses.  Pulmonary:     Effort: Pulmonary effort is normal.  Musculoskeletal: Normal range of motion.  Skin:    General: Skin is warm and dry.  Neurological:     Mental Status: She is alert and oriented to person, place, and time.  Psychiatric:        Mood and Affect: Mood normal.        Behavior: Behavior normal.     RECENT LABS AND TESTS: BMET    Component Value Date/Time   NA 141 03/14/2018 0835   K 4.9 03/14/2018 0835   CL 101 03/14/2018 0835   CO2 26 03/14/2018 0835   GLUCOSE 96 03/14/2018 0835   GLUCOSE 97 07/12/2017 1011   BUN 15 03/14/2018 0835   CREATININE 0.90 03/14/2018 0835   CALCIUM 9.2 03/14/2018 0835   GFRNONAA 74 03/14/2018 0835   GFRAA 86 03/14/2018 0835   Lab Results  Component Value Date   HGBA1C 5.4 03/14/2018   HGBA1C 5.4 11/24/2017   HGBA1C 5.3 08/26/2017   Lab Results  Component Value Date   INSULIN 9.5 03/14/2018   INSULIN 11.2 11/24/2017   INSULIN 9.4 08/26/2017   CBC    Component Value Date/Time   WBC 8.5 07/12/2017 1011   RBC 4.47 07/12/2017 1011   HGB 13.7 07/12/2017 1011   HCT 39.8 07/12/2017 1011   PLT 383.0 07/12/2017 1011   MCV 88.9 07/12/2017 1011   MCH 31.0 01/08/2015 0840   MCHC 34.5 07/12/2017 1011   RDW 13.7 07/12/2017 1011   LYMPHSABS 2.1 07/12/2017 1011   MONOABS 0.5 07/12/2017 1011   EOSABS 0.2 07/12/2017 1011   BASOSABS 0.1 07/12/2017 1011   Iron/TIBC/Ferritin/ %Sat No results found for: IRON, TIBC, FERRITIN, IRONPCTSAT Lipid Panel     Component Value Date/Time   CHOL 219 (H) 03/14/2018 0835   TRIG 135  03/14/2018 0835   HDL 60 03/14/2018 0835   CHOLHDL  4 07/12/2017 1011   VLDL 21.2 07/12/2017 1011   LDLCALC 132 (H) 03/14/2018 0835   Hepatic Function Panel     Component Value Date/Time   PROT 7.3 03/14/2018 0835   ALBUMIN 4.5 03/14/2018 0835   AST 17 03/14/2018 0835   ALT 15 03/14/2018 0835   ALKPHOS 102 03/14/2018 0835   BILITOT 0.3 03/14/2018 0835   BILIDIR 0.0 02/05/2011 0815      Component Value Date/Time   TSH 1.290 08/26/2017 0918   TSH 1.27 07/12/2017 1011   TSH 1.27 02/05/2011 0815    Ref. Range 03/14/2018 08:35  Vitamin D, 25-Hydroxy Latest Ref Range: 30.0 - 100.0 ng/mL 21.4 (L)     OBESITY BEHAVIORAL INTERVENTION VISIT  Today's visit was # 14   Starting weight: 220 lbs Starting date: 08/26/2017 Today's weight :: 214 lbs Today's date: 04/28/2018 Total lbs lost to date: 6   ASK: We discussed the diagnosis of obesity with Theresa Mathews Fogo today and Mirai agreed to give [name] permission to discuss obesity behavioral modification therapy today.  ASSESS: Charron has the diagnosis of obesity and her BMI today is 36.72 Bernise is in the action stage of change   ADVISE: Kai was educated on the multiple health risks of obesity as well as the benefit of weight loss to improve her health. She was advised of the need for long term treatment and the importance of lifestyle modifications to improve her current health and to decrease her risk of future health problems.  AGREE: Multiple dietary modification options and treatment options were discussed and  Phenix agreed to follow the recommendations documented in the above note.  ARRANGE: Katara was educated on the importance of frequent visits to treat obesity as outlined per CMS and USPSTF guidelines and agreed to schedule her next follow up appointment today.  I, Tammy Wysor, am acting as Location manager for [employer].  I have reviewed the above documentation for accuracy and completeness, and I agree  with the above.  - Dawn Whitmire, FNP-C.

## 2018-05-19 ENCOUNTER — Ambulatory Visit (INDEPENDENT_AMBULATORY_CARE_PROVIDER_SITE_OTHER): Payer: Self-pay | Admitting: Family Medicine

## 2018-05-31 ENCOUNTER — Ambulatory Visit (INDEPENDENT_AMBULATORY_CARE_PROVIDER_SITE_OTHER): Payer: BLUE CROSS/BLUE SHIELD | Admitting: Family Medicine

## 2018-05-31 VITALS — BP 133/74 | HR 61 | Temp 97.8°F | Ht 64.0 in | Wt 215.0 lb

## 2018-05-31 DIAGNOSIS — E8881 Metabolic syndrome: Secondary | ICD-10-CM

## 2018-05-31 DIAGNOSIS — Z6836 Body mass index (BMI) 36.0-36.9, adult: Secondary | ICD-10-CM

## 2018-05-31 DIAGNOSIS — F3289 Other specified depressive episodes: Secondary | ICD-10-CM

## 2018-05-31 DIAGNOSIS — Z9189 Other specified personal risk factors, not elsewhere classified: Secondary | ICD-10-CM | POA: Diagnosis not present

## 2018-05-31 DIAGNOSIS — I10 Essential (primary) hypertension: Secondary | ICD-10-CM | POA: Diagnosis not present

## 2018-05-31 MED ORDER — TOPIRAMATE 50 MG PO TABS: 50.0000 mg | ORAL_TABLET | Freq: Every day | ORAL | 0 refills | Status: DC

## 2018-05-31 MED ORDER — METFORMIN HCL 500 MG PO TABS: 500.0000 mg | ORAL_TABLET | Freq: Every day | ORAL | 0 refills | Status: DC

## 2018-05-31 MED ORDER — LOSARTAN POTASSIUM 50 MG PO TABS: 50.0000 mg | ORAL_TABLET | Freq: Every day | ORAL | 0 refills | Status: DC

## 2018-05-31 NOTE — Progress Notes (Addendum)
Office: [phone]  /  Fax: [phone]   HPI:   Chief Complaint: OBESITY Theresa Mathews is here to discuss her progress with her obesity treatment plan. She is on the Category 2 plan and is following her eating plan approximately 70 % of the time. She states she is walking for 20 minutes 1 time per week. Theresa Mathews is struggling with motivation and consistency to stay on the plan. She snacks after dinner when she is not hungry.  Her weight is 215 lb (97.5 kg) today and has gained 1 pound since her last visit. She has lost 5 lbs since starting treatment with [name].  Hypertension [name] is a 53 y.o. female with hypertension. [name]'s blood pressure is well controlled on losartan, which was started last visit. She is tolerating it well, but does report some edema in her hands. She denies chest pain or shortness of breath. She is working on weight loss to help control her blood pressure with the goal of decreasing her risk of heart attack and stroke.   Insulin Resistance Theresa Mathews has a diagnosis of insulin resistance based on her elevated fasting insulin level >5. Although Theresa Mathews's blood glucose readings are still under good control, insulin resistance puts her at greater risk of metabolic syndrome and diabetes. She is taking metformin currently and continues to work on diet and exercise to decrease risk of diabetes. She denies polyphagia.  At risk for diabetes Theresa Mathews is at higher than average risk for developing diabetes due to her obesity and insulin resistance. She currently denies polyuria or polydipsia.  Depression with emotional eating behaviors Theresa Mathews feels bupropion helps with emotional eating. She is struggling quite a bit with after dinner snacking. Theresa Mathews struggles with emotional eating and using food for comfort to the extent that it is negatively impacting her health. She often snacks when she is not hungry. Theresa Mathews sometimes feels she is out of control and then feels guilty that she  made poor food choices. She has been working on behavior modification techniques to help reduce her emotional eating and has been somewhat successful. She shows no sign of suicidal or homicidal ideations.  Depression screen [facility] 2/9 12/02/2017 11/18/2017 11/04/2017 10/18/2017 09/13/2017  Decreased Interest 1 1 1 1 1   Down, Depressed, Hopeless 1 1 1 1 1   PHQ - 2 Score 2 2 2 2 2   Altered sleeping 1 1 1 1 2   Tired, decreased energy 1 1 2 1 3   Change in appetite 0 1 1 2 1   Feeling bad or failure about yourself  1 1 1 1 1   Trouble concentrating 0 1 1 0 0  Moving slowly or fidgety/restless 0 0 0 0 0  Suicidal thoughts 0 0 0 0 0  PHQ-9 Score 5 7 8 7 9   Difficult doing work/chores - - - - -    ASSESSMENT AND PLAN:  Essential hypertension - Plan: losartan (COZAAR) 50 MG tablet  Insulin resistance - Plan: metFORMIN (GLUCOPHAGE) 500 MG tablet  Other depression  At risk for diabetes mellitus  Class 2 severe obesity with serious comorbidity and body mass index (BMI) of 36.0 to 36.9 in adult, unspecified obesity type (HCC)  PLAN:  Hypertension We discussed sodium restriction, working on healthy weight loss, and a regular exercise program as the means to achieve improved blood pressure control. [name] agreed with this plan and agreed to follow up as directed. We will continue to monitor her blood pressure as well as her progress with the above lifestyle modifications.  [name] agrees to continue taking losartan 50 mg qd #30 and we will refill for 1 month. She will watch for signs of hypotension as she continues her lifestyle modifications. [name] agrees to follow up with our clinic in 3 weeks.  Insulin Resistance Theresa Mathews will continue to work on weight loss, exercise, and decreasing simple carbohydrates in her diet to help decrease the risk of diabetes. We dicussed metformin including benefits and risks. She was informed that eating too many simple carbohydrates or too many calories at one sitting increases  the likelihood of GI side effects. Navy agrees to continue taking metformin 500 q AM #30 and we will refill for 1 month. [name] agrees to follow up with our clinic in 3 weeks as directed to monitor her progress.  Diabetes risk counseling Theresa Mathews was given extended (15 minutes) diabetes prevention counseling today. She is 53 y.o. female and has risk factors for diabetes including obesity and insulin resistance. We discussed intensive lifestyle modifications today with an emphasis on weight loss as well as increasing exercise and decreasing simple carbohydrates in her diet.  Depression with Emotional Eating Behaviors We discussed behavior modification techniques today to help Theresa Mathews deal with her emotional eating and depression. [name] agrees to continue bupropion and start taking Topamax 50 mg qhs #30 with no refills. I encouraged [name] to consider starting counseling. She had been referred previously by Dr. Mallie Mussel to an outside therapist. Theresa Mathews agrees to follow up with our clinic in 3 weeks.  Obesity Theresa Mathews is currently in the action stage of change. As such, her goal is to continue with weight loss efforts She has agreed to follow the Category 2 plan Theresa Mathews will continue current exercise regimen for weight loss and overall health benefits. We discussed the following Behavioral Modification Strategies today: emotional eating strategies and planning for success   Theresa Mathews has agreed to follow up with our clinic in 3 weeks. She was informed of the importance of frequent follow up visits to maximize her success with intensive lifestyle modifications for her multiple health conditions.  ALLERGIES: No Known Allergies  MEDICATIONS: Current Outpatient Medications on File Prior to Visit  Medication Sig Dispense Refill  . buPROPion (WELLBUTRIN SR) 200 MG 12 hr tablet Take 1 tablet (200 mg total) by mouth 2 (two) times daily. 60 tablet 0  . citalopram (CELEXA) 10 MG tablet Take 1 tablet by mouth  daily.    . ranitidine (ZANTAC) 75 MG tablet Take 75 mg by mouth daily at 6 (six) AM.     . Vitamin D, Ergocalciferol, (DRISDOL) 1.25 MG (50000 UT) CAPS capsule Take 1 capsule (50,000 Units total) by mouth every 7 (seven) days. 4 capsule 0   Current Facility-Administered Medications on File Prior to Visit  Medication Dose Route Frequency Provider Last Rate Last Dose  . 0.9 %  sodium chloride infusion  500 mL Intravenous Once [name], MD        PAST MEDICAL HISTORY: Past Medical History:  Diagnosis Date  . Anxiety   . Back pain   . Depression   . Fatigue   . GERD (gastroesophageal reflux disease)   . Lactose intolerance     PAST SURGICAL HISTORY: Past Surgical History:  Procedure Laterality Date  . BLADDER SUSPENSION N/A 01/23/2015   Procedure: TRANSVAGINAL TAPE (TVT) PROCEDURE;  Surgeon: Bobbye Charleston, MD;  Location: [facility];  Service: Gynecology;  Laterality: N/A;  . CYSTOSCOPY N/A 01/23/2015   Procedure: CYSTOSCOPY;  Surgeon: Bobbye Charleston, MD;  Location: [facility];  Service: Gynecology;  Laterality: N/A;  . DILATION AND CURETTAGE OF UTERUS  2005  . ROBOTIC ASSISTED TOTAL HYSTERECTOMY WITH BILATERAL SALPINGO OOPHERECTOMY Bilateral 01/23/2015   Procedure: ROBOTIC ASSISTED TOTAL HYSTERECTOMY WITH BILATERAL SALPINGO OOPHORECTOMY;  Surgeon: Bobbye Charleston, MD;  Location: [facility];  Service: Gynecology;  Laterality: Bilateral;    SOCIAL HISTORY: Social History   Tobacco Use  . Smoking status: Former Smoker    Packs/day: 0.50    Years: 4.00    Pack years: 2.00  . Smokeless tobacco: Never Used  . Tobacco comment: smoked in college  Substance Use Topics  . Alcohol use: Yes    Alcohol/week: 5.0 standard drinks    Types: 5 Glasses of wine per week  . Drug use: No    FAMILY HISTORY: Family History  Problem Relation Age of Onset  . Hypertension Mother   . Ovarian cancer Mother   . Heart disease Mother 47       a fib  . Obesity Mother   . Hypertension Father   .  Diabetes Father   . Hyperlipidemia Father   . Obesity Father     ROS: Review of Systems  Constitutional: Negative for weight loss.  Respiratory: Negative for shortness of breath.   Cardiovascular: Negative for chest pain.  Genitourinary: Negative for frequency.  Endo/Heme/Allergies: Negative for polydipsia.       Negative polyphagia  Psychiatric/Behavioral: Positive for depression. Negative for suicidal ideas.    PHYSICAL EXAM: Blood pressure 133/74, pulse 61, temperature 97.8 F (36.6 C), temperature source Oral, height 5\' 4"  (1.626 m), weight 215 lb (97.5 kg), SpO2 100 %. Body mass index is 36.9 kg/m. Physical Exam Vitals signs reviewed.  Constitutional:      Appearance: Normal appearance. She is obese.  Cardiovascular:     Rate and Rhythm: Normal rate.     Pulses: Normal pulses.  Pulmonary:     Effort: Pulmonary effort is normal.     Breath sounds: Normal breath sounds.  Musculoskeletal: Normal range of motion.  Skin:    General: Skin is warm and dry.  Neurological:     Mental Status: She is alert and oriented to person, place, and time.  Psychiatric:        Mood and Affect: Mood normal.        Behavior: Behavior normal.     RECENT LABS AND TESTS: BMET    Component Value Date/Time   NA 141 03/14/2018 0835   K 4.9 03/14/2018 0835   CL 101 03/14/2018 0835   CO2 26 03/14/2018 0835   GLUCOSE 96 03/14/2018 0835   GLUCOSE 97 07/12/2017 1011   BUN 15 03/14/2018 0835   CREATININE 0.90 03/14/2018 0835   CALCIUM 9.2 03/14/2018 0835   GFRNONAA 74 03/14/2018 0835   GFRAA 86 03/14/2018 0835   Lab Results  Component Value Date   HGBA1C 5.4 03/14/2018   HGBA1C 5.4 11/24/2017   HGBA1C 5.3 08/26/2017   Lab Results  Component Value Date   INSULIN 9.5 03/14/2018   INSULIN 11.2 11/24/2017   INSULIN 9.4 08/26/2017   CBC    Component Value Date/Time   WBC 8.5 07/12/2017 1011   RBC 4.47 07/12/2017 1011   HGB 13.7 07/12/2017 1011   HCT 39.8 07/12/2017 1011    PLT 383.0 07/12/2017 1011   MCV 88.9 07/12/2017 1011   MCH 31.0 01/08/2015 0840   MCHC 34.5 07/12/2017 1011   RDW 13.7 07/12/2017 1011   LYMPHSABS 2.1 07/12/2017 1011   MONOABS 0.5  07/12/2017 1011   EOSABS 0.2 07/12/2017 1011   BASOSABS 0.1 07/12/2017 1011   Iron/TIBC/Ferritin/ %Sat No results found for: IRON, TIBC, FERRITIN, IRONPCTSAT Lipid Panel     Component Value Date/Time   CHOL 219 (H) 03/14/2018 0835   TRIG 135 03/14/2018 0835   HDL 60 03/14/2018 0835   CHOLHDL 4 07/12/2017 1011   VLDL 21.2 07/12/2017 1011   LDLCALC 132 (H) 03/14/2018 0835   Hepatic Function Panel     Component Value Date/Time   PROT 7.3 03/14/2018 0835   ALBUMIN 4.5 03/14/2018 0835   AST 17 03/14/2018 0835   ALT 15 03/14/2018 0835   ALKPHOS 102 03/14/2018 0835   BILITOT 0.3 03/14/2018 0835   BILIDIR 0.0 02/05/2011 0815      Component Value Date/Time   TSH 1.290 08/26/2017 0918   TSH 1.27 07/12/2017 1011   TSH 1.27 02/05/2011 0815      OBESITY BEHAVIORAL INTERVENTION VISIT  Today's visit was # 15   Starting weight: 220 lbs Starting date: 08/26/17 Today's weight : 215 lbs  Today's date: 05/31/2018 Total lbs lost to date: 5    05/31/2018  Height 5\' 4"  (1.626 m)  Weight 215 lb (97.5 kg)  BMI (Calculated) 36.89  BLOOD PRESSURE - SYSTOLIC 737  BLOOD PRESSURE - DIASTOLIC 74   Body Fat % 10.6 %  Total Body Water (lbs) 80.4 lbs     ASK: We discussed the diagnosis of obesity with Jannette Fogo today and Ricci agreed to give [name] permission to discuss obesity behavioral modification therapy today.  ASSESS: Naomee has the diagnosis of obesity and her BMI today is 36.89 Maira is in the action stage of change   ADVISE: Nyliah was educated on the multiple health risks of obesity as well as the benefit of weight loss to improve her health. She was advised of the need for long term treatment and the importance of lifestyle modifications to improve her current health and to decrease her  risk of future health problems.  AGREE: Multiple dietary modification options and treatment options were discussed and  Tammye agreed to follow the recommendations documented in the above note.  ARRANGE: Branda was educated on the importance of frequent visits to treat obesity as outlined per CMS and USPSTF guidelines and agreed to schedule her next follow up appointment today.  Wilhemena Durie, am acting as Location manager for [employer], FNP-C.  I have reviewed the above documentation for accuracy and completeness, and I agree with the above.  - Kyerra Vargo, FNP-C.

## 2018-06-01 ENCOUNTER — Encounter (INDEPENDENT_AMBULATORY_CARE_PROVIDER_SITE_OTHER): Payer: Self-pay | Admitting: Family Medicine

## 2018-06-08 ENCOUNTER — Other Ambulatory Visit (INDEPENDENT_AMBULATORY_CARE_PROVIDER_SITE_OTHER): Payer: Self-pay | Admitting: Family Medicine

## 2018-06-08 ENCOUNTER — Other Ambulatory Visit (INDEPENDENT_AMBULATORY_CARE_PROVIDER_SITE_OTHER): Payer: Self-pay

## 2018-06-08 DIAGNOSIS — I10 Essential (primary) hypertension: Secondary | ICD-10-CM

## 2018-06-08 DIAGNOSIS — E559 Vitamin D deficiency, unspecified: Secondary | ICD-10-CM

## 2018-06-08 DIAGNOSIS — F3289 Other specified depressive episodes: Secondary | ICD-10-CM

## 2018-06-08 MED ORDER — LOSARTAN POTASSIUM 50 MG PO TABS: 50.0000 mg | ORAL_TABLET | Freq: Every day | ORAL | 0 refills | Status: DC

## 2018-06-21 ENCOUNTER — Encounter (INDEPENDENT_AMBULATORY_CARE_PROVIDER_SITE_OTHER): Payer: Self-pay

## 2018-06-22 ENCOUNTER — Encounter (INDEPENDENT_AMBULATORY_CARE_PROVIDER_SITE_OTHER): Payer: Self-pay | Admitting: Family Medicine

## 2018-06-22 ENCOUNTER — Other Ambulatory Visit: Payer: Self-pay

## 2018-06-22 ENCOUNTER — Ambulatory Visit (INDEPENDENT_AMBULATORY_CARE_PROVIDER_SITE_OTHER): Payer: BLUE CROSS/BLUE SHIELD | Admitting: Family Medicine

## 2018-06-22 DIAGNOSIS — F418 Other specified anxiety disorders: Secondary | ICD-10-CM

## 2018-06-22 DIAGNOSIS — I10 Essential (primary) hypertension: Secondary | ICD-10-CM | POA: Diagnosis not present

## 2018-06-22 DIAGNOSIS — Z6836 Body mass index (BMI) 36.0-36.9, adult: Secondary | ICD-10-CM

## 2018-06-22 DIAGNOSIS — E559 Vitamin D deficiency, unspecified: Secondary | ICD-10-CM | POA: Diagnosis not present

## 2018-06-22 MED ORDER — TOPIRAMATE 100 MG PO TABS: 100.0000 mg | ORAL_TABLET | Freq: Every day | ORAL | 0 refills | Status: DC

## 2018-06-22 MED ORDER — LOSARTAN POTASSIUM 50 MG PO TABS: 50.0000 mg | ORAL_TABLET | Freq: Every day | ORAL | 0 refills | Status: DC

## 2018-06-22 MED ORDER — BUPROPION HCL ER (SR) 200 MG PO TB12: 200.0000 mg | ORAL_TABLET | Freq: Two times a day (BID) | ORAL | 0 refills | Status: DC

## 2018-06-22 MED ORDER — VITAMIN D (ERGOCALCIFEROL) 1.25 MG (50000 UNIT) PO CAPS: 50000.0000 [IU] | ORAL_CAPSULE | ORAL | 0 refills | Status: DC

## 2018-06-22 NOTE — Progress Notes (Signed)
Office: [phone]  /  Fax: [phone] TeleHealth Visit:  Theresa Mathews has consented to this TeleHealth visit today via telephone call. The patient is located at home, the provider is located at the [employer] [facility] office. The participants in this visit include the listed provider and patient and any and all parties involved.   HPI:   Chief Complaint: OBESITY Visit today was made via phone call, due to no Wi-Fi and Epic being down. Her medication list is available today. Lyrica is here to discuss her progress with her obesity treatment plan. She is on the Category 2 plan and is following her eating plan approximately 70 % of the time. She states she increased walking. Theresa Mathews is still being mindful of food choices. She has had to "scavenge" due to the grocery shortage. She notes increased emotional eating with the increased stress from finances and the COVID-19 isolation (patient not sick). We were unable to weight the patient today for this TeleHealth visit. She has lost 5 lbs since starting treatment with [name].  Depression with emotional eating behaviors Theresa Mathews notes increased stress due to social isolation and the uncertainty of her job and finances. She has increased comfort eating and she doesn't feel that her Topamax is helping. Theresa Mathews struggles with emotional eating and using food for comfort to the extent that it is negatively impacting her health. She often snacks when she is not hungry. Theresa Mathews sometimes feels she is out of control and then feels guilty that she made poor food choices. She has been working on behavior modification techniques to help reduce her emotional eating and has been somewhat successful. She shows no sign of suicidal or homicidal ideations.  Vitamin D deficiency Theresa Mathews has a diagnosis of vitamin D deficiency. Her last vitamin D level was at 21.4 and is not yet at goal. Theresa Mathews is stable on vit D. She denies nausea, vomiting or muscle weakness.   Hypertension Theresa Mathews is a 53 y.Theresa Mathews. female with hypertension.  Theresa Mathews denies chest pain or shortness of breath on exertion. She is working weight loss to help control her blood pressure with the goal of decreasing her risk of heart attack and stroke. Theresa Mathews blood pressure is currently controlled.  Depression screen Theresa Mathews  Decreased Interest 1 1 1 1 1   Down, Depressed, Hopeless 1 1 1 1 1   PHQ - 2 Score 2 2 2 2 2   Altered sleeping 1 1 1 1 2   Tired, decreased energy 1 1 2 1 3   Change in appetite 0 1 1 2 1   Feeling bad or failure about yourself  1 1 1 1 1   Trouble concentrating 0 1 1 0 0  Moving slowly or fidgety/restless 0 0 0 0 0  Suicidal thoughts 0 0 0 0 0  PHQ-9 Score 5 7 8 7 9   Difficult doing work/chores - - - - -    ASSESSMENT AND PLAN:  Vitamin D deficiency - Plan: Vitamin D, Ergocalciferol, (DRISDOL) 1.25 MG (50000 UT) CAPS capsule  Essential hypertension - Plan: losartan (COZAAR) 50 MG tablet  Depression with anxiety - Plan: buPROPion (WELLBUTRIN SR) 200 MG 12 hr tablet  Class 2 severe obesity with serious comorbidity and body mass index (BMI) of 36.0 to 36.9 in adult, unspecified obesity type (HCC)  PLAN:  Depression with Emotional Eating Behaviors We discussed behavior modification techniques today to help Theresa Mathews deal with her emotional eating and depression. She has agreed  to continue Wellbutrin SR 200 mg twice daily and increase Topamax to 100 mg qHS #30 with no refills and continue Celexa. Theresa Mathews agreed to follow up with our clinic in 2 weeks.   Vitamin D Deficiency Theresa Mathews was informed that low vitamin D levels contributes to fatigue and are associated with obesity, breast, and colon cancer. She agrees to continue to take prescription Vit D @50 ,000 IU every week #4 with no refills and will follow up for routine testing of vitamin D, at least 2-3 times per year. She was informed of the risk of  over-replacement of vitamin D and agrees to not increase her dose unless she discusses this with [name] first. Theresa Mathews agrees to follow up as directed.  Hypertension We discussed sodium restriction, working on healthy weight loss, and a regular exercise program as the means to achieve improved blood pressure control. Theresa Mathews agreed with this plan and agreed to follow up as directed. We will continue to monitor her blood pressure as well as her progress with the above lifestyle modifications. Theresa Mathews agrees to continue Losartan 50 mg daily #30 with no refills and she will watch for signs of hypotension as she continues her lifestyle modifications.  Obesity Theresa Mathews is currently in the action stage of change. As such, her goal is to continue with weight loss efforts She has agreed to follow the Category 2 plan Theresa Mathews has been instructed to work up to a goal of 150 minutes of combined cardio and strengthening exercise per week for weight loss and overall health benefits. We discussed the following Behavioral Modification Strategies today: increasing lean protein intake, decreasing simple carbohydrates  and work on meal planning and easy cooking plans  Theresa Mathews has agreed to follow up with our clinic in 2 weeks. She was informed of the importance of frequent follow up visits to maximize her success with intensive lifestyle modifications for her multiple health conditions.  ALLERGIES: No Known Allergies  MEDICATIONS: Current Outpatient Medications on File Prior to Visit  Medication Sig Dispense Refill  . citalopram (CELEXA) 10 MG tablet Take 1 tablet by mouth daily.    . metFORMIN (GLUCOPHAGE) 500 MG tablet Take 1 tablet (500 mg total) by mouth daily with breakfast. 30 tablet 0  . ranitidine (ZANTAC) 75 MG tablet Take 75 mg by mouth daily at 6 (six) AM.      Current Facility-Administered Medications on File Prior to Visit  Medication Dose Route Frequency Provider Last Rate Last Dose  . 0.9 %  sodium  chloride infusion  500 mL Intravenous Once [name], MD        PAST MEDICAL HISTORY: Past Medical History:  Diagnosis Date  . Anxiety   . Back pain   . Depression   . Fatigue   . GERD (gastroesophageal reflux disease)   . Lactose intolerance     PAST SURGICAL HISTORY: Past Surgical History:  Procedure Laterality Date  . BLADDER SUSPENSION N/A 01/23/2015   Procedure: TRANSVAGINAL TAPE (TVT) PROCEDURE;  Surgeon: Bobbye Charleston, MD;  Location: [facility];  Service: Gynecology;  Laterality: N/A;  . CYSTOSCOPY N/A 01/23/2015   Procedure: CYSTOSCOPY;  Surgeon: Bobbye Charleston, MD;  Location: [facility];  Service: Gynecology;  Laterality: N/A;  . DILATION AND CURETTAGE OF UTERUS  2005  . ROBOTIC ASSISTED TOTAL HYSTERECTOMY WITH BILATERAL SALPINGO OOPHERECTOMY Bilateral 01/23/2015   Procedure: ROBOTIC ASSISTED TOTAL HYSTERECTOMY WITH BILATERAL SALPINGO OOPHORECTOMY;  Surgeon: Bobbye Charleston, MD;  Location: [facility];  Service: Gynecology;  Laterality: Bilateral;    SOCIAL  HISTORY: Social History   Tobacco Use  . Smoking status: Former Smoker    Packs/day: 0.50    Years: 4.00    Pack years: 2.00  . Smokeless tobacco: Never Used  . Tobacco comment: smoked in college  Substance Use Topics  . Alcohol use: Yes    Alcohol/week: 5.0 standard drinks    Types: 5 Glasses of wine per week  . Drug use: No    FAMILY HISTORY: Family History  Problem Relation Age of Onset  . Hypertension Mother   . Ovarian cancer Mother   . Heart disease Mother 61       a fib  . Obesity Mother   . Hypertension Father   . Diabetes Father   . Hyperlipidemia Father   . Obesity Father     ROS: Review of Systems  Gastrointestinal: Negative for nausea and vomiting.  Musculoskeletal:       Negative for muscle weakness  Psychiatric/Behavioral: Positive for depression. Negative for suicidal ideas.       + stress    PHYSICAL EXAM: Pt in no acute distress  RECENT LABS AND TESTS: BMET    Component  Value Date/Time   NA 141 03/14/2018 0835   K 4.9 03/14/2018 0835   CL 101 03/14/2018 0835   CO2 26 03/14/2018 0835   GLUCOSE 96 03/14/2018 0835   GLUCOSE 97 07/12/2017 1011   BUN 15 03/14/2018 0835   CREATININE 0.90 03/14/2018 0835   CALCIUM 9.2 03/14/2018 0835   GFRNONAA 74 03/14/2018 0835   GFRAA 86 03/14/2018 0835   Lab Results  Component Value Date   HGBA1C 5.4 03/14/2018   HGBA1C 5.4 11/24/2017   HGBA1C 5.3 08/26/2017   Lab Results  Component Value Date   INSULIN 9.5 03/14/2018   INSULIN 11.2 11/24/2017   INSULIN 9.4 08/26/2017   CBC    Component Value Date/Time   WBC 8.5 07/12/2017 1011   RBC 4.47 07/12/2017 1011   HGB 13.7 07/12/2017 1011   HCT 39.8 07/12/2017 1011   PLT 383.0 07/12/2017 1011   MCV 88.9 07/12/2017 1011   MCH 31.0 01/08/2015 0840   MCHC 34.5 07/12/2017 1011   RDW 13.7 07/12/2017 1011   LYMPHSABS 2.1 07/12/2017 1011   MONOABS 0.5 07/12/2017 1011   EOSABS 0.2 07/12/2017 1011   BASOSABS 0.1 07/12/2017 1011   Iron/TIBC/Ferritin/ %Sat No results found for: IRON, TIBC, FERRITIN, IRONPCTSAT Lipid Panel     Component Value Date/Time   CHOL 219 (H) 03/14/2018 0835   TRIG 135 03/14/2018 0835   HDL 60 03/14/2018 0835   CHOLHDL 4 07/12/2017 1011   VLDL 21.2 07/12/2017 1011   LDLCALC 132 (H) 03/14/2018 0835   Hepatic Function Panel     Component Value Date/Time   PROT 7.3 03/14/2018 0835   ALBUMIN 4.5 03/14/2018 0835   AST 17 03/14/2018 0835   ALT 15 03/14/2018 0835   ALKPHOS 102 03/14/2018 0835   BILITOT 0.3 03/14/2018 0835   BILIDIR 0.0 02/05/2011 0815      Component Value Date/Time   TSH 1.290 08/26/2017 0918   TSH 1.27 07/12/2017 1011   TSH 1.27 02/05/2011 0815   Results for SEARRA, CARNATHAN (MRN [id number]) as of 06/22/2018 16:16  Ref. Range 03/14/2018 08:35  Vitamin D, 25-Hydroxy Latest Ref Range: 30.0 - 100.0 ng/mL 21.4 (L)     I, Doreene Nest, am acting as Location manager for Dennard Nip, MD I have reviewed the above  documentation for accuracy and completeness, and I agree  with the above. -Dennard Nip, MD

## 2018-06-23 ENCOUNTER — Encounter (INDEPENDENT_AMBULATORY_CARE_PROVIDER_SITE_OTHER): Payer: Self-pay

## 2018-07-11 ENCOUNTER — Encounter (INDEPENDENT_AMBULATORY_CARE_PROVIDER_SITE_OTHER): Payer: Self-pay | Admitting: Family Medicine

## 2018-07-11 ENCOUNTER — Other Ambulatory Visit: Payer: Self-pay

## 2018-07-11 ENCOUNTER — Ambulatory Visit (INDEPENDENT_AMBULATORY_CARE_PROVIDER_SITE_OTHER): Payer: BLUE CROSS/BLUE SHIELD | Admitting: Family Medicine

## 2018-07-11 DIAGNOSIS — E559 Vitamin D deficiency, unspecified: Secondary | ICD-10-CM | POA: Diagnosis not present

## 2018-07-11 DIAGNOSIS — I10 Essential (primary) hypertension: Secondary | ICD-10-CM

## 2018-07-11 DIAGNOSIS — Z6836 Body mass index (BMI) 36.0-36.9, adult: Secondary | ICD-10-CM

## 2018-07-11 MED ORDER — VITAMIN D (ERGOCALCIFEROL) 1.25 MG (50000 UNIT) PO CAPS: 50000.0000 [IU] | ORAL_CAPSULE | ORAL | 0 refills | Status: DC

## 2018-07-11 MED ORDER — LOSARTAN POTASSIUM 100 MG PO TABS: 100.0000 mg | ORAL_TABLET | Freq: Every day | ORAL | 0 refills | Status: DC

## 2018-07-11 NOTE — Progress Notes (Signed)
Office: [phone]  /  Fax: [phone] TeleHealth Visit:  Theresa Mathews has verbally consented to this TeleHealth visit today. The patient is located at work, the provider is located at the [employer] [facility] office. The participants in this visit include the listed provider and patient. The visit was conducted today via Face Time.  HPI:   Chief Complaint: OBESITY Theresa Mathews is here to discuss her progress with her obesity treatment plan. She is on the Category 2 plan and is following her eating plan approximately 50 % of the time. She states she is walking 30 minutes 4 to 5 times per week. Theresa Mathews feels that she is still struggling with eating and snacking while watching tv. Her motivation to lose weight has decreased while isolating for COVID 19 and she feels that she has gained approximately 3 pounds. She is frustrated with this and is ready to get back on track.  We were unable to weigh the patient today for this TeleHealth visit. She feels as if she has gained weight since her last visit. She has lost 5 lbs since starting treatment with [name].  Vitamin D Deficiency Theresa Mathews has a diagnosis of vitamin D deficiency. She is currently stable on vit D, but is not yet at goal. Theresa Mathews denies nausea, vomiting, or muscle weakness.  Hypertension Theresa Mathews is a 53 y.o. female with hypertension. Theresa Mathews checked her blood pressure at home and it is running in the 160's/70's. She is working on weight loss to help control her blood pressure with the goal of decreasing her risk of heart attack and stroke. Theresa Mathews denies chest pain or headaches.  ASSESSMENT AND PLAN:  Vitamin D deficiency - Plan: Vitamin D, Ergocalciferol, (DRISDOL) 1.25 MG (50000 UT) CAPS capsule  Essential hypertension - Plan: losartan (COZAAR) 100 MG tablet  Class 2 severe obesity with serious comorbidity and body mass index (BMI) of 36.0 to 36.9 in adult, unspecified obesity type ([name])  PLAN:  Vitamin D Deficiency  Theresa Mathews was informed that low vitamin D levels contribute to fatigue and are associated with obesity, breast, and colon cancer. Theresa Mathews agrees to continue to take prescription Vit D @50 ,000 IU every week #4 with no refills and will follow up for routine testing of vitamin D, at least 2-3 times per year. She was informed of the risk of over-replacement of vitamin D and agrees to not increase her dose unless she discusses this with [name] first. Theresa Mathews agrees to follow up in 2 to 3 weeks as directed.  Hypertension We discussed sodium restriction, working on healthy weight loss, and a regular exercise program as the means to achieve improved blood pressure control. We will continue to monitor her blood pressure as well as her progress with the above lifestyle modifications. She will continue her diet, weight loss, and increase her losartan to 100 mg qd #30 with no refills and will watch for signs of hypotension as she continues her lifestyle modifications. Theresa Mathews agreed with this plan and agreed to follow up as directed.  Obesity Theresa Mathews is currently in the action stage of change. As such, her goal is to continue with weight loss efforts. She has agreed to follow the Category 2 plan. Theresa Mathews has been instructed to work up to a goal of 150 minutes of combined cardio and strengthening exercise per week for weight loss and overall health benefits. We discussed the following Behavioral Modification Strategies today: increasing lean protein intake, emotional eating strategies, better snacking choices, no skipping meals, and ways  to avoid boredom eating.  Theresa Mathews has agreed to follow up with our clinic in 2 to 3 weeks. She was informed of the importance of frequent follow up visits to maximize her success with intensive lifestyle modifications for her multiple health conditions.  ALLERGIES: No Known Allergies  MEDICATIONS: Current Outpatient Medications on File Prior to Visit  Medication Sig Dispense Refill  .  buPROPion (WELLBUTRIN SR) 200 MG 12 hr tablet Take 1 tablet (200 mg total) by mouth 2 (two) times daily. 60 tablet 0  . citalopram (CELEXA) 10 MG tablet Take 1 tablet by mouth daily.    . metFORMIN (GLUCOPHAGE) 500 MG tablet Take 1 tablet (500 mg total) by mouth daily with breakfast. 30 tablet 0  . ranitidine (ZANTAC) 75 MG tablet Take 75 mg by mouth daily at 6 (six) AM.     . topiramate (TOPAMAX) 100 MG tablet Take 1 tablet (100 mg total) by mouth at bedtime. 30 tablet 0  . Vitamin D, Ergocalciferol, (DRISDOL) 1.25 MG (50000 UT) CAPS capsule Take 1 capsule (50,000 Units total) by mouth every 7 (seven) days. 4 capsule 0   Current Facility-Administered Medications on File Prior to Visit  Medication Dose Route Frequency Provider Last Rate Last Dose  . 0.9 %  sodium chloride infusion  500 mL Intravenous Once [name], MD        PAST MEDICAL HISTORY: Past Medical History:  Diagnosis Date  . Anxiety   . Back pain   . Depression   . Fatigue   . GERD (gastroesophageal reflux disease)   . Lactose intolerance     PAST SURGICAL HISTORY: Past Surgical History:  Procedure Laterality Date  . BLADDER SUSPENSION N/A 01/23/2015   Procedure: TRANSVAGINAL TAPE (TVT) PROCEDURE;  Surgeon: Bobbye Charleston, MD;  Location: [facility];  Service: Gynecology;  Laterality: N/A;  . CYSTOSCOPY N/A 01/23/2015   Procedure: CYSTOSCOPY;  Surgeon: Bobbye Charleston, MD;  Location: [facility];  Service: Gynecology;  Laterality: N/A;  . DILATION AND CURETTAGE OF UTERUS  2005  . ROBOTIC ASSISTED TOTAL HYSTERECTOMY WITH BILATERAL SALPINGO OOPHERECTOMY Bilateral 01/23/2015   Procedure: ROBOTIC ASSISTED TOTAL HYSTERECTOMY WITH BILATERAL SALPINGO OOPHORECTOMY;  Surgeon: Bobbye Charleston, MD;  Location: [facility];  Service: Gynecology;  Laterality: Bilateral;    SOCIAL HISTORY: Social History   Tobacco Use  . Smoking status: Former Smoker    Packs/day: 0.50    Years: 4.00    Pack years: 2.00  . Smokeless tobacco: Never  Used  . Tobacco comment: smoked in college  Substance Use Topics  . Alcohol use: Yes    Alcohol/week: 5.0 standard drinks    Types: 5 Glasses of wine per week  . Drug use: No    FAMILY HISTORY: Family History  Problem Relation Age of Onset  . Hypertension Mother   . Ovarian cancer Mother   . Heart disease Mother 38       a fib  . Obesity Mother   . Hypertension Father   . Diabetes Father   . Hyperlipidemia Father   . Obesity Father     ROS: Review of Systems  Cardiovascular: Negative for chest pain.  Gastrointestinal: Negative for nausea and vomiting.  Musculoskeletal:       Negative for muscle weakness.  Neurological: Negative for headaches.    PHYSICAL EXAM: Pt in no acute distress  RECENT LABS AND TESTS: BMET    Component Value Date/Time   NA 141 03/14/2018 0835   K 4.9 03/14/2018 0835   CL  101 03/14/2018 0835   CO2 26 03/14/2018 0835   GLUCOSE 96 03/14/2018 0835   GLUCOSE 97 07/12/2017 1011   BUN 15 03/14/2018 0835   CREATININE 0.90 03/14/2018 0835   CALCIUM 9.2 03/14/2018 0835   GFRNONAA 74 03/14/2018 0835   GFRAA 86 03/14/2018 0835   Lab Results  Component Value Date   HGBA1C 5.4 03/14/2018   HGBA1C 5.4 11/24/2017   HGBA1C 5.3 08/26/2017   Lab Results  Component Value Date   INSULIN 9.5 03/14/2018   INSULIN 11.2 11/24/2017   INSULIN 9.4 08/26/2017   CBC    Component Value Date/Time   WBC 8.5 07/12/2017 1011   RBC 4.47 07/12/2017 1011   HGB 13.7 07/12/2017 1011   HCT 39.8 07/12/2017 1011   PLT 383.0 07/12/2017 1011   MCV 88.9 07/12/2017 1011   MCH 31.0 01/08/2015 0840   MCHC 34.5 07/12/2017 1011   RDW 13.7 07/12/2017 1011   LYMPHSABS 2.1 07/12/2017 1011   MONOABS 0.5 07/12/2017 1011   EOSABS 0.2 07/12/2017 1011   BASOSABS 0.1 07/12/2017 1011   Iron/TIBC/Ferritin/ %Sat No results found for: IRON, TIBC, FERRITIN, IRONPCTSAT Lipid Panel     Component Value Date/Time   CHOL 219 (H) 03/14/2018 0835   TRIG 135 03/14/2018 0835   HDL  60 03/14/2018 0835   CHOLHDL 4 07/12/2017 1011   VLDL 21.2 07/12/2017 1011   LDLCALC 132 (H) 03/14/2018 0835   Hepatic Function Panel     Component Value Date/Time   PROT 7.3 03/14/2018 0835   ALBUMIN 4.5 03/14/2018 0835   AST 17 03/14/2018 0835   ALT 15 03/14/2018 0835   ALKPHOS 102 03/14/2018 0835   BILITOT 0.3 03/14/2018 0835   BILIDIR 0.0 02/05/2011 0815      Component Value Date/Time   TSH 1.290 08/26/2017 0918   TSH 1.27 07/12/2017 1011   TSH 1.27 02/05/2011 0815    Results for KIMYATTA, LECY (MRN [id number]) as of 07/11/2018 15:38  Ref. Range 03/14/2018 08:35  Vitamin D, 25-Hydroxy Latest Ref Range: 30.0 - 100.0 ng/mL 21.4 (L)    I, Marcille Blanco, CMA, am acting as transcriptionist for Starlyn Skeans, MD I have reviewed the above documentation for accuracy and completeness, and I agree with the above. -Dennard Nip, MD

## 2018-07-30 ENCOUNTER — Other Ambulatory Visit (INDEPENDENT_AMBULATORY_CARE_PROVIDER_SITE_OTHER): Payer: Self-pay | Admitting: Family Medicine

## 2018-07-30 DIAGNOSIS — E8881 Metabolic syndrome: Secondary | ICD-10-CM

## 2018-08-01 ENCOUNTER — Ambulatory Visit (INDEPENDENT_AMBULATORY_CARE_PROVIDER_SITE_OTHER): Payer: BLUE CROSS/BLUE SHIELD | Admitting: Family Medicine

## 2018-08-01 ENCOUNTER — Encounter (INDEPENDENT_AMBULATORY_CARE_PROVIDER_SITE_OTHER): Payer: Self-pay | Admitting: Family Medicine

## 2018-08-01 ENCOUNTER — Other Ambulatory Visit: Payer: Self-pay

## 2018-08-01 DIAGNOSIS — F418 Other specified anxiety disorders: Secondary | ICD-10-CM | POA: Diagnosis not present

## 2018-08-01 DIAGNOSIS — I10 Essential (primary) hypertension: Secondary | ICD-10-CM

## 2018-08-01 DIAGNOSIS — Z6836 Body mass index (BMI) 36.0-36.9, adult: Secondary | ICD-10-CM

## 2018-08-01 DIAGNOSIS — E559 Vitamin D deficiency, unspecified: Secondary | ICD-10-CM

## 2018-08-01 DIAGNOSIS — E8881 Metabolic syndrome: Secondary | ICD-10-CM

## 2018-08-01 MED ORDER — VITAMIN D (ERGOCALCIFEROL) 1.25 MG (50000 UNIT) PO CAPS: 50000.0000 [IU] | ORAL_CAPSULE | ORAL | 0 refills | Status: DC

## 2018-08-01 MED ORDER — BUPROPION HCL ER (SR) 200 MG PO TB12: 200.0000 mg | ORAL_TABLET | Freq: Two times a day (BID) | ORAL | 0 refills | Status: DC

## 2018-08-01 MED ORDER — LOSARTAN POTASSIUM 100 MG PO TABS: 100.0000 mg | ORAL_TABLET | Freq: Every day | ORAL | 0 refills | Status: DC

## 2018-08-01 MED ORDER — METFORMIN HCL 500 MG PO TABS: 500.0000 mg | ORAL_TABLET | Freq: Every day | ORAL | 0 refills | Status: DC

## 2018-08-01 NOTE — Progress Notes (Signed)
Office: [phone]  /  Fax: [phone] TeleHealth Visit:  Theresa Mathews has verbally consented to this TeleHealth visit today. The patient is located at work, the provider is located at the [employer] [facility] office. The participants in this visit include the listed provider and patient. The visit was conducted today via Face Time.  HPI:   Chief Complaint: OBESITY Theresa Mathews is here to discuss her progress with her obesity treatment plan. She is on the Category 2 plan and is following her eating plan approximately 50 % of the time. She states she is walking 20 to 30 minutes 3 times per week. Theresa Mathews feels that she has gained 2 to 3 pounds. She is doing more stress eating and snacking especially in the afternoon. She is frustrated that she is struggling, but states that she is ready to get back on track.   We were unable to weigh the patient today for this TeleHealth visit. She feels as if she has gained weight since her last visit. She has lost 5 lbs since starting treatment with [name].  Depression with emotional eating behaviors Theresa Mathews has been stress eating more in the last 3 weeks during the COVID19 isolation. This is worse in the afternoon. Theresa Mathews is missing her topiramate dose at least 50% of the time as she takes no other medications at bedtime, but takes them at dinner time.She is struggling with emotional eating and using food for comfort to the extent that it is negatively impacting her health. She often snacks when she is not hungry. Azul sometimes feels she is out of control and then feels guilty that she made poor food choices. She has been working on behavior modification techniques to help reduce her emotional eating and has been somewhat successful.  Insulin Resistance Theresa Mathews has a diagnosis of insulin resistance based on her elevated fasting insulin level >5. Although Theresa Mathews's blood glucose readings are still under good control, insulin resistance puts her at greater risk  of metabolic syndrome and diabetes. She is stable on metformin and her hunger is controlled, but she has increased simple carbs in her diet. [name] continues to work on diet and exercise to decrease risk of diabetes. She denies nausea, vomiting, and hypoglycemia.  Vitamin D Deficiency Theresa Mathews has a diagnosis of vitamin D deficiency. She is currently stable on vit D, but is not yet at goal. Theresa Mathews denies nausea, vomiting, or muscle weakness.  Hypertension Theresa Mathews is a 53 y.o. female with hypertension. Theresa Mathews states that her blood pressure runs 130's/70's and she in on medications. She is working on diet, exercise, and weight loss to help control her blood pressure with the goal of decreasing her risk of heart attack and stroke. Theresa Mathews denies chest pain, headache, or dizziness.  ASSESSMENT AND PLAN:  Insulin resistance - Plan: metFORMIN (GLUCOPHAGE) 500 MG tablet  Vitamin D deficiency - Plan: Vitamin D, Ergocalciferol, (DRISDOL) 1.25 MG (50000 UT) CAPS capsule  Essential hypertension - Plan: losartan (COZAAR) 100 MG tablet  Depression with anxiety - Plan: buPROPion (WELLBUTRIN SR) 200 MG 12 hr tablet  Class 2 severe obesity with serious comorbidity and body mass index (BMI) of 36.0 to 36.9 in adult, unspecified obesity type ([place])  PLAN:  Insulin Resistance Theresa Mathews will continue to work on weight loss, exercise, and decreasing simple carbohydrates in her diet to help decrease the risk of diabetes. She was informed that eating too many simple carbohydrates or too many calories at one sitting increases the likelihood of GI side  effects. [name] agreed to continue metformin 500 mg qAM #30 with no refills and prescription was written today. [name] agreed to get back to her diet and to follow up with [name] as directed to monitor her progress in 2 weeks.  Vitamin D Deficiency Theresa Mathews was informed that low vitamin D levels contribute to fatigue and are associated with obesity, breast, and colon  cancer. [name] agrees to continue to take prescription Vit D @50 ,000 IU every week #4 with no refills and will follow up for routine testing of vitamin D, at least 2-3 times per year. She was informed of the risk of over-replacement of vitamin D and agrees to not increase her dose unless she discusses this with [name] first. [name] agrees to follow up in 2 weeks as directed.  Hypertension We discussed sodium restriction, working on healthy weight loss, and a regular exercise program as the means to achieve improved blood pressure control. We will continue to monitor her blood pressure as well as her progress with the above lifestyle modifications. She will continue her losartan 100 mg qd #30 with no refills and will watch for signs of hypotension as she continues her lifestyle modifications. Theresa Mathews agreed with this plan and agreed to follow up as directed.  Depression with Emotional Eating Behaviors We discussed behavior modification techniques today to help Theresa Mathews deal with her emotional eating and depression. She has agreed to take Wellbutrin SR 200 mg BID #60 and change Topamax to take at dinnertime. Theresa Mathews agreed to follow up as directed.  Obesity Theresa Mathews is currently in the action stage of change. As such, her goal is to continue with weight loss efforts. She has agreed to follow the Category 2 plan. Theresa Mathews has been instructed to work up to a goal of 150 minutes of combined cardio and strengthening exercise per week for weight loss and overall health benefits. We discussed the following Behavioral Modification Strategies today: work on meal planning and easy cooking plans, ways to avoid boredom eating, keeping healthy foods in the home, better snacking choices, and ways to avoid night time snacking.  Theresa Mathews has agreed to follow up with our clinic in 2 weeks. She was informed of the importance of frequent follow up visits to maximize her success with intensive lifestyle modifications for her multiple  health conditions.  ALLERGIES: No Known Allergies  MEDICATIONS: Current Outpatient Medications on File Prior to Visit  Medication Sig Dispense Refill  . citalopram (CELEXA) 10 MG tablet Take 1 tablet by mouth daily.    . ranitidine (ZANTAC) 75 MG tablet Take 75 mg by mouth daily at 6 (six) AM.     . topiramate (TOPAMAX) 100 MG tablet Take 1 tablet (100 mg total) by mouth at bedtime. 30 tablet 0   Current Facility-Administered Medications on File Prior to Visit  Medication Dose Route Frequency Provider Last Rate Last Dose  . 0.9 %  sodium chloride infusion  500 mL Intravenous Once [name], MD        PAST MEDICAL HISTORY: Past Medical History:  Diagnosis Date  . Anxiety   . Back pain   . Depression   . Fatigue   . GERD (gastroesophageal reflux disease)   . Lactose intolerance     PAST SURGICAL HISTORY: Past Surgical History:  Procedure Laterality Date  . BLADDER SUSPENSION N/A 01/23/2015   Procedure: TRANSVAGINAL TAPE (TVT) PROCEDURE;  Surgeon: Bobbye Charleston, MD;  Location: [facility];  Service: Gynecology;  Laterality: N/A;  . CYSTOSCOPY N/A 01/23/2015   Procedure:  CYSTOSCOPY;  Surgeon: Bobbye Charleston, MD;  Location: [facility];  Service: Gynecology;  Laterality: N/A;  . DILATION AND CURETTAGE OF UTERUS  2005  . ROBOTIC ASSISTED TOTAL HYSTERECTOMY WITH BILATERAL SALPINGO OOPHERECTOMY Bilateral 01/23/2015   Procedure: ROBOTIC ASSISTED TOTAL HYSTERECTOMY WITH BILATERAL SALPINGO OOPHORECTOMY;  Surgeon: Bobbye Charleston, MD;  Location: [facility];  Service: Gynecology;  Laterality: Bilateral;    SOCIAL HISTORY: Social History   Tobacco Use  . Smoking status: Former Smoker    Packs/day: 0.50    Years: 4.00    Pack years: 2.00  . Smokeless tobacco: Never Used  . Tobacco comment: smoked in college  Substance Use Topics  . Alcohol use: Yes    Alcohol/week: 5.0 standard drinks    Types: 5 Glasses of wine per week  . Drug use: No    FAMILY HISTORY: Family History   Problem Relation Age of Onset  . Hypertension Mother   . Ovarian cancer Mother   . Heart disease Mother 36       a fib  . Obesity Mother   . Hypertension Father   . Diabetes Father   . Hyperlipidemia Father   . Obesity Father     ROS: Review of Systems  Cardiovascular: Negative for chest pain.  Gastrointestinal: Negative for nausea and vomiting.  Musculoskeletal:       Negative for muscle weakness.  Neurological: Negative for dizziness and headaches.  Endo/Heme/Allergies:       Negative for hypoglycemia.  Psychiatric/Behavioral: Positive for depression.    PHYSICAL EXAM: Pt in no acute distress  RECENT LABS AND TESTS: BMET    Component Value Date/Time   NA 141 03/14/2018 0835   K 4.9 03/14/2018 0835   CL 101 03/14/2018 0835   CO2 26 03/14/2018 0835   GLUCOSE 96 03/14/2018 0835   GLUCOSE 97 07/12/2017 1011   BUN 15 03/14/2018 0835   CREATININE 0.90 03/14/2018 0835   CALCIUM 9.2 03/14/2018 0835   GFRNONAA 74 03/14/2018 0835   GFRAA 86 03/14/2018 0835   Lab Results  Component Value Date   HGBA1C 5.4 03/14/2018   HGBA1C 5.4 11/24/2017   HGBA1C 5.3 08/26/2017   Lab Results  Component Value Date   INSULIN 9.5 03/14/2018   INSULIN 11.2 11/24/2017   INSULIN 9.4 08/26/2017   CBC    Component Value Date/Time   WBC 8.5 07/12/2017 1011   RBC 4.47 07/12/2017 1011   HGB 13.7 07/12/2017 1011   HCT 39.8 07/12/2017 1011   PLT 383.0 07/12/2017 1011   MCV 88.9 07/12/2017 1011   MCH 31.0 01/08/2015 0840   MCHC 34.5 07/12/2017 1011   RDW 13.7 07/12/2017 1011   LYMPHSABS 2.1 07/12/2017 1011   MONOABS 0.5 07/12/2017 1011   EOSABS 0.2 07/12/2017 1011   BASOSABS 0.1 07/12/2017 1011   Iron/TIBC/Ferritin/ %Sat No results found for: IRON, TIBC, FERRITIN, IRONPCTSAT Lipid Panel     Component Value Date/Time   CHOL 219 (H) 03/14/2018 0835   TRIG 135 03/14/2018 0835   HDL 60 03/14/2018 0835   CHOLHDL 4 07/12/2017 1011   VLDL 21.2 07/12/2017 1011   LDLCALC 132 (H)  03/14/2018 0835   Hepatic Function Panel     Component Value Date/Time   PROT 7.3 03/14/2018 0835   ALBUMIN 4.5 03/14/2018 0835   AST 17 03/14/2018 0835   ALT 15 03/14/2018 0835   ALKPHOS 102 03/14/2018 0835   BILITOT 0.3 03/14/2018 0835   BILIDIR 0.0 02/05/2011 0815      Component  Value Date/Time   TSH 1.290 08/26/2017 0918   TSH 1.27 07/12/2017 1011   TSH 1.27 02/05/2011 0815   Results for KORTNI, HASTEN (MRN [id number]) as of 08/01/2018 15:47  Ref. Range 03/14/2018 08:35  Vitamin D, 25-Hydroxy Latest Ref Range: 30.0 - 100.0 ng/mL 21.4 (L)    I, Marcille Blanco, CMA, am acting as transcriptionist for Starlyn Skeans, MD I have reviewed the above documentation for accuracy and completeness, and I agree with the above. -Dennard Nip, MD

## 2018-08-15 ENCOUNTER — Other Ambulatory Visit (INDEPENDENT_AMBULATORY_CARE_PROVIDER_SITE_OTHER): Payer: Self-pay | Admitting: Family Medicine

## 2018-08-15 DIAGNOSIS — I10 Essential (primary) hypertension: Secondary | ICD-10-CM

## 2018-08-16 ENCOUNTER — Ambulatory Visit (INDEPENDENT_AMBULATORY_CARE_PROVIDER_SITE_OTHER): Payer: BLUE CROSS/BLUE SHIELD | Admitting: Family Medicine

## 2018-08-16 ENCOUNTER — Ambulatory Visit: Payer: Self-pay

## 2018-08-16 ENCOUNTER — Other Ambulatory Visit: Payer: Self-pay

## 2018-08-16 ENCOUNTER — Encounter: Payer: Self-pay | Admitting: Family Medicine

## 2018-08-16 ENCOUNTER — Ambulatory Visit (INDEPENDENT_AMBULATORY_CARE_PROVIDER_SITE_OTHER): Payer: Self-pay | Admitting: Family Medicine

## 2018-08-16 DIAGNOSIS — R197 Diarrhea, unspecified: Secondary | ICD-10-CM

## 2018-08-16 DIAGNOSIS — Z20828 Contact with and (suspected) exposure to other viral communicable diseases: Secondary | ICD-10-CM | POA: Insufficient documentation

## 2018-08-16 DIAGNOSIS — Z20822 Contact with and (suspected) exposure to covid-19: Secondary | ICD-10-CM | POA: Insufficient documentation

## 2018-08-16 DIAGNOSIS — R509 Fever, unspecified: Secondary | ICD-10-CM | POA: Diagnosis not present

## 2018-08-16 NOTE — Telephone Encounter (Signed)
Virtual visit scheduled.  

## 2018-08-16 NOTE — Progress Notes (Signed)
Virtual Visit via Video Note  I connected with Theresa Mathews on 08/16/18 at  3:00 PM EDT by a video enabled telemedicine application and verified that I am speaking with the correct person using two identifiers.  Location: Patient: home  Provider: office    I discussed the limitations of evaluation and management by telemedicine and the availability of in person appointments. The patient expressed understanding and agreed to proceed.  History of Present Illness: Pt is home c/o fever 100.8 and diarrhea the last 2 days --- it has resolved but she still has body aches and fever.  Her daughter is a Marine scientist in the hospital.  No known direct contact with covid but pt is concerned .   No other symptoms    Past Medical History:  Diagnosis Date  . Anxiety   . Back pain   . Depression   . Fatigue   . GERD (gastroesophageal reflux disease)   . Lactose intolerance    Observations/Objective: t 100.8   No other vitals obtained Pt in NAD    Assessment and Plan: 1. Exposure to Covid-19 Virus Pt given number to call [facility] covid test site  If symptoms worsen--- go to ER   2. Diarrhea, unspecified type Diarrhea resolved    3. Fever and chills And body aches  ? covid Tylenol Fluids Rest  Quarantine until symptoms resolved and fever free x 3 days  Pt going to call [facility] testing site    Follow Up Instructions:    I discussed the assessment and treatment plan with the patient. The patient was provided an opportunity to ask questions and all were answered. The patient agreed with the plan and demonstrated an understanding of the instructions.   The patient was advised to call back or seek an in-person evaluation if the symptoms worsen or if the condition fails to improve as anticipated.  I provided 15 minutes of non-face-to-face time during this encounter.   Ann Held, DO

## 2018-08-16 NOTE — Telephone Encounter (Signed)
Pt had to r/s today due to PCP appt

## 2018-08-16 NOTE — Telephone Encounter (Signed)
Ok to refill x 1 month

## 2018-08-16 NOTE — Telephone Encounter (Signed)
Patient called and says she's been having diarrhea and a fever up to 100.8 since yesterday. She says she hasn't been exposed or traveled. She says her daughter, who is a Dietitian, came to her home and stayed the weekend of Mother's Day. She says that would be the only slim chance of exposure, if she's been exposed. She denies cough, no SOB, no other symptoms. I called the office and spoke to Rod Holler, [facility] who asked to speak to the patient, the call was connected successfully.   Answer Assessment - Initial Assessment Questions 1. CLOSE CONTACT: "Who is the person with the confirmed or suspected COVID-19 infection that you were exposed to?"     Possibly daughter who is a Dietitian 2. PLACE of CONTACT: "Where were you when you were exposed to COVID-19?" (e.g., home, school, medical waiting room; which city?)     Home 3. TYPE of CONTACT: "How much contact was there?" (e.g., sitting next to, live in same house, work in same office, same building)     She visited in my house 4. DURATION of CONTACT: "How long were you in contact with the COVID-19 patient?" (e.g., a few seconds, passed by person, a few minutes, live with the patient)     The whole weekend 5. DATE of CONTACT: "When did you have contact with a COVID-19 patient?" (e.g., how many days ago)     Mother's Day weekend 6. TRAVEL: "Have you traveled out of the country recently?" If so, "When and where?"     * Also ask about out-of-state travel, since the CDC has identified some high risk cities for community spread in the [name].     * Note: Travel becomes less relevant if there is widespread community transmission where the patient lives.     No 7. COMMUNITY SPREAD: "Are there lots of cases of COVID-19 (community spread) where you live?" (See public health department website, if unsure)   * MAJOR community spread: high number of cases; numbers of cases are increasing; many people hospitalized.   * MINOR community spread: low number of  cases; not increasing; few or no people hospitalized     No 8. SYMPTOMS: "Do you have any symptoms?" (e.g., fever, cough, breathing difficulty)     Diarrhea, fever up to 100.8 9. PREGNANCY OR POSTPARTUM: "Is there any chance you are pregnant?" "When was your last menstrual period?" "Did you deliver in the last 2 weeks?"     No 10. HIGH RISK: "Do you have any heart or lung problems? Do you have a weak immune system?" (e.g., CHF, COPD, asthma, HIV positive, chemotherapy, renal failure, diabetes mellitus, sickle cell anemia)      No  Protocols used: CORONAVIRUS (COVID-19) EXPOSURE-A-AH

## 2018-08-17 ENCOUNTER — Other Ambulatory Visit (INDEPENDENT_AMBULATORY_CARE_PROVIDER_SITE_OTHER): Payer: Self-pay

## 2018-08-17 DIAGNOSIS — I10 Essential (primary) hypertension: Secondary | ICD-10-CM

## 2018-08-17 MED ORDER — LOSARTAN POTASSIUM 100 MG PO TABS: 100.0000 mg | ORAL_TABLET | Freq: Every day | ORAL | 0 refills | Status: DC

## 2018-08-17 NOTE — Telephone Encounter (Signed)
Not yet awaiting pt to return call

## 2018-08-17 NOTE — Telephone Encounter (Signed)
Has this been sent in to another pharmacy yet?

## 2018-08-17 NOTE — Telephone Encounter (Signed)
Called pt again. Sent prescription into [facility] confirmed they medication in stock. Renee Ramus, LPN

## 2018-08-25 ENCOUNTER — Encounter (INDEPENDENT_AMBULATORY_CARE_PROVIDER_SITE_OTHER): Payer: Self-pay | Admitting: Family Medicine

## 2018-08-25 ENCOUNTER — Other Ambulatory Visit: Payer: Self-pay

## 2018-08-25 ENCOUNTER — Ambulatory Visit (INDEPENDENT_AMBULATORY_CARE_PROVIDER_SITE_OTHER): Payer: BLUE CROSS/BLUE SHIELD | Admitting: Family Medicine

## 2018-08-25 DIAGNOSIS — I10 Essential (primary) hypertension: Secondary | ICD-10-CM

## 2018-08-25 DIAGNOSIS — F3289 Other specified depressive episodes: Secondary | ICD-10-CM | POA: Diagnosis not present

## 2018-08-25 DIAGNOSIS — Z6836 Body mass index (BMI) 36.0-36.9, adult: Secondary | ICD-10-CM | POA: Diagnosis not present

## 2018-08-25 MED ORDER — LOSARTAN POTASSIUM 100 MG PO TABS: 100.0000 mg | ORAL_TABLET | Freq: Every day | ORAL | 0 refills | Status: DC

## 2018-08-25 NOTE — Progress Notes (Signed)
Office: [phone]  /  Fax: [phone] TeleHealth Visit:  Theresa Mathews has verbally consented to this TeleHealth visit today. The patient is located at home, the provider is located at the [employer] [facility] office. The participants in this visit include the listed provider and patient. The visit was conducted today via face time.  HPI:   Chief Complaint: OBESITY Theresa Mathews is here to discuss her progress with her obesity treatment plan. She is on the Category 2 plan and is following her eating plan approximately 50 % of the time. She states she is walking for 20-25 minutes 3 times per week. Breely weighed in at 222 lbs today and has gained 2 lbs. She has been ill and off the plan. She took [name] with her primary care physician but was discontinued when starting our clinic. We discussed restarting Saxenda and she will consider this. She lost about 15 lbs on Saxenda.  We were unable to weigh the patient today for this TeleHealth visit. She unsure if she lost or gained weight since her last visit. She has lost 5 lbs since starting treatment with [name].  Hypertension Theresa Mathews is a 53 y.o. female with hypertension. Theresa Mathews's blood pressure is well controlled on Losartan. Her blood pressure averages at 130/65 at home. She denies chest pain or shortness of breath. She is working on weight loss to help control her blood pressure with the goal of decreasing her risk of heart attack and stroke.   Depression with Emotional Eating Behaviors Theresa Mathews does not always remember to take Topamax at dinner. She does feel that it helps. Her mood is fairly stable on citalopram and Wellbutrin. Theresa Mathews struggles with emotional eating and using food for comfort to the extent that it is negatively impacting her health. She often snacks when she is not hungry. Theresa Mathews sometimes feels she is out of control and then feels guilty that she made poor food choices. She has been working on behavior modification  techniques to help reduce her emotional eating and has been somewhat successful. She shows no sign of suicidal or homicidal ideations.  Depression screen Theresa Mathews 2/9 12/02/2017 11/18/2017 11/04/2017 10/18/2017 09/13/2017  Decreased Interest 1 1 1 1 1   Down, Depressed, Hopeless 1 1 1 1 1   PHQ - 2 Score 2 2 2 2 2   Altered sleeping 1 1 1 1 2   Tired, decreased energy 1 1 2 1 3   Change in appetite 0 1 1 2 1   Feeling bad or failure about yourself  1 1 1 1 1   Trouble concentrating 0 1 1 0 0  Moving slowly or fidgety/restless 0 0 0 0 0  Suicidal thoughts 0 0 0 0 0  PHQ-9 Score 5 7 8 7 9   Difficult doing work/chores - - - - -    ASSESSMENT AND PLAN:  Essential hypertension - Plan: losartan (COZAAR) 100 MG tablet  Other depression - with emotional eating  Class 2 severe obesity with serious comorbidity and body mass index (BMI) of 36.0 to 36.9 in adult, unspecified obesity type (HCC)  PLAN:  Hypertension We discussed sodium restriction, working on healthy weight loss, and a regular exercise program as the means to achieve improved blood pressure control. Theresa Mathews agreed with this plan and agreed to follow up as directed. We will continue to monitor her blood pressure as well as her progress with the above lifestyle modifications. Theresa Mathews agrees to continue taking Losartan 100 mg qd #30 and we will refill for 1 month.  She will watch for signs of hypotension as she continues her lifestyle modifications. Theresa Mathews agrees to follow up with our clinic in 2 weeks.  Depression with Emotional Eating Behaviors We discussed behavior modification techniques today to help Theresa Mathews deal with her emotional eating and depression. Theresa Mathews agrees to continue citalopram, Wellbutrin, and Topamax. We dicussed strategies to remember to take Topamax at dinner. Theresa Mathews agrees to follow up with our clinic in 2 weeks.  Obesity Theresa Mathews is currently in the action stage of change. As such, her goal is to continue with weight loss  efforts She has agreed to follow the Category 2 plan Theresa Mathews has been instructed to work up to a goal of 150 minutes of combined cardio and strengthening exercise per week or continue as above for weight loss and overall health benefits. We discussed the following Behavioral Modification Strategies today: decreasing simple carbohydrates and keep a strict food journal   Theresa Mathews has agreed to follow up with our clinic in 2 weeks. She was informed of the importance of frequent follow up visits to maximize her success with intensive lifestyle modifications for her multiple health conditions.  ALLERGIES: No Known Allergies  MEDICATIONS: Current Outpatient Medications on File Prior to Visit  Medication Sig Dispense Refill   buPROPion (WELLBUTRIN SR) 200 MG 12 hr tablet Take 1 tablet (200 mg total) by mouth 2 (two) times daily. 60 tablet 0   citalopram (CELEXA) 10 MG tablet Take 1 tablet by mouth daily.     metFORMIN (GLUCOPHAGE) 500 MG tablet Take 1 tablet (500 mg total) by mouth daily with breakfast. 30 tablet 0   ranitidine (ZANTAC) 75 MG tablet Take 75 mg by mouth daily at 6 (six) AM.      topiramate (TOPAMAX) 100 MG tablet Take 1 tablet (100 mg total) by mouth at bedtime. 30 tablet 0   Vitamin D, Ergocalciferol, (DRISDOL) 1.25 MG (50000 UT) CAPS capsule Take 1 capsule (50,000 Units total) by mouth every 7 (seven) days. 4 capsule 0   Current Facility-Administered Medications on File Prior to Visit  Medication Dose Route Frequency Provider Last Rate Last Dose   0.9 %  sodium chloride infusion  500 mL Intravenous Once [name], MD        PAST MEDICAL HISTORY: Past Medical History:  Diagnosis Date   Anxiety    Back pain    Depression    Fatigue    GERD (gastroesophageal reflux disease)    Lactose intolerance     PAST SURGICAL HISTORY: Past Surgical History:  Procedure Laterality Date   BLADDER SUSPENSION N/A 01/23/2015   Procedure: TRANSVAGINAL TAPE (TVT)  PROCEDURE;  Surgeon: Bobbye Charleston, MD;  Location: [facility];  Service: Gynecology;  Laterality: N/A;   CYSTOSCOPY N/A 01/23/2015   Procedure: CYSTOSCOPY;  Surgeon: Bobbye Charleston, MD;  Location: [facility];  Service: Gynecology;  Laterality: N/A;   DILATION AND CURETTAGE OF UTERUS  2005   ROBOTIC ASSISTED TOTAL HYSTERECTOMY WITH BILATERAL SALPINGO OOPHERECTOMY Bilateral 01/23/2015   Procedure: ROBOTIC ASSISTED TOTAL HYSTERECTOMY WITH BILATERAL SALPINGO OOPHORECTOMY;  Surgeon: Bobbye Charleston, MD;  Location: [facility];  Service: Gynecology;  Laterality: Bilateral;    SOCIAL HISTORY: Social History   Tobacco Use   Smoking status: Former Smoker    Packs/day: 0.50    Years: 4.00    Pack years: 2.00   Smokeless tobacco: Never Used   Tobacco comment: smoked in college  Substance Use Topics   Alcohol use: Yes    Alcohol/week: 5.0 standard drinks  Types: 5 Glasses of wine per week   Drug use: No    FAMILY HISTORY: Family History  Problem Relation Age of Onset   Hypertension Mother    Ovarian cancer Mother    Heart disease Mother 71       a fib   Obesity Mother    Hypertension Father    Diabetes Father    Hyperlipidemia Father    Obesity Father     ROS: Review of Systems  Constitutional: Negative for weight loss.  Respiratory: Negative for shortness of breath.   Cardiovascular: Negative for chest pain.  Psychiatric/Behavioral: Positive for depression. Negative for suicidal ideas.    PHYSICAL EXAM: Pt in no acute distress  RECENT LABS AND TESTS: BMET    Component Value Date/Time   NA 141 03/14/2018 0835   K 4.9 03/14/2018 0835   CL 101 03/14/2018 0835   CO2 26 03/14/2018 0835   GLUCOSE 96 03/14/2018 0835   GLUCOSE 97 07/12/2017 1011   BUN 15 03/14/2018 0835   CREATININE 0.90 03/14/2018 0835   CALCIUM 9.2 03/14/2018 0835   GFRNONAA 74 03/14/2018 0835   GFRAA 86 03/14/2018 0835   Lab Results  Component Value Date   HGBA1C 5.4 03/14/2018   HGBA1C  5.4 11/24/2017   HGBA1C 5.3 08/26/2017   Lab Results  Component Value Date   INSULIN 9.5 03/14/2018   INSULIN 11.2 11/24/2017   INSULIN 9.4 08/26/2017   CBC    Component Value Date/Time   WBC 8.5 07/12/2017 1011   RBC 4.47 07/12/2017 1011   HGB 13.7 07/12/2017 1011   HCT 39.8 07/12/2017 1011   PLT 383.0 07/12/2017 1011   MCV 88.9 07/12/2017 1011   MCH 31.0 01/08/2015 0840   MCHC 34.5 07/12/2017 1011   RDW 13.7 07/12/2017 1011   LYMPHSABS 2.1 07/12/2017 1011   MONOABS 0.5 07/12/2017 1011   EOSABS 0.2 07/12/2017 1011   BASOSABS 0.1 07/12/2017 1011   Iron/TIBC/Ferritin/ %Sat No results found for: IRON, TIBC, FERRITIN, IRONPCTSAT Lipid Panel     Component Value Date/Time   CHOL 219 (H) 03/14/2018 0835   TRIG 135 03/14/2018 0835   HDL 60 03/14/2018 0835   CHOLHDL 4 07/12/2017 1011   VLDL 21.2 07/12/2017 1011   LDLCALC 132 (H) 03/14/2018 0835   Hepatic Function Panel     Component Value Date/Time   PROT 7.3 03/14/2018 0835   ALBUMIN 4.5 03/14/2018 0835   AST 17 03/14/2018 0835   ALT 15 03/14/2018 0835   ALKPHOS 102 03/14/2018 0835   BILITOT 0.3 03/14/2018 0835   BILIDIR 0.0 02/05/2011 0815      Component Value Date/Time   TSH 1.290 08/26/2017 0918   TSH 1.27 07/12/2017 1011   TSH 1.27 02/05/2011 0815      I, Trixie Dredge, am acting as Location manager for [employer], FNP-C.  I have reviewed the above documentation for accuracy and completeness, and I agree with the above.  - Jeanie Mccard, FNP-C.

## 2018-08-26 ENCOUNTER — Other Ambulatory Visit (INDEPENDENT_AMBULATORY_CARE_PROVIDER_SITE_OTHER): Payer: Self-pay | Admitting: Family Medicine

## 2018-08-26 DIAGNOSIS — E8881 Metabolic syndrome: Secondary | ICD-10-CM

## 2018-08-26 DIAGNOSIS — F418 Other specified anxiety disorders: Secondary | ICD-10-CM

## 2018-08-29 ENCOUNTER — Encounter (INDEPENDENT_AMBULATORY_CARE_PROVIDER_SITE_OTHER): Payer: Self-pay | Admitting: Family Medicine

## 2018-09-06 ENCOUNTER — Other Ambulatory Visit: Payer: Self-pay

## 2018-09-06 ENCOUNTER — Encounter (INDEPENDENT_AMBULATORY_CARE_PROVIDER_SITE_OTHER): Payer: Self-pay | Admitting: Family Medicine

## 2018-09-06 ENCOUNTER — Ambulatory Visit (INDEPENDENT_AMBULATORY_CARE_PROVIDER_SITE_OTHER): Payer: BLUE CROSS/BLUE SHIELD | Admitting: Family Medicine

## 2018-09-06 DIAGNOSIS — Z6836 Body mass index (BMI) 36.0-36.9, adult: Secondary | ICD-10-CM

## 2018-09-06 DIAGNOSIS — F3289 Other specified depressive episodes: Secondary | ICD-10-CM | POA: Diagnosis not present

## 2018-09-07 NOTE — Progress Notes (Signed)
Office: [phone]  /  Fax: [phone] TeleHealth Visit:  Theresa Mathews has verbally consented to this TeleHealth visit today. The patient is located at work, the provider is located at the [employer] [facility] office. The participants in this visit include the listed provider and patient. [place] was unable to use Face Time today and the telehealth visit was conducted via telephone.  HPI:   Chief Complaint: OBESITY Theresa Mathews is here to discuss her progress with her obesity treatment plan. She is on the Category 2 plan and is following her eating plan approximately 75 % of the time. She states she is walking 20 to 30 minutes 2 to 3 times per week. Theresa Mathews weighs 220 pounds today and has lost 2 pounds since her last TeleHealth visit. She has done better on the plan the past few weeks. Theresa Mathews denies polyphagia and feels that she goes off the plan due to stress eating versus actual hunger.  We were unable to weigh the patient today for this TeleHealth visit. She feels as if she has lost weight since her last visit. She has lost 5 lbs since starting treatment with [name].  Depression with emotional eating behaviors Theresa Mathews notes that Topamax is not really helping with stress eating. She admits mild depression due to her daughter graduating from high school. She is stress eating at times and is not seeing a counselor currently as she finished counseling with Dr. Mallie Mussel and does not want the stress of scheduling an extra appointment with a new counselor. Dr. Mallie Mussel had given her the name of a counselor to contact to continue counseling. It is hard for her to get off work for appointments . She is struggling with emotional eating and using food for comfort to the extent that it is negatively impacting her health. She often snacks when she is not hungry. Theresa Mathews sometimes feels she is out of control and then feels guilty that she made poor food choices. She has been working on behavior modification  techniques to help reduce her emotional eating and has been somewhat successful.   Depression screen [facility] 2/9 12/02/2017 11/18/2017 11/04/2017 10/18/2017 09/13/2017  Decreased Interest 1 1 1 1 1   Down, Depressed, Hopeless 1 1 1 1 1   PHQ - 2 Score 2 2 2 2 2   Altered sleeping 1 1 1 1 2   Tired, decreased energy 1 1 2 1 3   Change in appetite 0 1 1 2 1   Feeling bad or failure about yourself  1 1 1 1 1   Trouble concentrating 0 1 1 0 0  Moving slowly or fidgety/restless 0 0 0 0 0  Suicidal thoughts 0 0 0 0 0  PHQ-9 Score 5 7 8 7 9   Difficult doing work/chores - - - - -   ASSESSMENT AND PLAN:  Other depression - with emotional eating  Class 2 severe obesity with serious comorbidity and body mass index (BMI) of 36.0 to 36.9 in adult, unspecified obesity type (HCC)  PLAN:  Depression with Emotional Eating Behaviors We discussed behavior modification techniques today to help Theresa Mathews deal with her emotional eating and depression. She has agreed to discontinue Topamax with tapering instructions provided and to continue to take bupropion and citalopram. Theresa Mathews agreed to follow up as directed in 2 weeks.  I spent > than 50% of the 15 minute visit on counseling as documented in the note.  Obesity Theresa Mathews is currently in the action stage of change. As such, her goal is to continue with  weight loss efforts. She has agreed to follow the Category 2 plan with [employer]. The information handout was sent via [employer]. Latalia has been instructed to work up to a goal of 150 minutes of combined cardio and strengthening exercise per week for weight loss and overall health benefits. We discussed the following Behavioral Modification Strategies today: work on meal planning and easy cooking plans, keeping healthy foods in the home, and planning for success.  Theresa Mathews has agreed to follow up with our clinic in 2 weeks. She was informed of the importance of frequent follow up visits to maximize her success  with intensive lifestyle modifications for her multiple health conditions.  ALLERGIES: No Known Allergies  MEDICATIONS: Current Outpatient Medications on File Prior to Visit  Medication Sig Dispense Refill  . buPROPion (WELLBUTRIN SR) 200 MG 12 hr tablet Take 1 tablet (200 mg total) by mouth 2 (two) times daily. 60 tablet 0  . citalopram (CELEXA) 10 MG tablet Take 1 tablet by mouth daily.    [name] losartan (COZAAR) 100 MG tablet Take 1 tablet (100 mg total) by mouth daily. 30 tablet 0  . metFORMIN (GLUCOPHAGE) 500 MG tablet Take 1 tablet (500 mg total) by mouth daily with breakfast. 30 tablet 0  . ranitidine (ZANTAC) 75 MG tablet Take 75 mg by mouth daily at 6 (six) AM.     . Vitamin D, Ergocalciferol, (DRISDOL) 1.25 MG (50000 UT) CAPS capsule Take 1 capsule (50,000 Units total) by mouth every 7 (seven) days. 4 capsule 0   Current Facility-Administered Medications on File Prior to Visit  Medication Dose Route Frequency Provider Last Rate Last Dose  . 0.9 %  sodium chloride infusion  500 mL Intravenous Once [name], MD        PAST MEDICAL HISTORY: Past Medical History:  Diagnosis Date  . Anxiety   . Back pain   . Depression   . Fatigue   . GERD (gastroesophageal reflux disease)   . Lactose intolerance     PAST SURGICAL HISTORY: Past Surgical History:  Procedure Laterality Date  . BLADDER SUSPENSION N/A 01/23/2015   Procedure: TRANSVAGINAL TAPE (TVT) PROCEDURE;  Surgeon: Bobbye Charleston, MD;  Location: [facility];  Service: Gynecology;  Laterality: N/A;  . CYSTOSCOPY N/A 01/23/2015   Procedure: CYSTOSCOPY;  Surgeon: Bobbye Charleston, MD;  Location: [facility];  Service: Gynecology;  Laterality: N/A;  . DILATION AND CURETTAGE OF UTERUS  2005  . ROBOTIC ASSISTED TOTAL HYSTERECTOMY WITH BILATERAL SALPINGO OOPHERECTOMY Bilateral 01/23/2015   Procedure: ROBOTIC ASSISTED TOTAL HYSTERECTOMY WITH BILATERAL SALPINGO OOPHORECTOMY;  Surgeon: Bobbye Charleston, MD;  Location: [facility];  Service:  Gynecology;  Laterality: Bilateral;    SOCIAL HISTORY: Social History   Tobacco Use  . Smoking status: Former Smoker    Packs/day: 0.50    Years: 4.00    Pack years: 2.00  . Smokeless tobacco: Never Used  . Tobacco comment: smoked in college  Substance Use Topics  . Alcohol use: Yes    Alcohol/week: 5.0 standard drinks    Types: 5 Glasses of wine per week  . Drug use: No    FAMILY HISTORY: Family History  Problem Relation Age of Onset  . Hypertension Mother   . Ovarian cancer Mother   . Heart disease Mother 77       a fib  . Obesity Mother   . Hypertension Father   . Diabetes Father   . Hyperlipidemia Father   . Obesity Father     ROS:  Review of Systems  Endo/Heme/Allergies:       Negative for polyphagia.  Psychiatric/Behavioral: Positive for depression.    PHYSICAL EXAM: Pt in no acute distress  RECENT LABS AND TESTS: BMET    Component Value Date/Time   NA 141 03/14/2018 0835   K 4.9 03/14/2018 0835   CL 101 03/14/2018 0835   CO2 26 03/14/2018 0835   GLUCOSE 96 03/14/2018 0835   GLUCOSE 97 07/12/2017 1011   BUN 15 03/14/2018 0835   CREATININE 0.90 03/14/2018 0835   CALCIUM 9.2 03/14/2018 0835   GFRNONAA 74 03/14/2018 0835   GFRAA 86 03/14/2018 0835   Lab Results  Component Value Date   HGBA1C 5.4 03/14/2018   HGBA1C 5.4 11/24/2017   HGBA1C 5.3 08/26/2017   Lab Results  Component Value Date   INSULIN 9.5 03/14/2018   INSULIN 11.2 11/24/2017   INSULIN 9.4 08/26/2017   CBC    Component Value Date/Time   WBC 8.5 07/12/2017 1011   RBC 4.47 07/12/2017 1011   HGB 13.7 07/12/2017 1011   HCT 39.8 07/12/2017 1011   PLT 383.0 07/12/2017 1011   MCV 88.9 07/12/2017 1011   MCH 31.0 01/08/2015 0840   MCHC 34.5 07/12/2017 1011   RDW 13.7 07/12/2017 1011   LYMPHSABS 2.1 07/12/2017 1011   MONOABS 0.5 07/12/2017 1011   EOSABS 0.2 07/12/2017 1011   BASOSABS 0.1 07/12/2017 1011   Iron/TIBC/Ferritin/ %Sat No results found for: IRON, TIBC, FERRITIN,  IRONPCTSAT Lipid Panel     Component Value Date/Time   CHOL 219 (H) 03/14/2018 0835   TRIG 135 03/14/2018 0835   HDL 60 03/14/2018 0835   CHOLHDL 4 07/12/2017 1011   VLDL 21.2 07/12/2017 1011   LDLCALC 132 (H) 03/14/2018 0835   Hepatic Function Panel     Component Value Date/Time   PROT 7.3 03/14/2018 0835   ALBUMIN 4.5 03/14/2018 0835   AST 17 03/14/2018 0835   ALT 15 03/14/2018 0835   ALKPHOS 102 03/14/2018 0835   BILITOT 0.3 03/14/2018 0835   BILIDIR 0.0 02/05/2011 0815      Component Value Date/Time   TSH 1.290 08/26/2017 0918   TSH 1.27 07/12/2017 1011   TSH 1.27 02/05/2011 0815   Results for GUENEVERE, ROORDA (MRN [id number]) as of 09/07/2018 14:42  Ref. Range 03/14/2018 08:35  Vitamin D, 25-Hydroxy Latest Ref Range: 30.0 - 100.0 ng/mL 21.4 (L)     I, Marcille Blanco, CMA, am acting as Location manager for [employer], FNP-C.  I have reviewed the above documentation for accuracy and completeness, and I agree with the above.  - Megann Easterwood, FNP-C.

## 2018-09-20 ENCOUNTER — Other Ambulatory Visit: Payer: Self-pay

## 2018-09-20 ENCOUNTER — Encounter (INDEPENDENT_AMBULATORY_CARE_PROVIDER_SITE_OTHER): Payer: Self-pay | Admitting: Family Medicine

## 2018-09-20 ENCOUNTER — Telehealth (INDEPENDENT_AMBULATORY_CARE_PROVIDER_SITE_OTHER): Payer: BC Managed Care – PPO | Admitting: Family Medicine

## 2018-09-20 ENCOUNTER — Other Ambulatory Visit (INDEPENDENT_AMBULATORY_CARE_PROVIDER_SITE_OTHER): Payer: Self-pay | Admitting: Family Medicine

## 2018-09-20 DIAGNOSIS — E8881 Metabolic syndrome: Secondary | ICD-10-CM

## 2018-09-20 DIAGNOSIS — I10 Essential (primary) hypertension: Secondary | ICD-10-CM | POA: Diagnosis not present

## 2018-09-20 DIAGNOSIS — E7849 Other hyperlipidemia: Secondary | ICD-10-CM | POA: Diagnosis not present

## 2018-09-20 DIAGNOSIS — E559 Vitamin D deficiency, unspecified: Secondary | ICD-10-CM

## 2018-09-20 DIAGNOSIS — F418 Other specified anxiety disorders: Secondary | ICD-10-CM

## 2018-09-20 DIAGNOSIS — Z6836 Body mass index (BMI) 36.0-36.9, adult: Secondary | ICD-10-CM

## 2018-09-20 DIAGNOSIS — E88819 Insulin resistance, unspecified: Secondary | ICD-10-CM

## 2018-09-20 MED ORDER — LOSARTAN POTASSIUM 100 MG PO TABS: 100.0000 mg | ORAL_TABLET | Freq: Every day | ORAL | 0 refills | Status: DC

## 2018-09-20 MED ORDER — BUPROPION HCL ER (SR) 200 MG PO TB12: 200.0000 mg | ORAL_TABLET | Freq: Two times a day (BID) | ORAL | 0 refills | Status: DC

## 2018-09-20 MED ORDER — METFORMIN HCL 500 MG PO TABS: 500.0000 mg | ORAL_TABLET | Freq: Every day | ORAL | 0 refills | Status: DC

## 2018-09-20 MED ORDER — VITAMIN D (ERGOCALCIFEROL) 1.25 MG (50000 UNIT) PO CAPS: 50000.0000 [IU] | ORAL_CAPSULE | ORAL | 0 refills | Status: DC

## 2018-09-20 NOTE — Progress Notes (Signed)
Office: [phone]  /  Fax: [phone] TeleHealth Visit:  SHERRILYNN GUDGEL has verbally consented to this TeleHealth visit today. The patient is located at home, the provider is located at the [facility] office. The participants in this visit include the listed provider and patient. The visit was conducted today via face time.  HPI:   Chief Complaint: OBESITY Theresa Mathews is here to discuss her progress with her obesity treatment plan. She is on the Category 2 plan with additional lunch options and is following her eating plan approximately 75 % of the time. She states she is walking for 30 minutes 4 times per week. Janara's weight is stable at 220 lbs. She has been eating more takeout recently.  We were unable to weigh the patient today for this TeleHealth visit. She feels as if she has maintained her weight since her last visit. She has lost 5 lbs since starting treatment with [name].  Vitamin D Deficiency Ernestyne has a diagnosis of vitamin D deficiency. She is currently taking prescription Vit D, but level is not at goal. Last Vit D level was 26.4 on 03/14/18. She denies nausea, vomiting or muscle weakness.  Insulin Resistance Chamille has a diagnosis of insulin resistance based on her elevated fasting insulin level >5. Although Aleigh's blood glucose readings are still under good control, insulin resistance puts her at greater risk of metabolic syndrome and diabetes. She is taking metformin currently and denies polyphagia. She continues to work on diet and exercise to decrease risk of diabetes.  Hyperlipidemia Kyliah has hyperlipidemia and has been trying to improve her cholesterol levels with intensive lifestyle modification including a low saturated fat diet, exercise and weight loss. Last LDL was 132 (elevated), and HDL and triglycerides within normal limits. She is not on statin and denies any chest pain or shortness of breath.  Hypertension KANNON BAUM is a 53 y.o.  female with hypertension. Elgie's blood pressure is well controlled on losartan. Her home blood pressure run in 130's/68-70. She denies chest pain or shortness of breath. She is working on weight loss to help control her blood pressure with the goal of decreasing her risk of heart attack and stroke.  The 10-year ASCVD risk score [name], et al., 2013) is: 2.2%   Values used to calculate the score:     Age: 38 years     Sex: Female     Is Non-Hispanic African American: No     Diabetic: No     Tobacco smoker: No     Systolic Blood Pressure: 672 mmHg     Is BP treated: Yes     HDL Cholesterol: 60 mg/dL     Total Cholesterol: 219 mg/dL   Depression with Emotional Eating Behaviors Alvah's symptoms are fairly well controlled on bupropion and Celexa. Her mood is fairly stable. Arlissa struggles with emotional eating and using food for comfort to the extent that it is negatively impacting her health. She often snacks when she is not hungry. Chandra sometimes feels she is out of control and then feels guilty that she made poor food choices. She has been working on behavior modification techniques to help reduce her emotional eating and has been somewhat successful. She shows no sign of suicidal or homicidal ideations.  Depression screen [facility] 2/9 12/02/2017 11/18/2017 11/04/2017 10/18/2017 09/13/2017  Decreased Interest 1 1 1 1 1   Down, Depressed, Hopeless 1 1 1 1 1   PHQ - 2 Score 2 2 2 2  2  Altered sleeping 1 1 1 1 2   Tired, decreased energy 1 1 2 1 3   Change in appetite 0 1 1 2 1   Feeling bad or failure about yourself  1 1 1 1 1   Trouble concentrating 0 1 1 0 0  Moving slowly or fidgety/restless 0 0 0 0 0  Suicidal thoughts 0 0 0 0 0  PHQ-9 Score 5 7 8 7 9   Difficult doing work/chores - - - - -    ASSESSMENT AND PLAN:  Other hyperlipidemia - Plan: Lipid panel  Depression with anxiety - Plan: buPROPion (WELLBUTRIN SR) 200 MG 12 hr tablet  Essential hypertension - Plan: Comprehensive  metabolic panel, losartan (COZAAR) 100 MG tablet  Insulin resistance - Plan: Comprehensive metabolic panel, Hemoglobin A1c, Insulin, Free and Total, metFORMIN (GLUCOPHAGE) 500 MG tablet  Vitamin D deficiency - Plan: VITAMIN D 25 Hydroxy (Vit-D Deficiency, Fractures), Vitamin D, Ergocalciferol, (DRISDOL) 1.25 MG (50000 UT) CAPS capsule  PLAN:  Vitamin D Deficiency Korryn was informed that low vitamin D levels contributes to fatigue and are associated with obesity, breast, and colon cancer. Lyndall agrees to continue taking prescription Vit D 50,000 IU every week #4 and we will refill for 1 month. She will follow up for routine testing of vitamin D, at least 2-3 times per year. She was informed of the risk of over-replacement of vitamin D and agrees to not increase her dose unless she discusses this with [name] first. Vit D level was ordered today. Kathyrn agrees to follow up with our clinic in 2 to 3 weeks.  Insulin Resistance Taylyn will continue to work on weight loss, exercise, and decreasing simple carbohydrates in her diet to help decrease the risk of diabetes. We dicussed metformin including benefits and risks. She was informed that eating too many simple carbohydrates or too many calories at one sitting increases the likelihood of GI side effects. Carloyn agrees to continue taking metformin 500 mg q AM #30 and we will refill for 1 month. Insulin, fasting glucose, and A1c was ordered today. Neetu agrees to follow up with our clinic in 2 to 3 weeks as directed to monitor her progress.  Hyperlipidemia Alizay was informed of the American Heart Association Guidelines emphasizing intensive lifestyle modifications as the first line treatment for hyperlipidemia. We discussed many lifestyle modifications today in depth, and Willis will continue to work on decreasing saturated fats such as fatty red meat, butter and many fried foods. She will also increase vegetables and lean protein in her diet and continue to  work on exercise and weight loss efforts. FLP was ordered today. Kinga agrees to follow up with our clinic in 2 to 3 weeks.  Hypertension We discussed sodium restriction, working on healthy weight loss, and a regular exercise program as the means to achieve improved blood pressure control. Kaityln agreed with this plan and agreed to follow up as directed. We will continue to monitor her blood pressure as well as her progress with the above lifestyle modifications. Alantra agrees to continue taking losartan 100 mg qd #30 and we will refill for 1 month. She will watch for signs of hypotension as she continues her lifestyle modifications. Enas agrees to follow up with our clinic in 2 to 3 weeks.  Depression with Emotional Eating Behaviors We discussed behavior modification techniques today to help Maudean deal with her emotional eating and depression. Alianny agrees to continue taking bupropion 200 mg BID #60 and we will refill for 1 month. Laporscha agrees to  follow up with our clinic in 2 to 3 weeks.  Obesity Madilyne is currently in the action stage of change. As such, her goal is to continue with weight loss efforts She has agreed to follow the Category 2 plan Kalliope has been instructed to work up to a goal of 150 minutes of combined cardio and strengthening exercise per week or continue current regimen for weight loss and overall health benefits. We discussed the following Behavioral Modification Strategies today: decrease eating out and planning for success Patt will go to [facility] to have her labs done.  Zoriah has agreed to follow up with our clinic in 2 to 3 weeks. She was informed of the importance of frequent follow up visits to maximize her success with intensive lifestyle modifications for her multiple health conditions.  ALLERGIES: No Known Allergies  MEDICATIONS: Current Outpatient Medications on File Prior to Visit  Medication Sig Dispense Refill  . citalopram (CELEXA) 10 MG tablet  Take 1 tablet by mouth daily.    . ranitidine (ZANTAC) 75 MG tablet Take 75 mg by mouth daily at 6 (six) AM.      Current Facility-Administered Medications on File Prior to Visit  Medication Dose Route Frequency Provider Last Rate Last Dose  . 0.9 %  sodium chloride infusion  500 mL Intravenous Once [name], MD        PAST MEDICAL HISTORY: Past Medical History:  Diagnosis Date  . Anxiety   . Back pain   . Depression   . Fatigue   . GERD (gastroesophageal reflux disease)   . Lactose intolerance     PAST SURGICAL HISTORY: Past Surgical History:  Procedure Laterality Date  . BLADDER SUSPENSION N/A 01/23/2015   Procedure: TRANSVAGINAL TAPE (TVT) PROCEDURE;  Surgeon: Bobbye Charleston, MD;  Location: [facility];  Service: Gynecology;  Laterality: N/A;  . CYSTOSCOPY N/A 01/23/2015   Procedure: CYSTOSCOPY;  Surgeon: Bobbye Charleston, MD;  Location: [facility];  Service: Gynecology;  Laterality: N/A;  . DILATION AND CURETTAGE OF UTERUS  2005  . ROBOTIC ASSISTED TOTAL HYSTERECTOMY WITH BILATERAL SALPINGO OOPHERECTOMY Bilateral 01/23/2015   Procedure: ROBOTIC ASSISTED TOTAL HYSTERECTOMY WITH BILATERAL SALPINGO OOPHORECTOMY;  Surgeon: Bobbye Charleston, MD;  Location: [facility];  Service: Gynecology;  Laterality: Bilateral;    SOCIAL HISTORY: Social History   Tobacco Use  . Smoking status: Former Smoker    Packs/day: 0.50    Years: 4.00    Pack years: 2.00  . Smokeless tobacco: Never Used  . Tobacco comment: smoked in college  Substance Use Topics  . Alcohol use: Yes    Alcohol/week: 5.0 standard drinks    Types: 5 Glasses of wine per week  . Drug use: No    FAMILY HISTORY: Family History  Problem Relation Age of Onset  . Hypertension Mother   . Ovarian cancer Mother   . Heart disease Mother 3       a fib  . Obesity Mother   . Hypertension Father   . Diabetes Father   . Hyperlipidemia Father   . Obesity Father     ROS: Review of Systems  Constitutional: Negative for  weight loss.  Respiratory: Negative for shortness of breath.   Cardiovascular: Negative for chest pain.  Gastrointestinal: Negative for nausea and vomiting.  Musculoskeletal:       Negative muscle weakness  Endo/Heme/Allergies:       Negative polyphagia  Psychiatric/Behavioral: Positive for depression. Negative for suicidal ideas.    PHYSICAL EXAM: Pt in no acute  distress  RECENT LABS AND TESTS: BMET    Component Value Date/Time   NA 141 03/14/2018 0835   K 4.9 03/14/2018 0835   CL 101 03/14/2018 0835   CO2 26 03/14/2018 0835   GLUCOSE 96 03/14/2018 0835   GLUCOSE 97 07/12/2017 1011   BUN 15 03/14/2018 0835   CREATININE 0.90 03/14/2018 0835   CALCIUM 9.2 03/14/2018 0835   GFRNONAA 74 03/14/2018 0835   GFRAA 86 03/14/2018 0835   Lab Results  Component Value Date   HGBA1C 5.4 03/14/2018   HGBA1C 5.4 11/24/2017   HGBA1C 5.3 08/26/2017   Lab Results  Component Value Date   INSULIN 9.5 03/14/2018   INSULIN 11.2 11/24/2017   INSULIN 9.4 08/26/2017   CBC    Component Value Date/Time   WBC 8.5 07/12/2017 1011   RBC 4.47 07/12/2017 1011   HGB 13.7 07/12/2017 1011   HCT 39.8 07/12/2017 1011   PLT 383.0 07/12/2017 1011   MCV 88.9 07/12/2017 1011   MCH 31.0 01/08/2015 0840   MCHC 34.5 07/12/2017 1011   RDW 13.7 07/12/2017 1011   LYMPHSABS 2.1 07/12/2017 1011   MONOABS 0.5 07/12/2017 1011   EOSABS 0.2 07/12/2017 1011   BASOSABS 0.1 07/12/2017 1011   Iron/TIBC/Ferritin/ %Sat No results found for: IRON, TIBC, FERRITIN, IRONPCTSAT Lipid Panel     Component Value Date/Time   CHOL 219 (H) 03/14/2018 0835   TRIG 135 03/14/2018 0835   HDL 60 03/14/2018 0835   CHOLHDL 4 07/12/2017 1011   VLDL 21.2 07/12/2017 1011   LDLCALC 132 (H) 03/14/2018 0835   Hepatic Function Panel     Component Value Date/Time   PROT 7.3 03/14/2018 0835   ALBUMIN 4.5 03/14/2018 0835   AST 17 03/14/2018 0835   ALT 15 03/14/2018 0835   ALKPHOS 102 03/14/2018 0835   BILITOT 0.3 03/14/2018  0835   BILIDIR 0.0 02/05/2011 0815      Component Value Date/Time   TSH 1.290 08/26/2017 0918   TSH 1.27 07/12/2017 1011   TSH 1.27 02/05/2011 0815      I, Trixie Dredge, am acting as transcriptionist for [employer], FNP-C  I have reviewed the above documentation for accuracy and completeness, and I agree with the above.  - Majid Mccravy, FNP-C.

## 2018-09-20 NOTE — Telephone Encounter (Signed)
Please advise 

## 2018-09-22 DIAGNOSIS — E7849 Other hyperlipidemia: Secondary | ICD-10-CM | POA: Insufficient documentation

## 2018-10-03 DIAGNOSIS — I10 Essential (primary) hypertension: Secondary | ICD-10-CM | POA: Diagnosis not present

## 2018-10-03 DIAGNOSIS — E7849 Other hyperlipidemia: Secondary | ICD-10-CM | POA: Diagnosis not present

## 2018-10-03 DIAGNOSIS — E559 Vitamin D deficiency, unspecified: Secondary | ICD-10-CM | POA: Diagnosis not present

## 2018-10-03 DIAGNOSIS — E8881 Metabolic syndrome: Secondary | ICD-10-CM | POA: Diagnosis not present

## 2018-10-05 ENCOUNTER — Other Ambulatory Visit: Payer: Self-pay

## 2018-10-05 ENCOUNTER — Ambulatory Visit (INDEPENDENT_AMBULATORY_CARE_PROVIDER_SITE_OTHER): Payer: BC Managed Care – PPO | Admitting: Bariatrics

## 2018-10-05 VITALS — BP 135/80 | HR 72 | Temp 98.1°F | Ht 64.0 in | Wt 222.0 lb

## 2018-10-05 DIAGNOSIS — I10 Essential (primary) hypertension: Secondary | ICD-10-CM | POA: Diagnosis not present

## 2018-10-05 DIAGNOSIS — F3289 Other specified depressive episodes: Secondary | ICD-10-CM | POA: Diagnosis not present

## 2018-10-05 DIAGNOSIS — Z9189 Other specified personal risk factors, not elsewhere classified: Secondary | ICD-10-CM | POA: Diagnosis not present

## 2018-10-05 DIAGNOSIS — E559 Vitamin D deficiency, unspecified: Secondary | ICD-10-CM | POA: Diagnosis not present

## 2018-10-05 DIAGNOSIS — Z6838 Body mass index (BMI) 38.0-38.9, adult: Secondary | ICD-10-CM

## 2018-10-05 MED ORDER — VITAMIN D3 1.25 MG (50000 UT) PO CAPS: 1.0000 | ORAL_CAPSULE | ORAL | 0 refills | Status: DC

## 2018-10-05 NOTE — Progress Notes (Signed)
Office: [phone]  /  Fax: [phone]   HPI:   Chief Complaint: OBESITY Theresa Mathews is here to discuss her progress with her obesity treatment plan. She is on the Category 2 plan and is following her eating plan approximately 60% of the time. She states she is walking 30-40 minutes 2 times per week. Theresa Mathews has gained 7 lbs since her last in office visit in March.  Her weight is 222 lb (100.7 kg) today and has had a weight gain of 7 lbs since her last visit. She has lost 0 lbs since starting treatment with [name].  Vitamin D deficiency Theresa Mathews has a diagnosis of Vitamin D deficiency. Her last Vitamin D level was reported to be 25.6 on 10/03/2018. She is currently taking prescription Vit D and denies nausea, vomiting or muscle weakness.  At risk for osteopenia and osteoporosis Theresa Mathews is at higher risk of osteopenia and osteoporosis due to Vitamin D deficiency.   Hypertension Theresa Mathews is a 53 y.o. female with hypertension and is on Micardis.  Theresa Mathews denies chest pain or shortness of breath on exertion. She is working weight loss to help control her blood pressure with the goal of decreasing her risk of heart attack and stroke. Adysson's blood pressure is currently controlled at 135/80.  Depression with emotional eating behaviors Theresa Mathews is struggling with emotional eating and using food for comfort to the extent that it is negatively impacting her health. She often snacks when she is not hungry. Theresa Mathews sometimes feels she is out of control and then feels guilty that she made poor food choices. She has been working on behavior modification techniques to help reduce her emotional eating and has been somewhat successful. Theresa Mathews is currently taking Wellbutrin and has seen Dr. Mallie Mussel in the past. She shows no sign of suicidal or homicidal ideations.  Depression screen [facility] 2/9 12/02/2017 11/18/2017 11/04/2017 10/18/2017 09/13/2017  Decreased Interest 1 1 1 1 1   Down, Depressed, Hopeless 1 1 1  1 1   PHQ - 2 Score 2 2 2 2 2   Altered sleeping 1 1 1 1 2   Tired, decreased energy 1 1 2 1 3   Change in appetite 0 1 1 2 1   Feeling bad or failure about yourself  1 1 1 1 1   Trouble concentrating 0 1 1 0 0  Moving slowly or fidgety/restless 0 0 0 0 0  Suicidal thoughts 0 0 0 0 0  PHQ-9 Score 5 7 8 7 9   Difficult doing work/chores - - - - -   ASSESSMENT AND PLAN:  Vitamin D deficiency  Essential hypertension  Other depression - with emotional eating   At risk for osteoporosis  Class 2 severe obesity with serious comorbidity and body mass index (BMI) of 38.0 to 38.9 in adult, unspecified obesity type (HCC)  PLAN:  Vitamin D Deficiency Theresa Mathews was informed that low Vitamin D levels contributes to fatigue and are associated with obesity, breast, and colon cancer. She agrees to change to Vitamin D3 50,000 units twice a week #10 with 0 refills and will follow-up for routine testing of Vitamin D, at least 2-3 times per year. She was informed of the risk of over-replacement of Vitamin D and agrees to not increase her dose unless she discusses this with [name] first. Theresa Mathews agrees to follow-up with our clinic in 2 weeks.  At risk for osteopenia and osteoporosis Theresa Mathews was given extended  (15 minutes) osteoporosis prevention counseling today. Theresa Mathews is at risk for osteopenia  and osteoporsis due to her Vitamin D deficiency. She was encouraged to take her Vitamin D and follow her higher calcium diet and increase strengthening exercise to help strengthen her bones and decrease her risk of osteopenia and osteoporosis.  Hypertension We discussed sodium restriction, working on healthy weight loss, and a regular exercise program as the means to achieve improved blood pressure control. Theresa Mathews agreed with this plan and agreed to follow up as directed. We will continue to monitor her blood pressure as well as her progress with the above lifestyle modifications. She will continue her medications as prescribed  and will watch for signs of hypotension as she continues her lifestyle modifications.  Depression with Emotional Eating Behaviors We discussed behavior modification techniques today to help Theresa Mathews deal with her emotional eating and depression. We reviewed CBT techniques. Theresa Mathews will keep "bad things out of the house" and will keep good things at home.  Obesity Illona is currently in the action stage of change. As such, her goal is to continue with weight loss efforts She has agreed to follow the Pescatarian plan. [name] will work on meal planning, intentional eating, and will increase her protein intake. [name] has been instructed to work up to a goal of 150 minutes of combined cardio and strengthening exercise per week for weight loss and overall health benefits. We discussed the following Behavioral Modification Strategies today: increasing lean protein intake, decreasing simple carbohydrates, increasing vegetables, increase H20 intake, decrease eating out, no skipping meals, work on meal planning, easy cooking plans, and keeping healthy foods in the home.  Theresa Mathews has agreed to follow-up with our clinic in 2 weeks. She was informed of the importance of frequent follow-up visits to maximize her success with intensive lifestyle modifications for her multiple health conditions.  ALLERGIES: No Known Allergies  MEDICATIONS: Current Outpatient Medications on File Prior to Visit  Medication Sig Dispense Refill  . buPROPion (WELLBUTRIN SR) 200 MG 12 hr tablet Take 1 tablet (200 mg total) by mouth 2 (two) times daily. 60 tablet 0  . citalopram (CELEXA) 10 MG tablet Take 1 tablet by mouth daily.    . metFORMIN (GLUCOPHAGE) 500 MG tablet Take 1 tablet (500 mg total) by mouth daily with breakfast. 30 tablet 0  . ranitidine (ZANTAC) 75 MG tablet Take 75 mg by mouth daily at 6 (six) AM.     . telmisartan (MICARDIS) 80 MG tablet Please specify directions, refills and quantity 1 tablet 0   Current  Facility-Administered Medications on File Prior to Visit  Medication Dose Route Frequency Provider Last Rate Last Dose  . 0.9 %  sodium chloride infusion  500 mL Intravenous Once [name], MD        PAST MEDICAL HISTORY: Past Medical History:  Diagnosis Date  . Anxiety   . Back pain   . Depression   . Fatigue   . GERD (gastroesophageal reflux disease)   . Lactose intolerance     PAST SURGICAL HISTORY: Past Surgical History:  Procedure Laterality Date  . BLADDER SUSPENSION N/A 01/23/2015   Procedure: TRANSVAGINAL TAPE (TVT) PROCEDURE;  Surgeon: Bobbye Charleston, MD;  Location: [facility];  Service: Gynecology;  Laterality: N/A;  . CYSTOSCOPY N/A 01/23/2015   Procedure: CYSTOSCOPY;  Surgeon: Bobbye Charleston, MD;  Location: [facility];  Service: Gynecology;  Laterality: N/A;  . DILATION AND CURETTAGE OF UTERUS  2005  . ROBOTIC ASSISTED TOTAL HYSTERECTOMY WITH BILATERAL SALPINGO OOPHERECTOMY Bilateral 01/23/2015   Procedure: ROBOTIC ASSISTED TOTAL HYSTERECTOMY WITH BILATERAL SALPINGO OOPHORECTOMY;  Surgeon: Bobbye Charleston, MD;  Location: [facility];  Service: Gynecology;  Laterality: Bilateral;    SOCIAL HISTORY: Social History   Tobacco Use  . Smoking status: Former Smoker    Packs/day: 0.50    Years: 4.00    Pack years: 2.00  . Smokeless tobacco: Never Used  . Tobacco comment: smoked in college  Substance Use Topics  . Alcohol use: Yes    Alcohol/week: 5.0 standard drinks    Types: 5 Glasses of wine per week  . Drug use: No    FAMILY HISTORY: Family History  Problem Relation Age of Onset  . Hypertension Mother   . Ovarian cancer Mother   . Heart disease Mother 14       a fib  . Obesity Mother   . Hypertension Father   . Diabetes Father   . Hyperlipidemia Father   . Obesity Father    ROS: Review of Systems  Gastrointestinal: Negative for nausea and vomiting.  Musculoskeletal:       Negative for muscle weakness.  Psychiatric/Behavioral: Positive for depression  (emotional eating). Negative for suicidal ideas.       Negative for homicidal ideas.   PHYSICAL EXAM: Blood pressure 135/80, pulse 72, temperature 98.1 F (36.7 C), temperature source Oral, height 5\' 4"  (1.626 m), weight 222 lb (100.7 kg), SpO2 98 %. Body mass index is 38.11 kg/m. Physical Exam Vitals signs reviewed.  Constitutional:      Appearance: Normal appearance. She is obese.  Cardiovascular:     Rate and Rhythm: Normal rate.     Pulses: Normal pulses.  Pulmonary:     Effort: Pulmonary effort is normal.     Breath sounds: Normal breath sounds.  Musculoskeletal: Normal range of motion.  Skin:    General: Skin is warm and dry.  Neurological:     Mental Status: She is alert and oriented to person, place, and time.  Psychiatric:        Behavior: Behavior normal.   RECENT LABS AND TESTS: BMET    Component Value Date/Time   NA 141 10/03/2018 0932   K 5.2 10/03/2018 0932   CL 103 10/03/2018 0932   CO2 23 10/03/2018 0932   GLUCOSE 109 (H) 10/03/2018 0932   GLUCOSE 97 07/12/2017 1011   BUN 11 10/03/2018 0932   CREATININE 0.83 10/03/2018 0932   CALCIUM 9.3 10/03/2018 0932   GFRNONAA 81 10/03/2018 0932   GFRAA 94 10/03/2018 0932   Lab Results  Component Value Date   HGBA1C 5.4 10/03/2018   HGBA1C 5.4 03/14/2018   HGBA1C 5.4 11/24/2017   HGBA1C 5.3 08/26/2017   Lab Results  Component Value Date   INSULIN 9.5 03/14/2018   INSULIN 11.2 11/24/2017   INSULIN 9.4 08/26/2017   CBC    Component Value Date/Time   WBC 8.5 07/12/2017 1011   RBC 4.47 07/12/2017 1011   HGB 13.7 07/12/2017 1011   HCT 39.8 07/12/2017 1011   PLT 383.0 07/12/2017 1011   MCV 88.9 07/12/2017 1011   MCH 31.0 01/08/2015 0840   MCHC 34.5 07/12/2017 1011   RDW 13.7 07/12/2017 1011   LYMPHSABS 2.1 07/12/2017 1011   MONOABS 0.5 07/12/2017 1011   EOSABS 0.2 07/12/2017 1011   BASOSABS 0.1 07/12/2017 1011   Iron/TIBC/Ferritin/ %Sat No results found for: IRON, TIBC, FERRITIN, IRONPCTSAT Lipid  Panel     Component Value Date/Time   CHOL 252 (H) 10/03/2018 0932   TRIG 200 (H) 10/03/2018 0932   HDL 56 10/03/2018 0932  CHOLHDL 4.5 (H) 10/03/2018 0932   CHOLHDL 4 07/12/2017 1011   VLDL 21.2 07/12/2017 1011   LDLCALC 156 (H) 10/03/2018 0932   Hepatic Function Panel     Component Value Date/Time   PROT 7.2 10/03/2018 0932   ALBUMIN 4.6 10/03/2018 0932   AST 18 10/03/2018 0932   ALT 14 10/03/2018 0932   ALKPHOS 112 10/03/2018 0932   BILITOT 0.2 10/03/2018 0932   BILIDIR 0.0 02/05/2011 0815      Component Value Date/Time   TSH 1.290 08/26/2017 0918   TSH 1.27 07/12/2017 1011   TSH 1.27 02/05/2011 0815   Results for CAMIYAH, FRIBERG (MRN [id number]) as of 10/05/2018 13:08  Ref. Range 10/03/2018 09:32  Vitamin D, 25-Hydroxy Latest Ref Range: 30.0 - 100.0 ng/mL 25.6 (L)   OBESITY BEHAVIORAL INTERVENTION VISIT  Today's visit was #21   Starting weight: 220 lbs Starting date: 08/26/2017 Today's weight: 222 lbs  Today's date: 10/05/2018 Total lbs lost to date: 0   10/05/2018  Height 5\' 4"  (1.626 m)  Weight 222 lb (100.7 kg)  BMI (Calculated) 38.09  BLOOD PRESSURE - SYSTOLIC 665  BLOOD PRESSURE - DIASTOLIC 80   Body Fat % 99.3 %  Total Body Water (lbs) 82 lbs   ASK: We discussed the diagnosis of obesity with Jannette Fogo today and Isola agreed to give [name] permission to discuss obesity behavioral modification therapy today.  ASSESS: Lissandra has the diagnosis of obesity and her BMI today is 38.2. Shary is in the action stage of change.   ADVISE: Ismelda was educated on the multiple health risks of obesity as well as the benefit of weight loss to improve her health. She was advised of the need for long term treatment and the importance of lifestyle modifications to improve her current health and to decrease her risk of future health problems.  AGREE: Multiple dietary modification options and treatment options were discussed and  Cilicia agreed to follow the  recommendations documented in the above note.  ARRANGE: Shanena was educated on the importance of frequent visits to treat obesity as outlined per CMS and USPSTF guidelines and agreed to schedule her next follow up appointment today.  Migdalia Dk, am acting as Location manager for [employer], DO  I have reviewed the above documentation for accuracy and completeness, and I agree with the above. -Jearld Lesch, DO

## 2018-10-06 ENCOUNTER — Encounter (INDEPENDENT_AMBULATORY_CARE_PROVIDER_SITE_OTHER): Payer: Self-pay | Admitting: Bariatrics

## 2018-10-08 LAB — LIPID PANEL
Chol/HDL Ratio: 4.5 ratio — ABNORMAL HIGH (ref 0.0–4.4)
Cholesterol, Total: 252 mg/dL — ABNORMAL HIGH (ref 100–199)
HDL: 56 mg/dL (ref >39)
LDL Calculated: 156 mg/dL — ABNORMAL HIGH (ref 0–99)
Triglycerides: 200 mg/dL — ABNORMAL HIGH (ref 0–149)
VLDL Cholesterol Cal: 40 mg/dL (ref 5–40)

## 2018-10-08 LAB — COMPREHENSIVE METABOLIC PANEL
ALT: 14 IU/L (ref 0–32)
AST: 18 IU/L (ref 0–40)
Albumin/Globulin Ratio: 1.8 (ref 1.2–2.2)
Albumin: 4.6 g/dL (ref 3.8–4.9)
Alkaline Phosphatase: 112 IU/L (ref 39–117)
BUN/Creatinine Ratio: 13 (ref 9–23)
BUN: 11 mg/dL (ref 6–24)
Bilirubin Total: 0.2 mg/dL (ref 0.0–1.2)
CO2: 23 mmol/L (ref 20–29)
Calcium: 9.3 mg/dL (ref 8.7–10.2)
Chloride: 103 mmol/L (ref 96–106)
Creatinine, Ser: 0.83 mg/dL (ref 0.57–1.00)
GFR calc Af Amer: 94 mL/min/{1.73_m2} (ref >59)
GFR calc non Af Amer: 81 mL/min/{1.73_m2} (ref >59)
Globulin, Total: 2.6 g/dL (ref 1.5–4.5)
Glucose: 109 mg/dL — ABNORMAL HIGH (ref 65–99)
Potassium: 5.2 mmol/L (ref 3.5–5.2)
Sodium: 141 mmol/L (ref 134–144)
Total Protein: 7.2 g/dL (ref 6.0–8.5)

## 2018-10-08 LAB — INSULIN, FREE AND TOTAL
Free Insulin: 8.7 uU/mL
Total Insulin: 9 uU/mL

## 2018-10-08 LAB — HEMOGLOBIN A1C
Est. average glucose Bld gHb Est-mCnc: 108 mg/dL
Hgb A1c MFr Bld: 5.4 % (ref 4.8–5.6)

## 2018-10-08 LAB — VITAMIN D 25 HYDROXY (VIT D DEFICIENCY, FRACTURES): Vit D, 25-Hydroxy: 25.6 ng/mL — ABNORMAL LOW (ref 30.0–100.0)

## 2018-10-11 DIAGNOSIS — Z20828 Contact with and (suspected) exposure to other viral communicable diseases: Secondary | ICD-10-CM | POA: Diagnosis not present

## 2018-10-24 ENCOUNTER — Encounter (INDEPENDENT_AMBULATORY_CARE_PROVIDER_SITE_OTHER): Payer: Self-pay | Admitting: Bariatrics

## 2018-10-24 ENCOUNTER — Ambulatory Visit (INDEPENDENT_AMBULATORY_CARE_PROVIDER_SITE_OTHER): Payer: BC Managed Care – PPO | Admitting: Bariatrics

## 2018-10-24 ENCOUNTER — Other Ambulatory Visit: Payer: Self-pay

## 2018-10-24 VITALS — BP 115/75 | HR 69 | Temp 98.1°F | Ht 64.0 in | Wt 224.0 lb

## 2018-10-24 DIAGNOSIS — E559 Vitamin D deficiency, unspecified: Secondary | ICD-10-CM

## 2018-10-24 DIAGNOSIS — I10 Essential (primary) hypertension: Secondary | ICD-10-CM | POA: Diagnosis not present

## 2018-10-24 DIAGNOSIS — F3289 Other specified depressive episodes: Secondary | ICD-10-CM | POA: Diagnosis not present

## 2018-10-24 DIAGNOSIS — Z6838 Body mass index (BMI) 38.0-38.9, adult: Secondary | ICD-10-CM

## 2018-10-25 ENCOUNTER — Encounter (INDEPENDENT_AMBULATORY_CARE_PROVIDER_SITE_OTHER): Payer: Self-pay | Admitting: Bariatrics

## 2018-10-25 NOTE — Progress Notes (Signed)
Office: [phone]  /  Fax: [phone]   HPI:   Chief Complaint: OBESITY Theresa Mathews is here to discuss her progress with her obesity treatment plan. She is on the Pescatarian eating plan and is following her eating plan approximately 60% of the time. She states she is walking 30 minutes 2-3 times per week. Theresa Mathews is up 2 lbs since her last visit, but is doing well overall. She has done some emotional eating. She states her hunger is controlled.  Her weight is 224 lb (101.6 kg) today and has had a weight gain of 2 lbs since her last visit. She has lost 0 lbs since starting treatment with [name].  Vitamin D deficiency Theresa Mathews has a diagnosis of Vitamin D deficiency. Her last Vitamin D level was reported to be 25.6 on 10/03/2018. She is currently taking Vit D and denies nausea, vomiting or muscle weakness.  Hypertension Theresa Mathews is a 53 y.o. female with hypertension, which is well controlled.Theresa Mathews denies chest pain or shortness of breath on exertion. She is working weight loss to help control her blood pressure with the goal of decreasing her risk of heart attack and stroke.  Depression with emotional eating behaviors Theresa Mathews is struggling with emotional eating and using food for comfort to the extent that it is negatively impacting her health. She often snacks when she is not hungry. Theresa Mathews sometimes feels she is out of control and then feels guilty that she made poor food choices. She has been working on behavior modification techniques to help reduce her emotional eating and has been somewhat successful. Theresa Mathews is taking Wellbutrin and shows no sign of suicidal or homicidal ideations.  Depression screen Theresa Mathews 2/9 12/02/2017 11/18/2017 11/04/2017 10/18/2017 09/13/2017  Decreased Interest 1 1 1 1 1   Down, Depressed, Hopeless 1 1 1 1 1   PHQ - 2 Score 2 2 2 2 2   Altered sleeping 1 1 1 1 2   Tired, decreased energy 1 1 2 1 3   Change in appetite 0 1 1 2 1   Feeling bad or failure about  yourself  1 1 1 1 1   Trouble concentrating 0 1 1 0 0  Moving slowly or fidgety/restless 0 0 0 0 0  Suicidal thoughts 0 0 0 0 0  PHQ-9 Score 5 7 8 7 9   Difficult doing work/chores - - - - -   ASSESSMENT AND PLAN:  Vitamin D deficiency  Essential hypertension  Other depression  Class 2 severe obesity with serious comorbidity and body mass index (BMI) of 38.0 to 38.9 in adult, unspecified obesity type (HCC)  PLAN:  Vitamin D Deficiency Theresa Mathews was informed that low Vitamin D levels contributes to fatigue and are associated with obesity, breast, and colon cancer. She agrees to continue taking Vit D and will follow-up for routine testing of Vitamin D, at least 2-3 times per year. She was informed of the risk of over-replacement of Vitamin D and agrees to not increase her dose unless she discusses this with [name] first. Theresa Mathews agrees to follow-up with our clinic in 2 weeks.  Hypertension We discussed sodium restriction, working on healthy weight loss, and a regular exercise program as the means to achieve improved blood pressure control. Theresa Mathews agreed with this plan and agreed to follow up as directed. We will continue to monitor her blood pressure as well as her progress with the above lifestyle modifications. She will continue her medications as prescribed and will watch for signs of hypotension as  she continues her lifestyle modifications.  Depression with Emotional Eating Behaviors We discussed behavior modification techniques today to help Theresa Mathews deal with her emotional eating and depression. She will continue her medications and follow-up with [name] as directed to monitor her progress.  I spent > than 50% of the 15 minute visit on counseling as documented in the note.  Obesity Theresa Mathews is currently in the action stage of change. As such, her goal is to continue with weight loss efforts. She has agreed to follow the Pescatarian eating plan. Theresa Mathews will work on meal planning, intentional  eating, and increasing her protein intake. Theresa Mathews has been instructed to work up to a goal of 150 minutes of combined cardio and strengthening exercise per week for weight loss and overall health benefits. We discussed the following Behavioral Modification Strategies today: increasing lean protein intake, decreasing simple carbohydrates, increasing vegetables, increase H20 intake, decrease eating out, no skipping meals, work on meal planning and easy cooking plans, and keeping healthy foods in the home.  Theresa Mathews has agreed to follow-up with our clinic in 2 weeks. She was informed of the importance of frequent follow-up visits to maximize her success with intensive lifestyle modifications for her multiple health conditions.  ALLERGIES: No Known Allergies  MEDICATIONS: Current Outpatient Medications on File Prior to Visit  Medication Sig Dispense Refill   buPROPion (WELLBUTRIN SR) 200 MG 12 hr tablet Take 1 tablet (200 mg total) by mouth 2 (two) times daily. 60 tablet 0   Cholecalciferol (VITAMIN D3) 1.25 MG (50000 UT) CAPS Take 1 capsule by mouth 2 (two) times a week. 10 capsule 0   citalopram (CELEXA) 10 MG tablet Take 1 tablet by mouth daily.     metFORMIN (GLUCOPHAGE) 500 MG tablet Take 1 tablet (500 mg total) by mouth daily with breakfast. 30 tablet 0   ranitidine (ZANTAC) 75 MG tablet Take 75 mg by mouth daily at 6 (six) AM.      telmisartan (MICARDIS) 80 MG tablet Please specify directions, refills and quantity 1 tablet 0   Current Facility-Administered Medications on File Prior to Visit  Medication Dose Route Frequency Provider Last Rate Last Dose   0.9 %  sodium chloride infusion  500 mL Intravenous Once [name], MD        PAST MEDICAL HISTORY: Past Medical History:  Diagnosis Date   Anxiety    Back pain    Depression    Fatigue    GERD (gastroesophageal reflux disease)    Lactose intolerance     PAST SURGICAL HISTORY: Past Surgical History:  Procedure  Laterality Date   BLADDER SUSPENSION N/A 01/23/2015   Procedure: TRANSVAGINAL TAPE (TVT) PROCEDURE;  Surgeon: Bobbye Charleston, MD;  Location: [facility];  Service: Gynecology;  Laterality: N/A;   CYSTOSCOPY N/A 01/23/2015   Procedure: CYSTOSCOPY;  Surgeon: Bobbye Charleston, MD;  Location: [facility];  Service: Gynecology;  Laterality: N/A;   DILATION AND CURETTAGE OF UTERUS  2005   ROBOTIC ASSISTED TOTAL HYSTERECTOMY WITH BILATERAL SALPINGO OOPHERECTOMY Bilateral 01/23/2015   Procedure: ROBOTIC ASSISTED TOTAL HYSTERECTOMY WITH BILATERAL SALPINGO OOPHORECTOMY;  Surgeon: Bobbye Charleston, MD;  Location: [facility];  Service: Gynecology;  Laterality: Bilateral;    SOCIAL HISTORY: Social History   Tobacco Use   Smoking status: Former Smoker    Packs/day: 0.50    Years: 4.00    Pack years: 2.00   Smokeless tobacco: Never Used   Tobacco comment: smoked in college  Substance Use Topics   Alcohol use: Yes  Alcohol/week: 5.0 standard drinks    Types: 5 Glasses of wine per week   Drug use: No    FAMILY HISTORY: Family History  Problem Relation Age of Onset   Hypertension Mother    Ovarian cancer Mother    Heart disease Mother 25       a fib   Obesity Mother    Hypertension Father    Diabetes Father    Hyperlipidemia Father    Obesity Father    ROS: Review of Systems  Gastrointestinal: Negative for nausea and vomiting.  Musculoskeletal:       Negative for muscle weakness.  Psychiatric/Behavioral: Positive for depression (emotional eating). Negative for suicidal ideas.       Negative for homicidal ideas.   PHYSICAL EXAM: Blood pressure 115/75, pulse 69, temperature 98.1 F (36.7 C), temperature source Oral, height 5\' 4"  (1.626 m), weight 224 lb (101.6 kg), SpO2 98 %. Body mass index is 38.45 kg/m. Physical Exam Vitals signs reviewed.  Constitutional:      Appearance: Normal appearance. She is obese.  Cardiovascular:     Rate and Rhythm: Normal rate.     Pulses:  Normal pulses.  Pulmonary:     Effort: Pulmonary effort is normal.     Breath sounds: Normal breath sounds.  Musculoskeletal: Normal range of motion.  Skin:    General: Skin is warm and dry.  Neurological:     Mental Status: She is alert and oriented to person, place, and time.  Psychiatric:        Behavior: Behavior normal.   RECENT LABS AND TESTS: BMET    Component Value Date/Time   NA 141 10/03/2018 0932   K 5.2 10/03/2018 0932   CL 103 10/03/2018 0932   CO2 23 10/03/2018 0932   GLUCOSE 109 (H) 10/03/2018 0932   GLUCOSE 97 07/12/2017 1011   BUN 11 10/03/2018 0932   CREATININE 0.83 10/03/2018 0932   CALCIUM 9.3 10/03/2018 0932   GFRNONAA 81 10/03/2018 0932   GFRAA 94 10/03/2018 0932   Lab Results  Component Value Date   HGBA1C 5.4 10/03/2018   HGBA1C 5.4 03/14/2018   HGBA1C 5.4 11/24/2017   HGBA1C 5.3 08/26/2017   Lab Results  Component Value Date   INSULIN 9.5 03/14/2018   INSULIN 11.2 11/24/2017   INSULIN 9.4 08/26/2017   CBC    Component Value Date/Time   WBC 8.5 07/12/2017 1011   RBC 4.47 07/12/2017 1011   HGB 13.7 07/12/2017 1011   HCT 39.8 07/12/2017 1011   PLT 383.0 07/12/2017 1011   MCV 88.9 07/12/2017 1011   MCH 31.0 01/08/2015 0840   MCHC 34.5 07/12/2017 1011   RDW 13.7 07/12/2017 1011   LYMPHSABS 2.1 07/12/2017 1011   MONOABS 0.5 07/12/2017 1011   EOSABS 0.2 07/12/2017 1011   BASOSABS 0.1 07/12/2017 1011   Iron/TIBC/Ferritin/ %Sat No results found for: IRON, TIBC, FERRITIN, IRONPCTSAT Lipid Panel     Component Value Date/Time   CHOL 252 (H) 10/03/2018 0932   TRIG 200 (H) 10/03/2018 0932   HDL 56 10/03/2018 0932   CHOLHDL 4.5 (H) 10/03/2018 0932   CHOLHDL 4 07/12/2017 1011   VLDL 21.2 07/12/2017 1011   LDLCALC 156 (H) 10/03/2018 0932   Hepatic Function Panel     Component Value Date/Time   PROT 7.2 10/03/2018 0932   ALBUMIN 4.6 10/03/2018 0932   AST 18 10/03/2018 0932   ALT 14 10/03/2018 0932   ALKPHOS 112 10/03/2018 0932    BILITOT 0.2  10/03/2018 0932   BILIDIR 0.0 02/05/2011 0815      Component Value Date/Time   TSH 1.290 08/26/2017 0918   TSH 1.27 07/12/2017 1011   TSH 1.27 02/05/2011 0815   Results for KENISE, BARRACO (MRN [id number]) as of 10/25/2018 09:10  Ref. Range 10/03/2018 09:32  Vitamin D, 25-Hydroxy Latest Ref Range: 30.0 - 100.0 ng/mL 25.6 (L)   OBESITY BEHAVIORAL INTERVENTION VISIT  Today's visit was #23   Starting weight: 220 lbs Starting date: 08/26/2017 Today's weight: 224 lbs Today's date: 10/24/2018 Total lbs lost to date: 0   10/24/2018  Height 5\' 4"  (1.626 m)  Weight 224 lb (101.6 kg)  BMI (Calculated) 38.43  BLOOD PRESSURE - SYSTOLIC 827  BLOOD PRESSURE - DIASTOLIC 75   Body Fat % 07.8 %  Total Body Water (lbs) 84.4 lbs   ASK: We discussed the diagnosis of obesity with Theresa Mathews today and Javeah agreed to give [name] permission to discuss obesity behavioral modification therapy today.  ASSESS: Mikea has the diagnosis of obesity and her BMI today is 38.4. Marili is in the action stage of change.   ADVISE: Vaunda was educated on the multiple health risks of obesity as well as the benefit of weight loss to improve her health. She was advised of the need for long term treatment and the importance of lifestyle modifications to improve her current health and to decrease her risk of future health problems.  AGREE: Multiple dietary modification options and treatment options were discussed and  Falon agreed to follow the recommendations documented in the above note.  ARRANGE: Brittini was educated on the importance of frequent visits to treat obesity as outlined per CMS and USPSTF guidelines and agreed to schedule her next follow up appointment today.  Migdalia Dk, am acting as Location manager for [employer], DO  I have reviewed the above documentation for accuracy and completeness, and I agree with the above. -Jearld Lesch, DO

## 2018-11-05 ENCOUNTER — Other Ambulatory Visit (INDEPENDENT_AMBULATORY_CARE_PROVIDER_SITE_OTHER): Payer: Self-pay | Admitting: Bariatrics

## 2018-11-09 ENCOUNTER — Encounter (INDEPENDENT_AMBULATORY_CARE_PROVIDER_SITE_OTHER): Payer: Self-pay

## 2018-11-10 ENCOUNTER — Ambulatory Visit (INDEPENDENT_AMBULATORY_CARE_PROVIDER_SITE_OTHER): Payer: BC Managed Care – PPO | Admitting: Family Medicine

## 2018-11-10 ENCOUNTER — Other Ambulatory Visit: Payer: Self-pay

## 2018-11-10 ENCOUNTER — Encounter (INDEPENDENT_AMBULATORY_CARE_PROVIDER_SITE_OTHER): Payer: Self-pay | Admitting: Family Medicine

## 2018-11-10 VITALS — BP 145/81 | HR 70 | Temp 98.2°F | Ht 64.0 in | Wt 224.0 lb

## 2018-11-10 DIAGNOSIS — Z6838 Body mass index (BMI) 38.0-38.9, adult: Secondary | ICD-10-CM

## 2018-11-10 DIAGNOSIS — Z9189 Other specified personal risk factors, not elsewhere classified: Secondary | ICD-10-CM | POA: Diagnosis not present

## 2018-11-10 DIAGNOSIS — F3289 Other specified depressive episodes: Secondary | ICD-10-CM | POA: Diagnosis not present

## 2018-11-10 DIAGNOSIS — E559 Vitamin D deficiency, unspecified: Secondary | ICD-10-CM | POA: Diagnosis not present

## 2018-11-10 DIAGNOSIS — I10 Essential (primary) hypertension: Secondary | ICD-10-CM | POA: Diagnosis not present

## 2018-11-10 MED ORDER — CITALOPRAM HYDROBROMIDE 20 MG PO TABS: 20.0000 mg | ORAL_TABLET | Freq: Every day | ORAL | 0 refills | Status: DC

## 2018-11-10 MED ORDER — TELMISARTAN 80 MG PO TABS: ORAL_TABLET | ORAL | 0 refills | Status: DC

## 2018-11-10 MED ORDER — VITAMIN D3 1.25 MG (50000 UT) PO CAPS: 1.0000 | ORAL_CAPSULE | ORAL | 0 refills | Status: DC

## 2018-11-14 ENCOUNTER — Encounter (INDEPENDENT_AMBULATORY_CARE_PROVIDER_SITE_OTHER): Payer: Self-pay | Admitting: Family Medicine

## 2018-11-14 NOTE — Progress Notes (Signed)
Office: [phone]  /  Fax: [phone]   HPI:   Chief Complaint: OBESITY Theresa Mathews is here to discuss her progress with her obesity treatment plan. She is on the Pescatarian eating plan and is following her eating plan approximately 75% of the time. She states she is walking 20-30 minutes 2 times per week. Danett is feeling overwhelmed. Her husband has MS and much of the household work falls on her shoulders. She does enjoy the Pescatarian plan but feels she lacks motivation to stick to the plan. She feels that her weight loss is impeded by stress eating vs. polyphagia.  Her weight is 224 lb (101.6 kg) today and has not lost weight since her last visit. She has lost 0 lbs since starting treatment with [name].  Hypertension [name] is a 53 y.o. female with hypertension.  Theresa Mathews denies chest pain or shortness of breath on exertion. She is working weight loss to help control her blood pressure with the goal of decreasing her risk of heart attack and stroke. Sole's blood pressure is slightly elevated today at  145/81. She reports not taking her blood pressure medication consistently. BP Readings from Last 3 Encounters:  11/10/18 (!) 145/81  10/24/18 115/75  10/05/18 135/80   Vitamin D deficiency Theresa Mathews has a diagnosis of Vitamin D deficiency, which is not at goal. Her last Vitamin D was 25.6 on 10/03/2018. She is currently taking prescription Vit D and denies nausea, vomiting or muscle weakness.  At risk for osteopenia and osteoporosis Theresa Mathews is at higher risk of osteopenia and osteoporosis due to Vitamin D deficiency.   Depression with emotional eating behaviors Theresa Mathews is struggling with emotional eating and using food for comfort to the extent that it is negatively impacting her health. She often snacks when she is not hungry. Theresa Mathews sometimes feels she is out of control and then feels guilty that she made poor food choices. She has been working on behavior modification  techniques to help reduce her emotional eating and has been somewhat successful. Theresa Mathews is feeling overwhelmed. Her husband is disabled due to [place] and much of the household work falls on her shoulders. Theresa Mathews is on Celexa and bupropion. She is not currently seeing a Social worker. She was referred to Dr. Mallie Mussel but did not make an appointment. She shows no sign of suicidal or homicidal ideations.  Depression screen [facility] 2/9 12/02/2017 11/18/2017 11/04/2017 10/18/2017 09/13/2017  Decreased Interest 1 1 1 1 1   Down, Depressed, Hopeless 1 1 1 1 1   PHQ - 2 Score 2 2 2 2 2   Altered sleeping 1 1 1 1 2   Tired, decreased energy 1 1 2 1 3   Change in appetite 0 1 1 2 1   Feeling bad or failure about yourself  1 1 1 1 1   Trouble concentrating 0 1 1 0 0  Moving slowly or fidgety/restless 0 0 0 0 0  Suicidal thoughts 0 0 0 0 0  PHQ-9 Score 5 7 8 7 9   Difficult doing work/chores - - - - -   ASSESSMENT AND PLAN:  Essential hypertension - Plan: telmisartan (MICARDIS) 80 MG tablet  Vitamin D deficiency - Plan: Cholecalciferol (VITAMIN D3) 1.25 MG (50000 UT) CAPS  Other depression - Plan: citalopram (CELEXA) 20 MG tablet  At risk for osteoporosis  Class 2 severe obesity with serious comorbidity and body mass index (BMI) of 38.0 to 38.9 in adult, unspecified obesity type (HCC)  PLAN:  Hypertension We discussed sodium restriction, working  on healthy weight loss, and a regular exercise program as the means to achieve improved blood pressure control. Theresa Mathews agreed with this plan and agreed to follow up as directed. We will continue to monitor her blood pressure as well as her progress with the above lifestyle modifications. Theresa Mathews was given a refill on her telmisartan 80 mg daily #30 with 0 refills and agrees to follow-up with our clinic in 6 weeks.  She will watch for signs of hypotension as she continues her lifestyle modifications. I encouraged better compliance with the medication.   Vitamin D Deficiency Theresa Mathews  was informed that low Vitamin D levels contributes to fatigue and are associated with obesity, breast, and colon cancer. She agrees to continue to take prescription Vit D @ 50,000 IU every 3 days #10 with 0 refills and will follow-up for routine testing of Vitamin D, at least 2-3 times per year. She was informed of the risk of over-replacement of Vitamin D and agrees to not increase her dose unless she discusses this with [name] first. Theresa Mathews agrees to follow-up with our clinic in 6 weeks.  At risk for osteopenia and osteoporosis Theresa Mathews was given extended  (15 minutes) osteoporosis prevention counseling today. Theresa Mathews is at risk for osteopenia and osteoporosis due to her Vitamin D deficiency. She was encouraged to take her Vitamin D and follow her higher calcium diet and increase strengthening exercise to help strengthen her bones and decrease her risk of osteopenia and osteoporosis.  Depression with Emotional Eating Behaviors We discussed behavior modification techniques today to help Theresa Mathews deal with her emotional eating and depression. Theresa Mathews will increase her dose of Celexa to 20 mg daily #30 with 0 refills and agrees to follow-up with our clinic in 6 weeks. She will make an appointment with a counselor before her next OV.  She is concerned about missing work due to provider visits so I advised her that seeing a counselor is more important to her weight loss journey and mental health at this time so will have her follow up in 6 weeks to give her time to establish care with a counselor from the list provided to her by Dr. Mallie Mussel.   Obesity Theresa Mathews is currently in the action stage of change. As such, her goal is to continue with weight loss efforts. She has agreed to follow the Pescatarian eating plan. Theresa Mathews has been instructed to continue her current exercise regimen for weight loss and overall health benefits. We discussed the following Behavioral Modification Strategies today: increasing lean protein  intake and planning for success.  Theresa Mathews has agreed to follow-up with our clinic in 6 weeks.  She was informed of the importance of frequent follow-up visits to maximize her success with intensive lifestyle modifications for her multiple health conditions.  ALLERGIES: No Known Allergies  MEDICATIONS: Current Outpatient Medications on File Prior to Visit  Medication Sig Dispense Refill   buPROPion (WELLBUTRIN SR) 200 MG 12 hr tablet Take 1 tablet (200 mg total) by mouth 2 (two) times daily. 60 tablet 0   metFORMIN (GLUCOPHAGE) 500 MG tablet Take 1 tablet (500 mg total) by mouth daily with breakfast. 30 tablet 0   ranitidine (ZANTAC) 75 MG tablet Take 75 mg by mouth daily at 6 (six) AM.      Current Facility-Administered Medications on File Prior to Visit  Medication Dose Route Frequency Provider Last Rate Last Dose   0.9 %  sodium chloride infusion  500 mL Intravenous Once [name], MD  PAST MEDICAL HISTORY: Past Medical History:  Diagnosis Date   Anxiety    Back pain    Depression    Fatigue    GERD (gastroesophageal reflux disease)    Lactose intolerance     PAST SURGICAL HISTORY: Past Surgical History:  Procedure Laterality Date   BLADDER SUSPENSION N/A 01/23/2015   Procedure: TRANSVAGINAL TAPE (TVT) PROCEDURE;  Surgeon: Bobbye Charleston, MD;  Location: [facility];  Service: Gynecology;  Laterality: N/A;   CYSTOSCOPY N/A 01/23/2015   Procedure: CYSTOSCOPY;  Surgeon: Bobbye Charleston, MD;  Location: [facility];  Service: Gynecology;  Laterality: N/A;   DILATION AND CURETTAGE OF UTERUS  2005   ROBOTIC ASSISTED TOTAL HYSTERECTOMY WITH BILATERAL SALPINGO OOPHERECTOMY Bilateral 01/23/2015   Procedure: ROBOTIC ASSISTED TOTAL HYSTERECTOMY WITH BILATERAL SALPINGO OOPHORECTOMY;  Surgeon: Bobbye Charleston, MD;  Location: [facility];  Service: Gynecology;  Laterality: Bilateral;    SOCIAL HISTORY: Social History   Tobacco Use   Smoking status: Former Smoker     Packs/day: 0.50    Years: 4.00    Pack years: 2.00   Smokeless tobacco: Never Used   Tobacco comment: smoked in college  Substance Use Topics   Alcohol use: Yes    Alcohol/week: 5.0 standard drinks    Types: 5 Glasses of wine per week   Drug use: No    FAMILY HISTORY: Family History  Problem Relation Age of Onset   Hypertension Mother    Ovarian cancer Mother    Heart disease Mother 73       a fib   Obesity Mother    Hypertension Father    Diabetes Father    Hyperlipidemia Father    Obesity Father    ROS: Review of Systems  Respiratory: Negative for shortness of breath.   Cardiovascular: Negative for chest pain.  Gastrointestinal: Negative for nausea and vomiting.  Musculoskeletal:       Negative for muscle weakness.  Psychiatric/Behavioral: Positive for depression (emotional/stress eating). Negative for suicidal ideas.       Negative for homicidal ideas.   PHYSICAL EXAM: Blood pressure (!) 145/81, pulse 70, temperature 98.2 F (36.8 C), temperature source Oral, height 5\' 4"  (1.626 m), weight 224 lb (101.6 kg), SpO2 100 %. Body mass index is 38.45 kg/m. Physical Exam Vitals signs reviewed.  Constitutional:      Appearance: Normal appearance. She is obese.  Cardiovascular:     Rate and Rhythm: Normal rate.     Pulses: Normal pulses.  Pulmonary:     Effort: Pulmonary effort is normal.     Breath sounds: Normal breath sounds.  Musculoskeletal: Normal range of motion.  Skin:    General: Skin is warm and dry.  Neurological:     Mental Status: She is alert and oriented to person, place, and time.  Psychiatric:        Behavior: Behavior normal.        Thought Content: Thought content normal.     Comments: Tearful at times during visit.   RECENT LABS AND TESTS: BMET    Component Value Date/Time   NA 141 10/03/2018 0932   K 5.2 10/03/2018 0932   CL 103 10/03/2018 0932   CO2 23 10/03/2018 0932   GLUCOSE 109 (H) 10/03/2018 0932   GLUCOSE 97  07/12/2017 1011   BUN 11 10/03/2018 0932   CREATININE 0.83 10/03/2018 0932   CALCIUM 9.3 10/03/2018 0932   GFRNONAA 81 10/03/2018 0932   GFRAA 94 10/03/2018 0932   Lab Results  Component  Value Date   HGBA1C 5.4 10/03/2018   HGBA1C 5.4 03/14/2018   HGBA1C 5.4 11/24/2017   HGBA1C 5.3 08/26/2017   Lab Results  Component Value Date   INSULIN 9.5 03/14/2018   INSULIN 11.2 11/24/2017   INSULIN 9.4 08/26/2017   CBC    Component Value Date/Time   WBC 8.5 07/12/2017 1011   RBC 4.47 07/12/2017 1011   HGB 13.7 07/12/2017 1011   HCT 39.8 07/12/2017 1011   PLT 383.0 07/12/2017 1011   MCV 88.9 07/12/2017 1011   MCH 31.0 01/08/2015 0840   MCHC 34.5 07/12/2017 1011   RDW 13.7 07/12/2017 1011   LYMPHSABS 2.1 07/12/2017 1011   MONOABS 0.5 07/12/2017 1011   EOSABS 0.2 07/12/2017 1011   BASOSABS 0.1 07/12/2017 1011   Iron/TIBC/Ferritin/ %Sat No results found for: IRON, TIBC, FERRITIN, IRONPCTSAT Lipid Panel     Component Value Date/Time   CHOL 252 (H) 10/03/2018 0932   TRIG 200 (H) 10/03/2018 0932   HDL 56 10/03/2018 0932   CHOLHDL 4.5 (H) 10/03/2018 0932   CHOLHDL 4 07/12/2017 1011   VLDL 21.2 07/12/2017 1011   LDLCALC 156 (H) 10/03/2018 0932   Hepatic Function Panel     Component Value Date/Time   PROT 7.2 10/03/2018 0932   ALBUMIN 4.6 10/03/2018 0932   AST 18 10/03/2018 0932   ALT 14 10/03/2018 0932   ALKPHOS 112 10/03/2018 0932   BILITOT 0.2 10/03/2018 0932   BILIDIR 0.0 02/05/2011 0815      Component Value Date/Time   TSH 1.290 08/26/2017 0918   TSH 1.27 07/12/2017 1011   TSH 1.27 02/05/2011 0815   Results for CORISA, MONTINI (MRN [id number]) as of 11/14/2018 09:59  Ref. Range 10/03/2018 09:32  Vitamin D, 25-Hydroxy Latest Ref Range: 30.0 - 100.0 ng/mL 25.6 (L)   OBESITY BEHAVIORAL INTERVENTION VISIT  Today's visit was #24  Starting weight: 220 lbs Starting date: 08/26/2017 Today's weight: 224 lbs  Today's date: 11/10/2018 Total lbs lost to date: 0     11/10/2018  Height 5\' 4"  (1.626 m)  Weight 224 lb (101.6 kg)  BMI (Calculated) 38.43  BLOOD PRESSURE - SYSTOLIC 093  BLOOD PRESSURE - DIASTOLIC 81   Body Fat % 45 %  Total Body Water (lbs) 82.4 lbs   ASK: We discussed the diagnosis of obesity with Jannette Fogo today and Shevon agreed to give [name] permission to discuss obesity behavioral modification therapy today.  ASSESS: Malayja has the diagnosis of obesity and her BMI today is 38.5. Nayana is in the action stage of change.   ADVISE: Opha was educated on the multiple health risks of obesity as well as the benefit of weight loss to improve her health. She was advised of the need for long term treatment and the importance of lifestyle modifications to improve her current health and to decrease her risk of future health problems.  AGREE: Multiple dietary modification options and treatment options were discussed and  [name] agreed to follow the recommendations documented in the above note.  ARRANGE: Stayce was educated on the importance of frequent visits to treat obesity as outlined per CMS and USPSTF guidelines and agreed to schedule her next follow up appointment today.  IMichaelene Song, am acting as Location manager for [employer], FNP-C.  I have reviewed the above documentation for accuracy and completeness, and I agree with the above.  - Elzada Pytel, FNP-C.

## 2018-11-16 ENCOUNTER — Encounter (INDEPENDENT_AMBULATORY_CARE_PROVIDER_SITE_OTHER): Payer: Self-pay | Admitting: Family Medicine

## 2018-11-16 DIAGNOSIS — I10 Essential (primary) hypertension: Secondary | ICD-10-CM

## 2018-11-16 DIAGNOSIS — F3289 Other specified depressive episodes: Secondary | ICD-10-CM

## 2018-11-16 MED ORDER — CITALOPRAM HYDROBROMIDE 20 MG PO TABS: 20.0000 mg | ORAL_TABLET | Freq: Every day | ORAL | 0 refills | Status: DC

## 2018-11-16 MED ORDER — TELMISARTAN 80 MG PO TABS: 80.0000 mg | ORAL_TABLET | Freq: Every day | ORAL | 0 refills | Status: DC

## 2018-11-16 NOTE — Telephone Encounter (Signed)
Sure, that's fine.

## 2018-11-16 NOTE — Telephone Encounter (Signed)
Done

## 2018-11-16 NOTE — Telephone Encounter (Signed)
Insurance is requiring 90 prescriptions for both of these. Can I change it to 90 day?

## 2018-12-21 ENCOUNTER — Other Ambulatory Visit: Payer: Self-pay | Admitting: Family Medicine

## 2018-12-21 ENCOUNTER — Other Ambulatory Visit: Payer: Self-pay | Admitting: Obstetrics and Gynecology

## 2018-12-21 DIAGNOSIS — Z1231 Encounter for screening mammogram for malignant neoplasm of breast: Secondary | ICD-10-CM

## 2018-12-22 ENCOUNTER — Ambulatory Visit (INDEPENDENT_AMBULATORY_CARE_PROVIDER_SITE_OTHER): Payer: BC Managed Care – PPO | Admitting: Family Medicine

## 2018-12-22 ENCOUNTER — Other Ambulatory Visit: Payer: Self-pay

## 2018-12-22 VITALS — BP 114/75 | HR 65 | Temp 97.7°F | Ht 64.0 in | Wt 223.0 lb

## 2018-12-22 DIAGNOSIS — E88819 Insulin resistance, unspecified: Secondary | ICD-10-CM

## 2018-12-22 DIAGNOSIS — E8881 Metabolic syndrome: Secondary | ICD-10-CM | POA: Diagnosis not present

## 2018-12-22 DIAGNOSIS — Z6838 Body mass index (BMI) 38.0-38.9, adult: Secondary | ICD-10-CM

## 2018-12-22 DIAGNOSIS — Z9189 Other specified personal risk factors, not elsewhere classified: Secondary | ICD-10-CM

## 2018-12-22 DIAGNOSIS — F418 Other specified anxiety disorders: Secondary | ICD-10-CM | POA: Diagnosis not present

## 2018-12-22 MED ORDER — METFORMIN HCL 500 MG PO TABS: 500.0000 mg | ORAL_TABLET | Freq: Every day | ORAL | 0 refills | Status: DC

## 2018-12-27 ENCOUNTER — Encounter (INDEPENDENT_AMBULATORY_CARE_PROVIDER_SITE_OTHER): Payer: Self-pay | Admitting: Family Medicine

## 2018-12-27 NOTE — Progress Notes (Signed)
Office: [phone]  /  Fax: [phone]   HPI:   Chief Complaint: OBESITY Theresa Mathews is here to discuss Theresa Mathews progress with Theresa Mathews obesity treatment plan. She is on the Pescatarian plan and is following Theresa Mathews eating plan approximately 70 % of the time. She states she is exercising by walking for 25-30 minutes 4 times per week. Theresa Mathews is feeling much better as far as mood. She reports that Celexa dose increase at last visit has really helped Theresa Mathews mood. She gets protein in most of the time but sometimes does not get protein in at lunch.  Theresa Mathews weight is 223 lb (101.2 kg) today and has had a weight loss of 1 pound over a period of 6 weeks since Theresa Mathews last visit. She has lost 0 lbs since starting treatment with [name].  Insulin Resistance Theresa Mathews has a diagnosis of insulin resistance based on Theresa Mathews elevated fasting insulin level >5. Although Theresa Mathews's blood glucose readings are still under good control, insulin resistance puts Theresa Mathews at greater risk of metabolic syndrome and diabetes. She is taking metformin every morning currently and continues to work on diet and exercise to decrease risk of diabetes. She reports polyphagia.   At risk for diabetes Theresa Mathews is at higher than averagerisk for developing diabetes due to Theresa Mathews obesity. She currently denies polyuria or polydipsia.  Depression with Anxiety  Theresa Mathews is struggling with emotional eating and using food for comfort to the extent that it is negatively impacting Theresa Mathews health. She often snacks when she is not hungry. Theresa Mathews sometimes feels she is out of control and then feels guilty that she made poor food choices. She has been working on behavior modification techniques to help reduce Theresa Mathews emotional eating and has been somewhat successful. She states she is feeling better with increase dose of Celexa. Theresa Mathews mood is much improved and she is feeling slightly more energetic. She has seen a counselor as directed and has joined caregiver support group. Theresa Mathews husband has MS and she is  a caregiver as well as being responsible for most of the tasks in Theresa Mathews household. .  She shows no sign of suicidal or homicidal ideations.  Depression screen Theresa Mathews 2/9 12/02/2017 11/18/2017 11/04/2017 10/18/2017 09/13/2017  Decreased Interest 1 1 1 1 1   Down, Depressed, Hopeless 1 1 1 1 1   PHQ - 2 Score 2 2 2 2 2   Altered sleeping 1 1 1 1 2   Tired, decreased energy 1 1 2 1 3   Change in appetite 0 1 1 2 1   Feeling bad or failure about yourself  1 1 1 1 1   Trouble concentrating 0 1 1 0 0  Moving slowly or fidgety/restless 0 0 0 0 0  Suicidal thoughts 0 0 0 0 0  PHQ-9 Score 5 7 8 7 9   Difficult doing work/chores - - - - -     ASSESSMENT AND PLAN:  Insulin resistance - Plan: metFORMIN (GLUCOPHAGE) 500 MG tablet  Depression with anxiety  At risk for diabetes mellitus  Class 2 severe obesity with serious comorbidity and body mass index (BMI) of 38.0 to 38.9 in adult, unspecified obesity type ([place])  PLAN: Insulin Resistance Theresa Mathews will continue to work on weight loss, exercise, and decreasing simple carbohydrates in Theresa Mathews diet to help decrease the risk of diabetes. We dicussed metformin including benefits and risks. She was informed that eating too many simple carbohydrates or too many calories at one sitting increases the likelihood of GI side effects. Theresa Mathews agrees to continue Metformin 500 mg  daily #30 with no refills.  Theresa Mathews agreed to follow up with [name] as directed to monitor Theresa Mathews progress.  Diabetes risk counseling Theresa Mathews was given extended (15 minutes) diabetes prevention counseling today. She is 53 y.o. female and has risk factors for diabetes including obesity. We discussed intensive lifestyle modifications today with an emphasis on weight loss as well as increasing exercise and decreasing simple carbohydrates in Theresa Mathews diet.  Depression with Anxiety  We discussed behavior modification techniques today to help Theresa Mathews deal with Theresa Mathews emotional eating and depression. She has agreed to continue to  take Wellbutrin SR 200 mg qd and Celexa 20 mg daily. She agreed to follow up as directed.  Obesity Theresa Mathews is currently in the action stage of change. As such, Theresa Mathews goal is to continue with weight loss efforts She has agreed to follow the Category 2 plan or Pescatarian plan.  Theresa Mathews has been instructed to work continue exercise as above and add resistance training 2 times a week for weight loss and overall health benefits. We discussed the following Behavioral Modification Strategies today: increasing lean protein intake and planning for success.    Theresa Mathews has agreed to follow up with our clinic in 2-3 weeks. She was informed of the importance of frequent follow up visits to maximize Theresa Mathews success with intensive lifestyle modifications for Theresa Mathews multiple health conditions.  ALLERGIES: No Known Allergies  MEDICATIONS: Current Outpatient Medications on File Prior to Visit  Medication Sig Dispense Refill   buPROPion (WELLBUTRIN SR) 200 MG 12 hr tablet Take 1 tablet (200 mg total) by mouth 2 (two) times daily. 60 tablet 0   Cholecalciferol (VITAMIN D3) 1.25 MG (50000 UT) CAPS Take 1 capsule by mouth 2 (two) times a week. 10 capsule 0   citalopram (CELEXA) 20 MG tablet Take 1 tablet (20 mg total) by mouth daily. 90 tablet 0   ranitidine (ZANTAC) 75 MG tablet Take 75 mg by mouth daily at 6 (six) AM.      telmisartan (MICARDIS) 80 MG tablet Take 1 tablet (80 mg total) by mouth daily. Take one daily 90 tablet 0   Current Facility-Administered Medications on File Prior to Visit  Medication Dose Route Frequency Provider Last Rate Last Dose   0.9 %  sodium chloride infusion  500 mL Intravenous Once [name], MD        PAST MEDICAL HISTORY: Past Medical History:  Diagnosis Date   Anxiety    Back pain    Depression    Fatigue    GERD (gastroesophageal reflux disease)    Lactose intolerance     PAST SURGICAL HISTORY: Past Surgical History:  Procedure Laterality Date   BLADDER  SUSPENSION N/A 01/23/2015   Procedure: TRANSVAGINAL TAPE (TVT) PROCEDURE;  Surgeon: Bobbye Charleston, MD;  Location: [facility];  Service: Gynecology;  Laterality: N/A;   CYSTOSCOPY N/A 01/23/2015   Procedure: CYSTOSCOPY;  Surgeon: Bobbye Charleston, MD;  Location: [facility];  Service: Gynecology;  Laterality: N/A;   DILATION AND CURETTAGE OF UTERUS  2005   ROBOTIC ASSISTED TOTAL HYSTERECTOMY WITH BILATERAL SALPINGO OOPHERECTOMY Bilateral 01/23/2015   Procedure: ROBOTIC ASSISTED TOTAL HYSTERECTOMY WITH BILATERAL SALPINGO OOPHORECTOMY;  Surgeon: Bobbye Charleston, MD;  Location: [facility];  Service: Gynecology;  Laterality: Bilateral;    SOCIAL HISTORY: Social History   Tobacco Use   Smoking status: Former Smoker    Packs/day: 0.50    Years: 4.00    Pack years: 2.00   Smokeless tobacco: Never Used   Tobacco comment: smoked in college  Substance Use Topics   Alcohol use: Yes    Alcohol/week: 5.0 standard drinks    Types: 5 Glasses of wine per week   Drug use: No    FAMILY HISTORY: Family History  Problem Relation Age of Onset   Hypertension Mother    Ovarian cancer Mother    Heart disease Mother 56       a fib   Obesity Mother    Hypertension Father    Diabetes Father    Hyperlipidemia Father    Obesity Father     ROS: Review of Systems  Constitutional: Positive for weight loss.  Endo/Heme/Allergies: Negative for polydipsia.       Positive for polyphagia  Negative for polyuria  Psychiatric/Behavioral: Positive for depression. Negative for suicidal ideas.    PHYSICAL EXAM: Blood pressure 114/75, pulse 65, temperature 97.7 F (36.5 C), temperature source Oral, height 5\' 4"  (1.626 m), weight 223 lb (101.2 kg), SpO2 99 %. Body mass index is 38.28 kg/m. Physical Exam Vitals signs reviewed.  Constitutional:      Appearance: Normal appearance. She is obese.  HENT:     Head: Normocephalic.     Nose: Nose normal.  Neck:     Musculoskeletal: Normal range of  motion.  Cardiovascular:     Rate and Rhythm: Normal rate.  Pulmonary:     Effort: Pulmonary effort is normal.  Musculoskeletal: Normal range of motion.  Skin:    General: Skin is warm and dry.  Neurological:     Mental Status: She is alert and oriented to person, place, and time.  Psychiatric:        Mood and Affect: Mood normal.        Behavior: Behavior normal.     RECENT LABS AND TESTS: BMET    Component Value Date/Time   NA 141 10/03/2018 0932   K 5.2 10/03/2018 0932   CL 103 10/03/2018 0932   CO2 23 10/03/2018 0932   GLUCOSE 109 (H) 10/03/2018 0932   GLUCOSE 97 07/12/2017 1011   BUN 11 10/03/2018 0932   CREATININE 0.83 10/03/2018 0932   CALCIUM 9.3 10/03/2018 0932   GFRNONAA 81 10/03/2018 0932   GFRAA 94 10/03/2018 0932   Lab Results  Component Value Date   HGBA1C 5.4 10/03/2018   HGBA1C 5.4 03/14/2018   HGBA1C 5.4 11/24/2017   HGBA1C 5.3 08/26/2017   Lab Results  Component Value Date   INSULIN 9.5 03/14/2018   INSULIN 11.2 11/24/2017   INSULIN 9.4 08/26/2017   CBC    Component Value Date/Time   WBC 8.5 07/12/2017 1011   RBC 4.47 07/12/2017 1011   HGB 13.7 07/12/2017 1011   HCT 39.8 07/12/2017 1011   PLT 383.0 07/12/2017 1011   MCV 88.9 07/12/2017 1011   MCH 31.0 01/08/2015 0840   MCHC 34.5 07/12/2017 1011   RDW 13.7 07/12/2017 1011   LYMPHSABS 2.1 07/12/2017 1011   MONOABS 0.5 07/12/2017 1011   EOSABS 0.2 07/12/2017 1011   BASOSABS 0.1 07/12/2017 1011   Iron/TIBC/Ferritin/ %Sat No results found for: IRON, TIBC, FERRITIN, IRONPCTSAT Lipid Panel     Component Value Date/Time   CHOL 252 (H) 10/03/2018 0932   TRIG 200 (H) 10/03/2018 0932   HDL 56 10/03/2018 0932   CHOLHDL 4.5 (H) 10/03/2018 0932   CHOLHDL 4 07/12/2017 1011   VLDL 21.2 07/12/2017 1011   LDLCALC 156 (H) 10/03/2018 0932   Hepatic Function Panel     Component Value Date/Time   PROT 7.2 10/03/2018  0932   ALBUMIN 4.6 10/03/2018 0932   AST 18 10/03/2018 0932   ALT 14  10/03/2018 0932   ALKPHOS 112 10/03/2018 0932   BILITOT 0.2 10/03/2018 0932   BILIDIR 0.0 02/05/2011 0815      Component Value Date/Time   TSH 1.290 08/26/2017 0918   TSH 1.27 07/12/2017 1011   TSH 1.27 02/05/2011 0815      OBESITY BEHAVIORAL INTERVENTION VISIT  Today's visit was #25 Starting weight: 220 lbs Starting date: 08/26/17 Today's weight : Weight: 223 lb (101.2 kg)  Today's date: 12/22/18 Total lbs lost to date: 0 At least 15 minutes were spent on discussing the following behavioral intervention visit.   ASK: We discussed the diagnosis of obesity with Jannette Fogo today and Maybelline agreed to give [name] permission to discuss obesity behavioral modification therapy today.  ASSESS: Vikkie has the diagnosis of obesity and Theresa Mathews BMI today is 38.26 Braylei is in the action stage of change   ADVISE: Kaislynn was educated on the multiple health risks of obesity as well as the benefit of weight loss to improve Theresa Mathews health. She was advised of the need for long term treatment and the importance of lifestyle modifications to improve Theresa Mathews current health and to decrease Theresa Mathews risk of future health problems.  AGREE: Multiple dietary modification options and treatment options were discussed and  Ashten agreed to follow the recommendations documented in the above note.  ARRANGE: Yanissa was educated on the importance of frequent visits to treat obesity as outlined per CMS and USPSTF guidelines and agreed to schedule Theresa Mathews next follow up appointment today.  I, Renee Ramus, am acting as Location manager for [employer], [place].  I have reviewed the above documentation for accuracy and completeness, and I agree with the above.  - Valery Chance, FNP-C.

## 2019-01-12 ENCOUNTER — Other Ambulatory Visit: Payer: Self-pay

## 2019-01-12 ENCOUNTER — Ambulatory Visit (INDEPENDENT_AMBULATORY_CARE_PROVIDER_SITE_OTHER): Payer: BC Managed Care – PPO | Admitting: Family Medicine

## 2019-01-12 ENCOUNTER — Encounter (INDEPENDENT_AMBULATORY_CARE_PROVIDER_SITE_OTHER): Payer: Self-pay | Admitting: Family Medicine

## 2019-01-12 VITALS — BP 121/79 | HR 63 | Temp 97.9°F | Ht 64.0 in | Wt 222.0 lb

## 2019-01-12 DIAGNOSIS — F418 Other specified anxiety disorders: Secondary | ICD-10-CM

## 2019-01-12 DIAGNOSIS — Z9189 Other specified personal risk factors, not elsewhere classified: Secondary | ICD-10-CM

## 2019-01-12 DIAGNOSIS — E559 Vitamin D deficiency, unspecified: Secondary | ICD-10-CM

## 2019-01-12 DIAGNOSIS — Z6838 Body mass index (BMI) 38.0-38.9, adult: Secondary | ICD-10-CM

## 2019-01-12 MED ORDER — VITAMIN D3 1.25 MG (50000 UT) PO CAPS: 1.0000 | ORAL_CAPSULE | ORAL | 0 refills | Status: DC

## 2019-01-13 DIAGNOSIS — D225 Melanocytic nevi of trunk: Secondary | ICD-10-CM | POA: Diagnosis not present

## 2019-01-13 DIAGNOSIS — D2371 Other benign neoplasm of skin of right lower limb, including hip: Secondary | ICD-10-CM | POA: Diagnosis not present

## 2019-01-13 DIAGNOSIS — D2372 Other benign neoplasm of skin of left lower limb, including hip: Secondary | ICD-10-CM | POA: Diagnosis not present

## 2019-01-13 DIAGNOSIS — D2261 Melanocytic nevi of right upper limb, including shoulder: Secondary | ICD-10-CM | POA: Diagnosis not present

## 2019-01-17 NOTE — Progress Notes (Signed)
Office: [phone]  /  Fax: [phone]   HPI:   Chief Complaint: OBESITY Theresa Mathews is here to discuss her progress with her obesity treatment plan. She is on the Pescatarian eating plan and is following her eating plan approximately 75 % of the time. She states she is walking 25 to 30 minutes 4 times per week. Theresa Mathews's daughter tends to get her off track because she wants to eat out at places that make it difficult for Theresa Mathews to stick to plan. She stays on the plan well at breakfast. Her weight is 222 lb (100.7 kg) today and has had a weight loss of 1 pound over a period of 3 weeks since her last visit. She has gained 2 lbs since starting treatment with [name].  Vitamin D deficiency Theresa Mathews has a diagnosis of vitamin D deficiency. Theresa Mathews is on prescription vit D every 3 days. Her last vitamin D level was at 25.6 on 10/03/18 and was not at goal. She denies nausea, vomiting or muscle weakness.  At risk for osteopenia and osteoporosis Theresa Mathews is at higher risk of osteopenia and osteoporosis due to vitamin D deficiency.   Depression with anxiety Theresa Mathews's mood is stable on Zoloft and Bupropion. She does admit to stress eating. Theresa Mathews struggles with emotional eating and using food for comfort to the extent that it is negatively impacting her health. She often snacks when she is not hungry. Theresa Mathews sometimes feels she is out of control and then feels guilty that she made poor food choices. She has been working on behavior modification techniques to help reduce her emotional eating and has been somewhat successful. She shows no sign of suicidal or homicidal ideations.  ASSESSMENT AND PLAN:  Vitamin D deficiency - Plan: Cholecalciferol (VITAMIN D3) 1.25 MG (50000 UT) CAPS  Depression with anxiety  At risk for osteoporosis  Class 2 severe obesity with serious comorbidity and body mass index (BMI) of 38.0 to 38.9 in adult, unspecified obesity type ([place])  PLAN:  Vitamin D Deficiency [name] was  informed that low vitamin D levels contributes to fatigue and are associated with obesity, breast, and colon cancer. [name] agrees to continue to take prescription Vit D @50 ,000 IU every 3 days #10 with no refills and she will follow up for routine testing of vitamin D, at least 2-3 times per year. She was informed of the risk of over-replacement of vitamin D and agrees to not increase her dose unless she discusses this with [name] first. We will check vitamin D level at the next visit and [name] agrees to follow up at the agreed upon time.  At risk for osteopenia and osteoporosis Theresa Mathews was given extended  (15 minutes) osteoporosis prevention counseling today. Theresa Mathews is at risk for osteopenia and osteoporosis due to her vitamin D deficiency. She was encouraged to take her vitamin D and follow her higher calcium diet and increase strengthening exercise to help strengthen her bones and decrease her risk of osteopenia and osteoporosis.  Depression with anxiety We discussed behavior modification techniques today to help Theresa Mathews deal with her emotional eating and depression. She will continue all of her medications and follow up as directed.  Obesity Theresa Mathews is currently in the action stage of change. As such, her goal is to continue with weight loss efforts She has agreed to follow the Pescatarian eating plan Theresa Mathews will continue walking 25 to 30 minutes, 4 times per week for weight loss and overall health benefits. We discussed the following Behavioral Modification Strategies today: planning  for success, keeping healthy foods in the home, increasing lean protein intake, decreasing simple carbohydrates, decrease eating out, work on meal planning and easy cooking plans and dealing with family or coworker sabotage Handouts for restaurants was given to patient today.  Theresa Mathews has agreed to follow up with our clinic in 3 weeks. She was informed of the importance of frequent follow up visits to maximize her  success with intensive lifestyle modifications for her multiple health conditions.  ALLERGIES: No Known Allergies  MEDICATIONS: Current Outpatient Medications on File Prior to Visit  Medication Sig Dispense Refill  . buPROPion (WELLBUTRIN SR) 200 MG 12 hr tablet Take 1 tablet (200 mg total) by mouth 2 (two) times daily. 60 tablet 0  . citalopram (CELEXA) 20 MG tablet Take 1 tablet (20 mg total) by mouth daily. 90 tablet 0  . metFORMIN (GLUCOPHAGE) 500 MG tablet Take 1 tablet (500 mg total) by mouth daily with breakfast. 30 tablet 0  . ranitidine (ZANTAC) 75 MG tablet Take 75 mg by mouth daily at 6 (six) AM.     . telmisartan (MICARDIS) 80 MG tablet Take 1 tablet (80 mg total) by mouth daily. Take one daily 90 tablet 0   Current Facility-Administered Medications on File Prior to Visit  Medication Dose Route Frequency Provider Last Rate Last Dose  . 0.9 %  sodium chloride infusion  500 mL Intravenous Once [name], MD        PAST MEDICAL HISTORY: Past Medical History:  Diagnosis Date  . Anxiety   . Back pain   . Depression   . Fatigue   . GERD (gastroesophageal reflux disease)   . Lactose intolerance     PAST SURGICAL HISTORY: Past Surgical History:  Procedure Laterality Date  . BLADDER SUSPENSION N/A 01/23/2015   Procedure: TRANSVAGINAL TAPE (TVT) PROCEDURE;  Surgeon: Bobbye Charleston, MD;  Location: [facility];  Service: Gynecology;  Laterality: N/A;  . CYSTOSCOPY N/A 01/23/2015   Procedure: CYSTOSCOPY;  Surgeon: Bobbye Charleston, MD;  Location: [facility];  Service: Gynecology;  Laterality: N/A;  . DILATION AND CURETTAGE OF UTERUS  2005  . ROBOTIC ASSISTED TOTAL HYSTERECTOMY WITH BILATERAL SALPINGO OOPHERECTOMY Bilateral 01/23/2015   Procedure: ROBOTIC ASSISTED TOTAL HYSTERECTOMY WITH BILATERAL SALPINGO OOPHORECTOMY;  Surgeon: Bobbye Charleston, MD;  Location: [facility];  Service: Gynecology;  Laterality: Bilateral;    SOCIAL HISTORY: Social History   Tobacco Use  . Smoking  status: Former Smoker    Packs/day: 0.50    Years: 4.00    Pack years: 2.00  . Smokeless tobacco: Never Used  . Tobacco comment: smoked in college  Substance Use Topics  . Alcohol use: Yes    Alcohol/week: 5.0 standard drinks    Types: 5 Glasses of wine per week  . Drug use: No    FAMILY HISTORY: Family History  Problem Relation Age of Onset  . Hypertension Mother   . Ovarian cancer Mother   . Heart disease Mother 72       a fib  . Obesity Mother   . Hypertension Father   . Diabetes Father   . Hyperlipidemia Father   . Obesity Father     ROS: Review of Systems  Constitutional: Positive for weight loss.  Gastrointestinal: Negative for nausea and vomiting.  Musculoskeletal:       Negative for muscle weakness  Psychiatric/Behavioral: Positive for depression. Negative for suicidal ideas. The patient is nervous/anxious.     PHYSICAL EXAM: Blood pressure 121/79, pulse 63, temperature 97.9 F (36.6  C), temperature source Oral, height 5\' 4"  (1.626 m), weight 222 lb (100.7 kg), SpO2 100 %. Body mass index is 38.11 kg/m. Physical Exam Vitals signs reviewed.  Constitutional:      Appearance: Normal appearance. She is well-developed. She is obese.  Cardiovascular:     Rate and Rhythm: Normal rate.  Pulmonary:     Effort: Pulmonary effort is normal.  Musculoskeletal: Normal range of motion.  Skin:    General: Skin is warm and dry.  Neurological:     Mental Status: She is alert and oriented to person, place, and time.  Psychiatric:        Mood and Affect: Mood normal.        Behavior: Behavior normal.        Thought Content: Thought content does not include homicidal or suicidal ideation.     RECENT LABS AND TESTS: BMET    Component Value Date/Time   NA 141 10/03/2018 0932   K 5.2 10/03/2018 0932   CL 103 10/03/2018 0932   CO2 23 10/03/2018 0932   GLUCOSE 109 (H) 10/03/2018 0932   GLUCOSE 97 07/12/2017 1011   BUN 11 10/03/2018 0932   CREATININE 0.83  10/03/2018 0932   CALCIUM 9.3 10/03/2018 0932   GFRNONAA 81 10/03/2018 0932   GFRAA 94 10/03/2018 0932   Lab Results  Component Value Date   HGBA1C 5.4 10/03/2018   HGBA1C 5.4 03/14/2018   HGBA1C 5.4 11/24/2017   HGBA1C 5.3 08/26/2017   Lab Results  Component Value Date   INSULIN 9.5 03/14/2018   INSULIN 11.2 11/24/2017   INSULIN 9.4 08/26/2017   CBC    Component Value Date/Time   WBC 8.5 07/12/2017 1011   RBC 4.47 07/12/2017 1011   HGB 13.7 07/12/2017 1011   HCT 39.8 07/12/2017 1011   PLT 383.0 07/12/2017 1011   MCV 88.9 07/12/2017 1011   MCH 31.0 01/08/2015 0840   MCHC 34.5 07/12/2017 1011   RDW 13.7 07/12/2017 1011   LYMPHSABS 2.1 07/12/2017 1011   MONOABS 0.5 07/12/2017 1011   EOSABS 0.2 07/12/2017 1011   BASOSABS 0.1 07/12/2017 1011   Iron/TIBC/Ferritin/ %Sat No results found for: IRON, TIBC, FERRITIN, IRONPCTSAT Lipid Panel     Component Value Date/Time   CHOL 252 (H) 10/03/2018 0932   TRIG 200 (H) 10/03/2018 0932   HDL 56 10/03/2018 0932   CHOLHDL 4.5 (H) 10/03/2018 0932   CHOLHDL 4 07/12/2017 1011   VLDL 21.2 07/12/2017 1011   LDLCALC 156 (H) 10/03/2018 0932   Hepatic Function Panel     Component Value Date/Time   PROT 7.2 10/03/2018 0932   ALBUMIN 4.6 10/03/2018 0932   AST 18 10/03/2018 0932   ALT 14 10/03/2018 0932   ALKPHOS 112 10/03/2018 0932   BILITOT 0.2 10/03/2018 0932   BILIDIR 0.0 02/05/2011 0815      Component Value Date/Time   TSH 1.290 08/26/2017 0918   TSH 1.27 07/12/2017 1011   TSH 1.27 02/05/2011 0815     Ref. Range 10/03/2018 09:32  Vitamin D, 25-Hydroxy Latest Ref Range: 30.0 - 100.0 ng/mL 25.6 (L)    OBESITY BEHAVIORAL INTERVENTION VISIT  Today's visit was # 26  Starting weight: 220 lbs Starting date: 08/26/2017 Today's weight : 222 lbs Today's date: 01/12/2019 Total lbs lost to date: 0    01/12/2019  Height 5\' 4"  (1.626 m)  Weight 222 lb (100.7 kg)  BMI (Calculated) 38.09  BLOOD PRESSURE - SYSTOLIC [id number]  BLOOD  PRESSURE - DIASTOLIC 79  Body Fat % 44.1 %  Total Body Water (lbs) 81 lbs    ASK: We discussed the diagnosis of obesity with Jannette Fogo today and Ryna agreed to give [name] permission to discuss obesity behavioral modification therapy today.  ASSESS: Destinii has the diagnosis of obesity and her BMI today is 38.09 Pranshi is in the action stage of change   ADVISE: Achol was educated on the multiple health risks of obesity as well as the benefit of weight loss to improve her health. She was advised of the need for long term treatment and the importance of lifestyle modifications to improve her current health and to decrease her risk of future health problems.  AGREE: Multiple dietary modification options and treatment options were discussed and  Velisa agreed to follow the recommendations documented in the above note.  ARRANGE: Skyanna was educated on the importance of frequent visits to treat obesity as outlined per CMS and USPSTF guidelines and agreed to schedule her next follow up appointment today.  Corey Skains, am acting as Location manager for [employer], FNP-C.  I have reviewed the above documentation for accuracy and completeness, and I agree with the above.  - Gwen Sarvis, FNP-C.

## 2019-01-19 ENCOUNTER — Encounter (INDEPENDENT_AMBULATORY_CARE_PROVIDER_SITE_OTHER): Payer: Self-pay | Admitting: Family Medicine

## 2019-02-02 ENCOUNTER — Other Ambulatory Visit: Payer: Self-pay

## 2019-02-02 ENCOUNTER — Encounter (INDEPENDENT_AMBULATORY_CARE_PROVIDER_SITE_OTHER): Payer: Self-pay | Admitting: Family Medicine

## 2019-02-02 ENCOUNTER — Ambulatory Visit (INDEPENDENT_AMBULATORY_CARE_PROVIDER_SITE_OTHER): Payer: BC Managed Care – PPO | Admitting: Family Medicine

## 2019-02-02 VITALS — BP 125/82 | HR 69 | Temp 97.8°F | Ht 64.0 in | Wt 223.0 lb

## 2019-02-02 DIAGNOSIS — Z6838 Body mass index (BMI) 38.0-38.9, adult: Secondary | ICD-10-CM

## 2019-02-02 DIAGNOSIS — E8881 Metabolic syndrome: Secondary | ICD-10-CM

## 2019-02-02 DIAGNOSIS — E559 Vitamin D deficiency, unspecified: Secondary | ICD-10-CM

## 2019-02-02 DIAGNOSIS — Z9189 Other specified personal risk factors, not elsewhere classified: Secondary | ICD-10-CM

## 2019-02-02 DIAGNOSIS — E7849 Other hyperlipidemia: Secondary | ICD-10-CM

## 2019-02-02 DIAGNOSIS — F418 Other specified anxiety disorders: Secondary | ICD-10-CM

## 2019-02-03 LAB — COMPREHENSIVE METABOLIC PANEL
ALT: 14 IU/L (ref 0–32)
AST: 17 IU/L (ref 0–40)
Albumin/Globulin Ratio: 1.8 (ref 1.2–2.2)
Albumin: 4.4 g/dL (ref 3.8–4.9)
Alkaline Phosphatase: 97 IU/L (ref 39–117)
BUN/Creatinine Ratio: 17 (ref 9–23)
BUN: 17 mg/dL (ref 6–24)
Bilirubin Total: 0.4 mg/dL (ref 0.0–1.2)
CO2: 24 mmol/L (ref 20–29)
Calcium: 9.4 mg/dL (ref 8.7–10.2)
Chloride: 101 mmol/L (ref 96–106)
Creatinine, Ser: 0.99 mg/dL (ref 0.57–1.00)
GFR calc Af Amer: 76 mL/min/{1.73_m2} (ref >59)
GFR calc non Af Amer: 66 mL/min/{1.73_m2} (ref >59)
Globulin, Total: 2.4 g/dL (ref 1.5–4.5)
Glucose: 90 mg/dL (ref 65–99)
Potassium: 4.7 mmol/L (ref 3.5–5.2)
Sodium: 138 mmol/L (ref 134–144)
Total Protein: 6.8 g/dL (ref 6.0–8.5)

## 2019-02-03 LAB — LIPID PANEL WITH LDL/HDL RATIO
Cholesterol, Total: 201 mg/dL — ABNORMAL HIGH (ref 100–199)
HDL: 56 mg/dL (ref >39)
LDL Chol Calc (NIH): 128 mg/dL — ABNORMAL HIGH (ref 0–99)
LDL/HDL Ratio: 2.3 ratio (ref 0.0–3.2)
Triglycerides: 94 mg/dL (ref 0–149)
VLDL Cholesterol Cal: 17 mg/dL (ref 5–40)

## 2019-02-03 LAB — INSULIN, RANDOM: INSULIN: 7 u[IU]/mL (ref 2.6–24.9)

## 2019-02-03 LAB — VITAMIN B12: Vitamin B-12: 536 pg/mL (ref 232–1245)

## 2019-02-03 LAB — HEMOGLOBIN A1C
Est. average glucose Bld gHb Est-mCnc: 108 mg/dL
Hgb A1c MFr Bld: 5.4 % (ref 4.8–5.6)

## 2019-02-03 LAB — VITAMIN D 25 HYDROXY (VIT D DEFICIENCY, FRACTURES): Vit D, 25-Hydroxy: 41.4 ng/mL (ref 30.0–100.0)

## 2019-02-06 ENCOUNTER — Encounter (INDEPENDENT_AMBULATORY_CARE_PROVIDER_SITE_OTHER): Payer: Self-pay | Admitting: Family Medicine

## 2019-02-06 ENCOUNTER — Other Ambulatory Visit: Payer: Self-pay

## 2019-02-06 ENCOUNTER — Ambulatory Visit
Admission: RE | Admit: 2019-02-06 | Discharge: 2019-02-06 | Disposition: A | Discharge status: Home / Self Care | Payer: BC Managed Care – PPO | Source: Ambulatory Visit | Attending: Family Medicine | Admitting: Family Medicine

## 2019-02-06 DIAGNOSIS — Z1231 Encounter for screening mammogram for malignant neoplasm of breast: Secondary | ICD-10-CM

## 2019-02-06 NOTE — Progress Notes (Signed)
Office: [phone]  /  Fax: [phone]   HPI:   Chief Complaint: OBESITY Theresa Mathews is here to discuss her progress with her obesity treatment plan. She is on the Pescatarian eating plan and is following her eating plan approximately 70 % of the time. She states she is walking 30 minutes 2 times per week. Theresa Mathews has been putting down flooring in her home and she has been eating out quite often. She is also skipping breakfast. She reports being very fatigued. Theresa Mathews is trying to get back to cooking vs eating out.[name]  Her weight is 223 lb (101.2 kg) today and has had a weight gain of 1 pound over a period of 3 weeks since her last visit. She has gained 3 lbs since starting treatment with [name].  Insulin Resistance Theresa Mathews has a diagnosis of insulin resistance based on her elevated fasting insulin level >5. Although Theresa Mathews's blood glucose readings are still under good control, insulin resistance puts her at greater risk of metabolic syndrome and diabetes. She is on metformin once in the morning (will check B12). Theresa Mathews she continues to work on diet and exercise to decrease risk of diabetes. Theresa Mathews denies polyphagia.  At risk for diabetes Theresa Mathews is at higher than averagerisk for developing diabetes due to her obesity. She currently denies polyuria or polydipsia.  Hyperlipidemia Theresa Mathews has hyperlipidemia and she is not on statin. Her last LDL was 156. Her HDL is good at 56. Her triglyceride level was high at 200. She has been trying to improve her cholesterol levels with intensive lifestyle modification including a low saturated fat diet, exercise and weight loss. She denies any chest pain or shortness of breath. The 10-year ASCVD risk score Theresa Mathews., et al., 2013) is: 1.9%   Values used to calculate the score:     Age: 53 years     Sex: Female     Is Non-Hispanic African American: No     Diabetic: No     Tobacco smoker: No     Systolic Blood Pressure: 0000000 mmHg     Is BP treated: Yes     HDL  Cholesterol: 56 mg/dL     Total Cholesterol: 201 mg/dL   Vitamin D deficiency Theresa Mathews has a diagnosis of vitamin D deficiency. Her last vitamin D level was low at 25.6 on 10/03/18. She has been on twice weekly Vit D3. Theresa Mathews admits fatigue and she denies nausea, vomiting or muscle weakness.  Depression with Anxiety Theresa Mathews reports fatigue and she feels slightly depressed. She is on Bupropion and Celexa. She shows no sign of suicidal or homicidal ideations.  ASSESSMENT AND PLAN:  Insulin resistance - Plan: Comprehensive Metabolic Panel (CMET), HgB A1c, Insulin, random  Other hyperlipidemia - Plan: Lipid Panel With LDL/HDL Ratio  Vitamin D deficiency - Plan: Vitamin D (25 hydroxy), B12  Depression with anxiety  Class 2 severe obesity with serious comorbidity and body mass index (BMI) of 38.0 to 38.9 in adult, unspecified obesity type (Theresa Mathews)  PLAN:  Insulin Resistance Theresa Mathews will continue to work on weight loss, exercise, and decreasing simple carbohydrates in her diet to help decrease the risk of diabetes. We dicussed metformin including benefits and risks. She was informed that eating too many simple carbohydrates or too many calories at one sitting increases the likelihood of GI side effects. Crucita will continue metformin. We will check A1c, fasting insulin, glucose and B12 and she will follow up with [name] as directed to monitor her progress.  Diabetes risk counselling  Theresa Mathews was given extended (15 minutes) diabetes prevention counseling today. She is 53 y.o. female and has risk factors for diabetes including obesity. We discussed intensive lifestyle modifications today with an emphasis on weight loss as well as increasing exercise and decreasing simple carbohydrates in her diet.   Hyperlipidemia Theresa Mathews was informed of the American Heart Association Guidelines emphasizing intensive lifestyle modifications as the first line treatment for hyperlipidemia. We discussed many lifestyle  modifications today in depth, and Saniya will continue to work on decreasing saturated fats such as fatty red meat, butter and many fried foods. She will also increase vegetables and lean protein in her diet and continue to work on exercise and weight loss efforts. We will check fasting lipid panel today.  Vitamin D Deficiency Theresa Mathews was informed that low vitamin D levels contributes to fatigue and are associated with obesity, breast, and colon cancer. Theresa Mathews will continue to take prescription Vit D3 @50 ,000 IU every week and she will follow up for routine testing of vitamin D, at least 2-3 times per year. She was informed of the risk of over-replacement of vitamin D and agrees to not increase her dose unless she discusses this with [name] first. We will check vitamin D level today and Hadessah will follow up as directed.  Depression with Anxiety Theresa Mathews will continue Bupropion 200 mg two times daily and Celexa 20 mg daily and follow up as directed.  Obesity Theresa Mathews is currently in the action stage of change. As such, her goal is to continue with weight loss efforts She has agreed to follow the Pescatarian eating plan Theresa Mathews will increase walking for weight loss and overall health benefits. We discussed the following Behavioral Modification Strategies today: no skipping meals, increasing lean protein intake, decreasing simple carbohydrates, decrease eating out and work on meal planning and easy cooking plans  Theresa Mathews has agreed to follow up with our clinic in 3 weeks. She was informed of the importance of frequent follow up visits to maximize her success with intensive lifestyle modifications for her multiple health conditions.  ALLERGIES: No Known Allergies  MEDICATIONS: Current Outpatient Medications on File Prior to Visit  Medication Sig Dispense Refill  . buPROPion (WELLBUTRIN SR) 200 MG 12 hr tablet Take 1 tablet (200 mg total) by mouth 2 (two) times daily. 60 tablet 0  . Cholecalciferol (VITAMIN  D3) 1.25 MG (50000 UT) CAPS Take 1 capsule by mouth 2 (two) times a week. 10 capsule 0  . citalopram (CELEXA) 20 MG tablet Take 1 tablet (20 mg total) by mouth daily. 90 tablet 0  . metFORMIN (GLUCOPHAGE) 500 MG tablet Take 1 tablet (500 mg total) by mouth daily with breakfast. 30 tablet 0  . ranitidine (ZANTAC) 75 MG tablet Take 75 mg by mouth daily at 6 (six) AM.     . telmisartan (MICARDIS) 80 MG tablet Take 1 tablet (80 mg total) by mouth daily. Take one daily 90 tablet 0   Current Facility-Administered Medications on File Prior to Visit  Medication Dose Route Frequency Provider Last Rate Last Dose  . 0.9 %  sodium chloride infusion  500 mL Intravenous Once [name], MD        PAST MEDICAL HISTORY: Past Medical History:  Diagnosis Date  . Anxiety   . Back pain   . Depression   . Fatigue   . GERD (gastroesophageal reflux disease)   . Lactose intolerance     PAST SURGICAL HISTORY: Past Surgical History:  Procedure Laterality Date  . BLADDER SUSPENSION  N/A 01/23/2015   Procedure: TRANSVAGINAL TAPE (TVT) PROCEDURE;  Surgeon: Bobbye Charleston, MD;  Location: [facility];  Service: Gynecology;  Laterality: N/A;  . CYSTOSCOPY N/A 01/23/2015   Procedure: CYSTOSCOPY;  Surgeon: Bobbye Charleston, MD;  Location: [facility];  Service: Gynecology;  Laterality: N/A;  . DILATION AND CURETTAGE OF UTERUS  2005  . ROBOTIC ASSISTED TOTAL HYSTERECTOMY WITH BILATERAL SALPINGO OOPHERECTOMY Bilateral 01/23/2015   Procedure: ROBOTIC ASSISTED TOTAL HYSTERECTOMY WITH BILATERAL SALPINGO OOPHORECTOMY;  Surgeon: Bobbye Charleston, MD;  Location: [facility];  Service: Gynecology;  Laterality: Bilateral;    SOCIAL HISTORY: Social History   Tobacco Use  . Smoking status: Former Smoker    Packs/day: 0.50    Years: 4.00    Pack years: 2.00  . Smokeless tobacco: Never Used  . Tobacco comment: smoked in college  Substance Use Topics  . Alcohol use: Yes    Alcohol/week: 5.0 standard drinks    Types: 5 Glasses of  wine per week  . Drug use: No    FAMILY HISTORY: Family History  Problem Relation Age of Onset  . Hypertension Mother   . Ovarian cancer Mother   . Heart disease Mother 28       a fib  . Obesity Mother   . Hypertension Father   . Diabetes Father   . Hyperlipidemia Father   . Obesity Father     ROS: Review of Systems  Constitutional: Positive for malaise/fatigue. Negative for weight loss.  Respiratory: Negative for shortness of breath.   Cardiovascular: Negative for chest pain.  Gastrointestinal: Negative for nausea and vomiting.  Musculoskeletal:       Negative for muscle weakness  Endo/Heme/Allergies:       Negative for polyphagia  Psychiatric/Behavioral: Positive for depression. Negative for suicidal ideas.    PHYSICAL EXAM: Blood pressure 125/82, pulse 69, temperature 97.8 F (36.6 C), temperature source Oral, height 5\' 4"  (1.626 m), weight 223 lb (101.2 kg), SpO2 99 %. Body mass index is 38.28 kg/m. Physical Exam Vitals signs reviewed.  Constitutional:      Appearance: Normal appearance. She is well-developed. She is obese.  Cardiovascular:     Rate and Rhythm: Normal rate.  Pulmonary:     Effort: Pulmonary effort is normal.  Musculoskeletal: Normal range of motion.  Skin:    General: Skin is warm and dry.  Neurological:     Mental Status: She is alert and oriented to person, place, and time.  Psychiatric:        Mood and Affect: Mood normal.        Behavior: Behavior normal.        Thought Content: Thought content does not include homicidal or suicidal ideation.     RECENT LABS AND TESTS: BMET    Component Value Date/Time   NA 138 02/02/2019 1521   K 4.7 02/02/2019 1521   CL 101 02/02/2019 1521   CO2 24 02/02/2019 1521   GLUCOSE 90 02/02/2019 1521   GLUCOSE 97 07/12/2017 1011   BUN 17 02/02/2019 1521   CREATININE 0.99 02/02/2019 1521   CALCIUM 9.4 02/02/2019 1521   GFRNONAA 66 02/02/2019 1521   GFRAA 76 02/02/2019 1521   Lab Results   Component Value Date   HGBA1C 5.4 02/02/2019   HGBA1C 5.4 10/03/2018   HGBA1C 5.4 03/14/2018   HGBA1C 5.4 11/24/2017   HGBA1C 5.3 08/26/2017   Lab Results  Component Value Date   INSULIN 7.0 02/02/2019   INSULIN 9.5 03/14/2018   INSULIN 11.2 11/24/2017  INSULIN 9.4 08/26/2017   CBC    Component Value Date/Time   WBC 8.5 07/12/2017 1011   RBC 4.47 07/12/2017 1011   HGB 13.7 07/12/2017 1011   HCT 39.8 07/12/2017 1011   PLT 383.0 07/12/2017 1011   MCV 88.9 07/12/2017 1011   MCH 31.0 01/08/2015 0840   MCHC 34.5 07/12/2017 1011   RDW 13.7 07/12/2017 1011   LYMPHSABS 2.1 07/12/2017 1011   MONOABS 0.5 07/12/2017 1011   EOSABS 0.2 07/12/2017 1011   BASOSABS 0.1 07/12/2017 1011   Iron/TIBC/Ferritin/ %Sat No results found for: IRON, TIBC, FERRITIN, IRONPCTSAT Lipid Panel     Component Value Date/Time   CHOL 201 (H) 02/02/2019 1521   TRIG 94 02/02/2019 1521   HDL 56 02/02/2019 1521   CHOLHDL 4.5 (H) 10/03/2018 0932   CHOLHDL 4 07/12/2017 1011   VLDL 21.2 07/12/2017 1011   LDLCALC 128 (H) 02/02/2019 1521   Hepatic Function Panel     Component Value Date/Time   PROT 6.8 02/02/2019 1521   ALBUMIN 4.4 02/02/2019 1521   AST 17 02/02/2019 1521   ALT 14 02/02/2019 1521   ALKPHOS 97 02/02/2019 1521   BILITOT 0.4 02/02/2019 1521   BILIDIR 0.0 02/05/2011 0815      Component Value Date/Time   TSH 1.290 08/26/2017 0918   TSH 1.27 07/12/2017 1011   TSH 1.27 02/05/2011 0815     Ref. Range 02/02/2019 15:21  Vitamin D, 25-Hydroxy Latest Ref Range: 30.0 - 100.0 ng/mL 41.4    OBESITY BEHAVIORAL INTERVENTION VISIT  Today's visit was # 27  Starting weight: 220 lbs Starting date: 08/26/2017 Today's weight : 223 lbs Today's date: 02/02/2019 Total lbs lost to date: 0    02/02/2019  Height 5\' 4"  (1.626 m)  Weight 223 lb (101.2 kg)  BMI (Calculated) 38.26  BLOOD PRESSURE - SYSTOLIC 0000000  BLOOD PRESSURE - DIASTOLIC 82   Body Fat % 44 %  Total Body Water (lbs) 82.6 lbs     ASK: We discussed the diagnosis of obesity with Theresa Mathews today and Theresa Mathews agreed to give [name] permission to discuss obesity behavioral modification therapy today.  ASSESS: Theresa Mathews has the diagnosis of obesity and her BMI today is 38.26 Theresa Mathews is in the action stage of change   ADVISE: Theresa Mathews was educated on the multiple health risks of obesity as well as the benefit of weight loss to improve her health. She was advised of the need for long term treatment and the importance of lifestyle modifications to improve her current health and to decrease her risk of future health problems.  AGREE: Multiple dietary modification options and treatment options were discussed and  Etoile agreed to follow the recommendations documented in the above note.  ARRANGE: Pretty was educated on the importance of frequent visits to treat obesity as outlined per CMS and USPSTF guidelines and agreed to schedule her next follow up appointment today.  Corey Skains, am acting as Location manager for [employer], FNP-C.  I have reviewed the above documentation for accuracy and completeness, and I agree with the above.  - Kha Hari, FNP-C.

## 2019-02-27 ENCOUNTER — Encounter (INDEPENDENT_AMBULATORY_CARE_PROVIDER_SITE_OTHER): Payer: Self-pay | Admitting: Family Medicine

## 2019-02-27 ENCOUNTER — Other Ambulatory Visit: Payer: Self-pay

## 2019-02-27 ENCOUNTER — Ambulatory Visit (INDEPENDENT_AMBULATORY_CARE_PROVIDER_SITE_OTHER): Payer: BC Managed Care – PPO | Admitting: Family Medicine

## 2019-02-27 VITALS — BP 155/83 | HR 60 | Temp 97.9°F | Ht 64.0 in | Wt 221.0 lb

## 2019-02-27 DIAGNOSIS — F418 Other specified anxiety disorders: Secondary | ICD-10-CM | POA: Diagnosis not present

## 2019-02-27 DIAGNOSIS — E559 Vitamin D deficiency, unspecified: Secondary | ICD-10-CM

## 2019-02-27 DIAGNOSIS — E8881 Metabolic syndrome: Secondary | ICD-10-CM | POA: Diagnosis not present

## 2019-02-27 DIAGNOSIS — Z6838 Body mass index (BMI) 38.0-38.9, adult: Secondary | ICD-10-CM

## 2019-02-27 DIAGNOSIS — Z9189 Other specified personal risk factors, not elsewhere classified: Secondary | ICD-10-CM

## 2019-02-27 DIAGNOSIS — I10 Essential (primary) hypertension: Secondary | ICD-10-CM

## 2019-02-27 MED ORDER — VITAMIN D3 1.25 MG (50000 UT) PO CAPS: 1.0000 | ORAL_CAPSULE | ORAL | 0 refills | Status: DC

## 2019-02-27 MED ORDER — METFORMIN HCL 500 MG PO TABS: 500.0000 mg | ORAL_TABLET | Freq: Every day | ORAL | 0 refills | Status: DC

## 2019-02-27 MED ORDER — BUPROPION HCL ER (SR) 200 MG PO TB12: 200.0000 mg | ORAL_TABLET | Freq: Two times a day (BID) | ORAL | 0 refills | Status: DC

## 2019-02-27 NOTE — Progress Notes (Signed)
Office: [phone]  /  Fax: [phone]   HPI:   Chief Complaint: OBESITY Theresa Mathews is here to discuss her progress with her obesity treatment plan. She is on the Pescatarian plan and is following her eating plan approximately 70 % of the time. She states she is exercising 0 minutes 0 times per week. Ixel is doing well on the plan. She reports wanting to eat more comfort foods recently such as soup.  Her weight is 221 lb (100.2 kg) today and has had a weight loss of 2 pounds over a period of 3 weeks since her last visit. She has lost 0 lbs since starting treatment with [name].  Vitamin D Deficiency Theresa Mathews has a diagnosis of vitamin D deficiency. She is currently stable on vit D which is improved but not at goal. Theresa Mathews denies nausea, vomiting, or muscle weakness.  Hypertension Theresa Mathews is a 53 y.o. female with hypertension. Girl's blood pressure is elevated today. Theresa Mathews sometimes forgets to take her blood pressure medicine. She is working on weight loss to help control her blood pressure with the goal of decreasing her risk of heart attack and stroke. Theresa Mathews denies chest pain or shortness of breath. BP Readings from Last 3 Encounters:  02/27/19 (!) 155/83  02/02/19 125/82  01/12/19 121/79    Insulin Resistance Theresa Mathews has a diagnosis of insulin resistance based on her elevated fasting insulin level >5. Although [name]'s blood glucose readings are still under good control, insulin resistance puts her at greater risk of metabolic syndrome and diabetes. Theresa Mathews denies polyphagia, but does report some food cravings. She is taking metformin currently and her A1c is stable at 5.4 on 02/02/19. She continues to work on diet and exercise to decrease risk of diabetes.  At risk for diabetes Theresa Mathews is at higher than average risk for developing diabetes due to her insulin resistance and obesity.   Depression with Anxiety Theresa Mathews's mood is up and down. Her parents were recently diagnosed  with COVID but seem to be doing ok. She reports occasional stress eating when overly tired. She has been working on behavior modification techniques to help reduce her emotional eating and has been somewhat successful.   ASSESSMENT AND PLAN:  Vitamin D deficiency - Plan: Cholecalciferol (VITAMIN D3) 1.25 MG (50000 UT) CAPS  Essential hypertension  Insulin resistance - Plan: metFORMIN (GLUCOPHAGE) 500 MG tablet  Depression with anxiety - Plan: buPROPion (WELLBUTRIN SR) 200 MG 12 hr tablet  At risk for diabetes mellitus  Class 2 severe obesity with serious comorbidity and body mass index (BMI) of 38.0 to 38.9 in adult, unspecified obesity type ([place])  PLAN:  Vitamin D Deficiency Roda was informed that low vitamin D levels contribute to fatigue and are associated with obesity, breast, and colon cancer. Angelyna agrees to continue to take prescription Vit D @50 ,000 IU, 2 times per week #10 with no refills and will follow up for routine testing of vitamin D, at least 2-3 times per year. She was informed of the risk of over-replacement of vitamin D and agrees to not increase her dose unless she discusses this with [name] first. Karcyn agrees to follow up in 2 weeks as directed.  Hypertension We discussed sodium restriction, working on healthy weight loss, and a regular exercise program as the means to achieve improved blood pressure control. We will continue to monitor her blood pressure as well as her progress with the above lifestyle modifications. She agrees to work on compliance with Micardis. We discussed strategies  to improve compliance.  Marissah agreed with this plan and agreed to follow up as directed.  Insulin Resistance Mikala will continue to work on weight loss, exercise, and decreasing simple carbohydrates in her diet to help decrease the risk of diabetes. We discussed metformin including benefits and risks. Tattiana agreed to continue metformin 500 mg qd #30 with no refills and prescription  was written today. Chellsey agreed to follow up with [name] as directed to monitor her progress.   Diabetes risk counseling Deborah was given extended (15 minutes) diabetes prevention counseling today. She is 53 y.o. female and has risk factors for diabetes including insulin resistance and obesity. We discussed intensive lifestyle modifications today with an emphasis on weight loss as well as increasing exercise and decreasing simple carbohydrates in her diet.  Depression with Anxiety We discussed behavior modification techniques today to help Theresa Mathews deal with her emotional eating and depression. She has agreed to take bupropion 200 mg BID #60 with no refills and agreed to follow up as directed.  Obesity Theresa Mathews is currently in the action stage of change. As such, her goal is to continue with weight loss efforts.  She has agreed to follow the Pescatarian plan. Theresa Mathews has been instructed to work up to a goal of 150 minutes of combined cardio and strengthening exercise per week for weight loss and overall health benefits. We discussed the following Behavioral Modification Strategies today: work on meal planning and easy cooking plans and planning for success.  Theresa Mathews has agreed to follow up with our clinic in 2 weeks. She was informed of the importance of frequent follow up visits to maximize her success with intensive lifestyle modifications for her multiple health conditions.  ALLERGIES: No Known Allergies  MEDICATIONS: Current Outpatient Medications on File Prior to Visit  Medication Sig Dispense Refill  . citalopram (CELEXA) 20 MG tablet Take 1 tablet (20 mg total) by mouth daily. 90 tablet 0  . ranitidine (ZANTAC) 75 MG tablet Take 75 mg by mouth daily at 6 (six) AM.     . telmisartan (MICARDIS) 80 MG tablet Take 1 tablet (80 mg total) by mouth daily. Take one daily 90 tablet 0   Current Facility-Administered Medications on File Prior to Visit  Medication Dose Route Frequency Provider Last  Rate Last Dose  . 0.9 %  sodium chloride infusion  500 mL Intravenous Once [name], MD        PAST MEDICAL HISTORY: Past Medical History:  Diagnosis Date  . Anxiety   . Back pain   . Depression   . Fatigue   . GERD (gastroesophageal reflux disease)   . Lactose intolerance     PAST SURGICAL HISTORY: Past Surgical History:  Procedure Laterality Date  . BLADDER SUSPENSION N/A 01/23/2015   Procedure: TRANSVAGINAL TAPE (TVT) PROCEDURE;  Surgeon: Bobbye Charleston, MD;  Location: [facility];  Service: Gynecology;  Laterality: N/A;  . CYSTOSCOPY N/A 01/23/2015   Procedure: CYSTOSCOPY;  Surgeon: Bobbye Charleston, MD;  Location: [facility];  Service: Gynecology;  Laterality: N/A;  . DILATION AND CURETTAGE OF UTERUS  2005  . ROBOTIC ASSISTED TOTAL HYSTERECTOMY WITH BILATERAL SALPINGO OOPHERECTOMY Bilateral 01/23/2015   Procedure: ROBOTIC ASSISTED TOTAL HYSTERECTOMY WITH BILATERAL SALPINGO OOPHORECTOMY;  Surgeon: Bobbye Charleston, MD;  Location: [facility];  Service: Gynecology;  Laterality: Bilateral;    SOCIAL HISTORY: Social History   Tobacco Use  . Smoking status: Former Smoker    Packs/day: 0.50    Years: 4.00    Pack years: 2.00  .  Smokeless tobacco: Never Used  . Tobacco comment: smoked in college  Substance Use Topics  . Alcohol use: Yes    Alcohol/week: 5.0 standard drinks    Types: 5 Glasses of wine per week  . Drug use: No    FAMILY HISTORY: Family History  Problem Relation Age of Onset  . Hypertension Mother   . Ovarian cancer Mother   . Heart disease Mother 21       a fib  . Obesity Mother   . Hypertension Father   . Diabetes Father   . Hyperlipidemia Father   . Obesity Father     ROS: Review of Systems  Constitutional: Positive for weight loss.  Respiratory: Negative for shortness of breath.   Cardiovascular: Negative for chest pain.  Gastrointestinal: Negative for nausea and vomiting.  Musculoskeletal:       Negative for muscle weakness.   Endo/Heme/Allergies:       Negative for polyphagia.  Psychiatric/Behavioral: Positive for depression. The patient is nervous/anxious.     PHYSICAL EXAM: Blood pressure (!) 155/83, pulse 60, temperature 97.9 F (36.6 C), temperature source Oral, height 5\' 4"  (1.626 m), weight 221 lb (100.2 kg), SpO2 100 %. Body mass index is 37.93 kg/m. Physical Exam Vitals signs reviewed.  Constitutional:      Appearance: Normal appearance. She is obese.  Cardiovascular:     Rate and Rhythm: Normal rate.  Pulmonary:     Effort: Pulmonary effort is normal.  Musculoskeletal: Normal range of motion.  Skin:    General: Skin is warm and dry.  Neurological:     Mental Status: She is alert and oriented to person, place, and time.  Psychiatric:        Mood and Affect: Mood normal.        Behavior: Behavior normal.     RECENT LABS AND TESTS: BMET    Component Value Date/Time   NA 138 02/02/2019 1521   K 4.7 02/02/2019 1521   CL 101 02/02/2019 1521   CO2 24 02/02/2019 1521   GLUCOSE 90 02/02/2019 1521   GLUCOSE 97 07/12/2017 1011   BUN 17 02/02/2019 1521   CREATININE 0.99 02/02/2019 1521   CALCIUM 9.4 02/02/2019 1521   GFRNONAA 66 02/02/2019 1521   GFRAA 76 02/02/2019 1521   Lab Results  Component Value Date   HGBA1C 5.4 02/02/2019   HGBA1C 5.4 10/03/2018   HGBA1C 5.4 03/14/2018   HGBA1C 5.4 11/24/2017   HGBA1C 5.3 08/26/2017   Lab Results  Component Value Date   INSULIN 7.0 02/02/2019   INSULIN 9.5 03/14/2018   INSULIN 11.2 11/24/2017   INSULIN 9.4 08/26/2017   CBC    Component Value Date/Time   WBC 8.5 07/12/2017 1011   RBC 4.47 07/12/2017 1011   HGB 13.7 07/12/2017 1011   HCT 39.8 07/12/2017 1011   PLT 383.0 07/12/2017 1011   MCV 88.9 07/12/2017 1011   MCH 31.0 01/08/2015 0840   MCHC 34.5 07/12/2017 1011   RDW 13.7 07/12/2017 1011   LYMPHSABS 2.1 07/12/2017 1011   MONOABS 0.5 07/12/2017 1011   EOSABS 0.2 07/12/2017 1011   BASOSABS 0.1 07/12/2017 1011    Iron/TIBC/Ferritin/ %Sat No results found for: IRON, TIBC, FERRITIN, IRONPCTSAT Lipid Panel     Component Value Date/Time   CHOL 201 (H) 02/02/2019 1521   TRIG 94 02/02/2019 1521   HDL 56 02/02/2019 1521   CHOLHDL 4.5 (H) 10/03/2018 0932   CHOLHDL 4 07/12/2017 1011   VLDL 21.2 07/12/2017 1011  LDLCALC 128 (H) 02/02/2019 1521   Hepatic Function Panel     Component Value Date/Time   PROT 6.8 02/02/2019 1521   ALBUMIN 4.4 02/02/2019 1521   AST 17 02/02/2019 1521   ALT 14 02/02/2019 1521   ALKPHOS 97 02/02/2019 1521   BILITOT 0.4 02/02/2019 1521   BILIDIR 0.0 02/05/2011 0815      Component Value Date/Time   TSH 1.290 08/26/2017 0918   TSH 1.27 07/12/2017 1011   TSH 1.27 02/05/2011 0815   Results for Theresa Mathews, ROYLANCE (MRN [id number]) as of 02/27/2019 12:09  Ref. Range 02/02/2019 15:21  Vitamin D, 25-Hydroxy Latest Ref Range: 30.0 - 100.0 ng/mL 41.4   OBESITY BEHAVIORAL INTERVENTION VISIT  Today's visit was # 28   Starting weight: 220 lbs Starting date: 08/26/2017 Today's weight : Weight: 221 lb (100.2 kg)  Today's date: 02/27/2019 Total lbs lost to date: 0    02/27/2019  Height 5\' 4"  (1.626 m)  Weight 221 lb (100.2 kg)  BMI (Calculated) 37.92  BLOOD PRESSURE - SYSTOLIC 99991111  BLOOD PRESSURE - DIASTOLIC 83   Body Fat % 123456 %  Total Body Water (lbs) 84.2 lbs   ASK: We discussed the diagnosis of obesity with Jannette Fogo today and Alencia agreed to give [name] permission to discuss obesity behavioral modification therapy today.  ASSESS: Chanti has the diagnosis of obesity and her BMI today is 37.92. Deserie is in the action stage of change.   ADVISE: Rakya was educated on the multiple health risks of obesity as well as the benefit of weight loss to improve her health. She was advised of the need for long term treatment and the importance of lifestyle modifications to improve her current health and to decrease her risk of future health problems.  AGREE: Multiple  dietary modification options and treatment options were discussed and Nyia agreed to follow the recommendations documented in the above note.  ARRANGE: Bernadette was educated on the importance of frequent visits to treat obesity as outlined per CMS and USPSTF guidelines and agreed to schedule her next follow up appointment today.  Lenward Chancellor, CMA, am acting as Location manager for [employer], FNP-C.  I have reviewed the above documentation for accuracy and completeness, and I agree with the above.  - Alvie Fowles, FNP-C.

## 2019-02-28 ENCOUNTER — Encounter (INDEPENDENT_AMBULATORY_CARE_PROVIDER_SITE_OTHER): Payer: Self-pay | Admitting: Family Medicine

## 2019-03-03 ENCOUNTER — Encounter (INDEPENDENT_AMBULATORY_CARE_PROVIDER_SITE_OTHER): Payer: Self-pay | Admitting: Family Medicine

## 2019-03-03 DIAGNOSIS — F418 Other specified anxiety disorders: Secondary | ICD-10-CM

## 2019-03-03 DIAGNOSIS — E8881 Metabolic syndrome: Secondary | ICD-10-CM

## 2019-03-06 MED ORDER — METFORMIN HCL 500 MG PO TABS: 500.0000 mg | ORAL_TABLET | Freq: Every day | ORAL | 0 refills | Status: DC

## 2019-03-06 MED ORDER — BUPROPION HCL ER (SR) 200 MG PO TB12: 200.0000 mg | ORAL_TABLET | Freq: Two times a day (BID) | ORAL | 0 refills | Status: DC

## 2019-03-16 ENCOUNTER — Encounter (INDEPENDENT_AMBULATORY_CARE_PROVIDER_SITE_OTHER): Payer: Self-pay | Admitting: Family Medicine

## 2019-03-16 ENCOUNTER — Other Ambulatory Visit: Payer: Self-pay

## 2019-03-16 ENCOUNTER — Ambulatory Visit (INDEPENDENT_AMBULATORY_CARE_PROVIDER_SITE_OTHER): Payer: BC Managed Care – PPO | Admitting: Family Medicine

## 2019-03-16 VITALS — BP 123/80 | HR 69 | Temp 98.1°F | Ht 64.0 in | Wt 218.0 lb

## 2019-03-16 DIAGNOSIS — Z6837 Body mass index (BMI) 37.0-37.9, adult: Secondary | ICD-10-CM

## 2019-03-16 DIAGNOSIS — Z9189 Other specified personal risk factors, not elsewhere classified: Secondary | ICD-10-CM | POA: Diagnosis not present

## 2019-03-16 DIAGNOSIS — I10 Essential (primary) hypertension: Secondary | ICD-10-CM | POA: Diagnosis not present

## 2019-03-16 DIAGNOSIS — F3289 Other specified depressive episodes: Secondary | ICD-10-CM

## 2019-03-16 MED ORDER — TELMISARTAN 80 MG PO TABS: 80.0000 mg | ORAL_TABLET | Freq: Every day | ORAL | 0 refills | Status: DC

## 2019-03-20 NOTE — Progress Notes (Signed)
Office: [phone]  /  Fax: [phone]   HPI:  Chief Complaint: OBESITY Theresa Mathews is here to discuss her progress with her obesity treatment plan. She is on the Pescatarian eating plan and states she is following her eating plan approximately 75 % of the time. She states she is walking 30 minutes 2 times per week.  Theresa Mathews feels she is doing better on the plan. She reports that her appetite has been better controlled over the last few weeks. She has been more compliant with all of her medications.  Hypertension Theresa Mathews is a 53 y.o. female with hypertension. Her blood pressure is improved today. She reports better compliance with medications. Theresa Mathews denies chest pain or shortness of breath.  BP Readings from Last 3 Encounters:  03/16/19 123/80  02/27/19 (!) 155/83  02/02/19 125/82    At risk for cardiovascular disease Theresa Mathews is at a higher than average risk for cardiovascular disease due to obesity and hypertension. She currently denies any chest pain.  Other Depression with emotional eating behaviors Theresa Mathews struggles with emotional eating. She reports cravings for chocolate after eating. Her mood is stable and she feels Bupropion helps with this. She reports feeling more in control of her eating the past few weeks.     Today's visit was # 7  Starting weight: 220 lbs Starting date: 08/26/2017 Today's weight : 218 lbs  Today's date: 03/16/2019 Total lbs lost to date: 2 Total lbs lost since last in-office visit: 3  ASSESSMENT AND PLAN:  Essential hypertension - Plan: telmisartan (MICARDIS) 80 MG tablet  Other depression, emotional eating  At risk for heart disease  Class 2 severe obesity with serious comorbidity and body mass index (BMI) of 37.0 to 37.9 in adult, unspecified obesity type ([place])  PLAN:  Hypertension Theresa Mathews is working on healthy weight loss and exercise to improve blood pressure control. We will watch for signs of hypotension as she continues her  lifestyle modifications. Theresa Mathews agrees to continue Micardis 80 mg daily #30 with no refills and follow up as directed.  Cardiovascular risk counseling Theresa Mathews was given (~15 minutes) coronary artery disease prevention counseling today. She is 53 y.o. female and has risk factors for heart disease including obesity and hypertension. We discussed intensive lifestyle modifications today with an emphasis on specific weight loss instructions and strategies.   Emotional Eating Behaviors (other depression) We discussed substituting tea or coffee with zero calorie sweetener for chocolate. Theresa Mathews will continue Bupropion and we will continue to follow and monitor her progress.  Obesity Theresa Mathews is currently in the action stage of change. As such, her goal is to continue with weight loss efforts She has agreed to follow the Pescatarian eating plan Theresa Mathews will increase her walking frequency for weight loss and overall health benefits. We discussed the following Behavioral Modification Strategies today: planning for success and emotional eating strategies  Theresa Mathews has agreed to follow up with our clinic in 4 weeks. She was informed of the importance of frequent follow up visits to maximize her success with intensive lifestyle modifications for her multiple health conditions.  ALLERGIES: No Known Allergies  MEDICATIONS: Current Outpatient Medications on File Prior to Visit  Medication Sig Dispense Refill  . buPROPion (WELLBUTRIN SR) 200 MG 12 hr tablet Take 1 tablet (200 mg total) by mouth 2 (two) times daily. 180 tablet 0  . Cholecalciferol (VITAMIN D3) 1.25 MG (50000 UT) CAPS Take 1 capsule by mouth 2 (two) times a week. 10 capsule 0  . citalopram (CELEXA)  20 MG tablet Take 1 tablet (20 mg total) by mouth daily. 90 tablet 0  . metFORMIN (GLUCOPHAGE) 500 MG tablet Take 1 tablet (500 mg total) by mouth daily with breakfast. 90 tablet 0  . ranitidine (ZANTAC) 75 MG tablet Take 75 mg by mouth daily at 6 (six)  AM.      Current Facility-Administered Medications on File Prior to Visit  Medication Dose Route Frequency Provider Last Rate Last Admin  . 0.9 %  sodium chloride infusion  500 mL Intravenous Once [name], MD        PAST MEDICAL HISTORY: Past Medical History:  Diagnosis Date  . Anxiety   . Back pain   . Depression   . Fatigue   . GERD (gastroesophageal reflux disease)   . Lactose intolerance     PAST SURGICAL HISTORY: Past Surgical History:  Procedure Laterality Date  . BLADDER SUSPENSION N/A 01/23/2015   Procedure: TRANSVAGINAL TAPE (TVT) PROCEDURE;  Surgeon: Bobbye Charleston, MD;  Location: [facility];  Service: Gynecology;  Laterality: N/A;  . CYSTOSCOPY N/A 01/23/2015   Procedure: CYSTOSCOPY;  Surgeon: Bobbye Charleston, MD;  Location: [facility];  Service: Gynecology;  Laterality: N/A;  . DILATION AND CURETTAGE OF UTERUS  2005  . ROBOTIC ASSISTED TOTAL HYSTERECTOMY WITH BILATERAL SALPINGO OOPHERECTOMY Bilateral 01/23/2015   Procedure: ROBOTIC ASSISTED TOTAL HYSTERECTOMY WITH BILATERAL SALPINGO OOPHORECTOMY;  Surgeon: Bobbye Charleston, MD;  Location: [facility];  Service: Gynecology;  Laterality: Bilateral;    SOCIAL HISTORY: Social History   Tobacco Use  . Smoking status: Former Smoker    Packs/day: 0.50    Years: 4.00    Pack years: 2.00  . Smokeless tobacco: Never Used  . Tobacco comment: smoked in college  Substance Use Topics  . Alcohol use: Yes    Alcohol/week: 5.0 standard drinks    Types: 5 Glasses of wine per week  . Drug use: No    FAMILY HISTORY: Family History  Problem Relation Age of Onset  . Hypertension Mother   . Ovarian cancer Mother   . Heart disease Mother 61       a fib  . Obesity Mother   . Hypertension Father   . Diabetes Father   . Hyperlipidemia Father   . Obesity Father     ROS: Review of Systems  Constitutional: Positive for weight loss.  Respiratory: Negative for shortness of breath.   Cardiovascular: Negative for chest pain.    Endo/Heme/Allergies:       Positive for cravings  Psychiatric/Behavioral: Positive for depression.    PHYSICAL EXAM: Blood pressure 123/80, pulse 69, temperature 98.1 F (36.7 C), temperature source Oral, height 5\' 4"  (1.626 m), weight 218 lb (98.9 kg), SpO2 98 %. Body mass index is 37.42 kg/m. Physical Exam Vitals reviewed.  Constitutional:      General: She is not in acute distress.    Appearance: Normal appearance. She is well-developed. She is obese.  Cardiovascular:     Rate and Rhythm: Normal rate.  Pulmonary:     Effort: Pulmonary effort is normal.  Musculoskeletal:        General: Normal range of motion.  Skin:    General: Skin is warm and dry.  Neurological:     Mental Status: She is alert and oriented to person, place, and time.  Psychiatric:        Mood and Affect: Mood normal.        Behavior: Behavior normal.     RECENT LABS AND TESTS: BMET  Component Value Date/Time   NA 138 02/02/2019 1521   K 4.7 02/02/2019 1521   CL 101 02/02/2019 1521   CO2 24 02/02/2019 1521   GLUCOSE 90 02/02/2019 1521   GLUCOSE 97 07/12/2017 1011   BUN 17 02/02/2019 1521   CREATININE 0.99 02/02/2019 1521   CALCIUM 9.4 02/02/2019 1521   GFRNONAA 66 02/02/2019 1521   GFRAA 76 02/02/2019 1521   Lab Results  Component Value Date   HGBA1C 5.4 02/02/2019   HGBA1C 5.4 10/03/2018   HGBA1C 5.4 03/14/2018   HGBA1C 5.4 11/24/2017   HGBA1C 5.3 08/26/2017   Lab Results  Component Value Date   INSULIN 7.0 02/02/2019   INSULIN 9.5 03/14/2018   INSULIN 11.2 11/24/2017   INSULIN 9.4 08/26/2017   CBC    Component Value Date/Time   WBC 8.5 07/12/2017 1011   RBC 4.47 07/12/2017 1011   HGB 13.7 07/12/2017 1011   HCT 39.8 07/12/2017 1011   PLT 383.0 07/12/2017 1011   MCV 88.9 07/12/2017 1011   MCH 31.0 01/08/2015 0840   MCHC 34.5 07/12/2017 1011   RDW 13.7 07/12/2017 1011   LYMPHSABS 2.1 07/12/2017 1011   MONOABS 0.5 07/12/2017 1011   EOSABS 0.2 07/12/2017 1011    BASOSABS 0.1 07/12/2017 1011   Iron/TIBC/Ferritin/ %Sat No results found for: IRON, TIBC, FERRITIN, IRONPCTSAT Lipid Panel     Component Value Date/Time   CHOL 201 (H) 02/02/2019 1521   TRIG 94 02/02/2019 1521   HDL 56 02/02/2019 1521   CHOLHDL 4.5 (H) 10/03/2018 0932   CHOLHDL 4 07/12/2017 1011   VLDL 21.2 07/12/2017 1011   LDLCALC 128 (H) 02/02/2019 1521   Hepatic Function Panel     Component Value Date/Time   PROT 6.8 02/02/2019 1521   ALBUMIN 4.4 02/02/2019 1521   AST 17 02/02/2019 1521   ALT 14 02/02/2019 1521   ALKPHOS 97 02/02/2019 1521   BILITOT 0.4 02/02/2019 1521   BILIDIR 0.0 02/05/2011 0815      Component Value Date/Time   TSH 1.290 08/26/2017 0918   TSH 1.27 07/12/2017 1011   TSH 1.27 02/05/2011 0815     Ref. Range 02/02/2019 15:21  Vitamin D, 25-Hydroxy Latest Ref Range: 30.0 - 100.0 ng/mL 41.4    I, Doreene Nest, am acting as Location manager for [employer], FNP-C I have reviewed the above documentation for accuracy and completeness, and I agree with the above.  - Matyas Baisley, FNP-C.

## 2019-03-26 ENCOUNTER — Encounter (INDEPENDENT_AMBULATORY_CARE_PROVIDER_SITE_OTHER): Payer: Self-pay | Admitting: Family Medicine

## 2019-04-11 ENCOUNTER — Encounter (INDEPENDENT_AMBULATORY_CARE_PROVIDER_SITE_OTHER): Payer: Self-pay | Admitting: Family Medicine

## 2019-04-11 ENCOUNTER — Ambulatory Visit (INDEPENDENT_AMBULATORY_CARE_PROVIDER_SITE_OTHER): Payer: BC Managed Care – PPO | Admitting: Family Medicine

## 2019-04-11 ENCOUNTER — Other Ambulatory Visit: Payer: Self-pay

## 2019-04-11 VITALS — BP 129/80 | HR 65 | Temp 97.7°F | Ht 64.0 in | Wt 222.0 lb

## 2019-04-11 DIAGNOSIS — E559 Vitamin D deficiency, unspecified: Secondary | ICD-10-CM

## 2019-04-11 DIAGNOSIS — Z6838 Body mass index (BMI) 38.0-38.9, adult: Secondary | ICD-10-CM

## 2019-04-11 DIAGNOSIS — F3289 Other specified depressive episodes: Secondary | ICD-10-CM | POA: Diagnosis not present

## 2019-04-11 MED ORDER — VITAMIN D3 1.25 MG (50000 UT) PO CAPS: 1.0000 | ORAL_CAPSULE | ORAL | 0 refills | Status: DC

## 2019-04-12 NOTE — Progress Notes (Signed)
Chief Complaint:   OBESITY Theresa Mathews is here to discuss her progress with her obesity treatment plan along with follow-up of her obesity related diagnoses. Theresa Mathews is on the [employer] and states she is following her eating plan approximately 60% of the time. Theresa Mathews states she is walking 20 minutes 2 times per week.  Today's visit was #: 30 Starting weight: 220 lbs Starting date: 08/26/2017 Today's weight: 222 lbs Today's date: 04/11/2019 Total lbs lost to date: 0 Total lbs lost since last in-office visit: 0  Interim History: Theresa Mathews reports being off of work over the holidays which promotes her being off the plan. Her meal planning was disrupted and there were more opportunities to eat off plan food. She is now back to meal planning.  Subjective:   Vitamin D deficiency  Theresa Mathews's vitamin D level is 41.4 on 02/02/19. She is on prescription vitamin D two times a week.  Other depression, emotional eating Theresa Mathews reports that stress eating has been like a "roller coaster". She does admit to eating more due to stress. Work has been very stressful for her. She reports occasional feeling of depression and anxiety. She is not sleeping enough.  Assessment/Plan:   Vitamin D deficiency Low Vitamin D level contributes to fatigue and are associated with obesity, breast, and colon cancer. Theresa Mathews agrees to continue to take prescription Vitamin D @50 ,000 IU every 3 days #10 with no refills and she will follow-up for routine testing of vitamin D, at least 2-3 times per year to avoid over-replacement.  Other depression, emotional eating We discussed self care and sleep hygiene today. Orders and follow up as documented in patient record.   Obesity Theresa Mathews is currently in the action stage of change. As such, her goal is to continue with weight loss efforts. She has agreed to on the [employer].   We discussed the following exercise goals today:will continue current exercise regimen  for weight loss and overall health benefits.  We discussed the following behavioral modification strategies today: meal planning and cooking strategies, emotional eating strategies and planning for success.  Theresa Mathews has agreed to follow-up with our clinic in 3 weeks. She was informed of the importance of frequent follow-up visits to maximize her success with intensive lifestyle modifications for her multiple health conditions.   Objective:   Blood pressure 129/80, pulse 65, temperature 97.7 F (36.5 C), temperature source Oral, height 5\' 4"  (1.626 m), weight 222 lb (100.7 kg), SpO2 98 %. Body mass index is 38.11 kg/m.  General: Cooperative, alert, well developed, in no acute distress. HEENT: Conjunctivae and lids unremarkable. Neck: No thyromegaly.  Cardiovascular: Regular rhythm.  Lungs: Normal work of breathing. Extremities: No edema.  Neurologic: No focal deficits.   Lab Results  Component Value Date   CREATININE 0.99 02/02/2019   BUN 17 02/02/2019   NA 138 02/02/2019   K 4.7 02/02/2019   CL 101 02/02/2019   CO2 24 02/02/2019   Lab Results  Component Value Date   ALT 14 02/02/2019   AST 17 02/02/2019   ALKPHOS 97 02/02/2019   BILITOT 0.4 02/02/2019   Lab Results  Component Value Date   HGBA1C 5.4 02/02/2019   HGBA1C 5.4 10/03/2018   HGBA1C 5.4 03/14/2018   HGBA1C 5.4 11/24/2017   HGBA1C 5.3 08/26/2017   Lab Results  Component Value Date   INSULIN 7.0 02/02/2019   INSULIN 9.5 03/14/2018   INSULIN 11.2 11/24/2017   INSULIN 9.4 08/26/2017  Lab Results  Component Value Date   TSH 1.290 08/26/2017   Lab Results  Component Value Date   CHOL 201 (H) 02/02/2019   HDL 56 02/02/2019   LDLCALC 128 (H) 02/02/2019   TRIG 94 02/02/2019   CHOLHDL 4.5 (H) 10/03/2018   Lab Results  Component Value Date   WBC 8.5 07/12/2017   HGB 13.7 07/12/2017   HCT 39.8 07/12/2017   MCV 88.9 07/12/2017   PLT 383.0 07/12/2017   No results found for: IRON, TIBC,  FERRITIN   Ref. Range 02/02/2019 15:21  Vitamin D, 25-Hydroxy Latest Ref Range: 30.0 - 100.0 ng/mL 41.4    Attestation Statements:   Reviewed by clinician on day of visit: allergies, medications, problem list, medical history, surgical history, family history, social history, and previous encounter notes.  Corey Skains, am acting as Location manager for [employer], FNP-C.  I have reviewed the above documentation for accuracy and completeness, and I agree with the above. -  Theresa Fick, FNP

## 2019-04-13 ENCOUNTER — Encounter (INDEPENDENT_AMBULATORY_CARE_PROVIDER_SITE_OTHER): Payer: Self-pay | Admitting: Family Medicine

## 2019-04-21 DIAGNOSIS — S335XXA Sprain of ligaments of lumbar spine, initial encounter: Secondary | ICD-10-CM | POA: Diagnosis not present

## 2019-04-21 DIAGNOSIS — M545 Low back pain: Secondary | ICD-10-CM | POA: Diagnosis not present

## 2019-05-03 ENCOUNTER — Encounter (INDEPENDENT_AMBULATORY_CARE_PROVIDER_SITE_OTHER): Payer: Self-pay | Admitting: Family Medicine

## 2019-05-03 ENCOUNTER — Other Ambulatory Visit: Payer: Self-pay

## 2019-05-03 ENCOUNTER — Ambulatory Visit (INDEPENDENT_AMBULATORY_CARE_PROVIDER_SITE_OTHER): Payer: BC Managed Care – PPO | Admitting: Family Medicine

## 2019-05-03 VITALS — BP 132/81 | HR 75 | Temp 98.1°F | Ht 64.0 in | Wt 220.0 lb

## 2019-05-03 DIAGNOSIS — Z9189 Other specified personal risk factors, not elsewhere classified: Secondary | ICD-10-CM | POA: Diagnosis not present

## 2019-05-03 DIAGNOSIS — I10 Essential (primary) hypertension: Secondary | ICD-10-CM

## 2019-05-03 DIAGNOSIS — E559 Vitamin D deficiency, unspecified: Secondary | ICD-10-CM | POA: Diagnosis not present

## 2019-05-03 DIAGNOSIS — Z6837 Body mass index (BMI) 37.0-37.9, adult: Secondary | ICD-10-CM

## 2019-05-03 MED ORDER — TELMISARTAN 80 MG PO TABS: 80.0000 mg | ORAL_TABLET | Freq: Every day | ORAL | 0 refills | Status: DC

## 2019-05-03 MED ORDER — VITAMIN D3 1.25 MG (50000 UT) PO CAPS: 1.0000 | ORAL_CAPSULE | ORAL | 0 refills | Status: DC

## 2019-05-03 NOTE — Progress Notes (Signed)
Chief Complaint:   OBESITY Theresa Mathews is here to discuss her progress with her obesity treatment plan along with follow-up of her obesity related diagnoses. Theresa Mathews is on the [employer] and states she is following her eating plan approximately 60% of the time. [name] states she is stair climbing 20 minutes 5 times per week.  Today's visit was #: 63 Starting weight: 220 lbs Starting date: 08/26/2017 Today's weight: 220 lbs Today's date: 05/03/2019 Total lbs lost to date: 0 Total lbs lost since last in-office visit: 2  Interim History: Theresa Mathews is struggling with lunch options. She is tired of sandwiches and microwave meals. She struggles with hunger after lunch. [name] would like to switch meal plans.  Subjective:   Vitamin D deficiency Theresa Mathews's last Vitamin D level was 41.4 on 02/02/19 and was not at goal. She is on prescription vit D every 3 days.  Essential hypertension  Theresa Mathews's hypertension is well controlled with Losartan. She is doing better with compliance. She has implemented strategies that we discussed previously to improve compliance.  BP Readings from Last 3 Encounters:  05/03/19 132/81  04/11/19 129/80  03/16/19 123/80   Lab Results  Component Value Date   CREATININE 0.99 02/02/2019   CREATININE 0.83 10/03/2018   CREATININE 0.90 03/14/2018   At risk for heart disease Theresa Mathews is at a higher than average risk for cardiovascular disease due to obesity, hyperlipidemia, and hypertension.   Assessment/Plan:   Vitamin D deficiency  Low Vitamin D level contributes to fatigue and are associated with obesity, breast, and colon cancer. Theresa Mathews agrees to continue to take prescription Vitamin D @50 ,000 IU every 3 days #10 with no refills and she will follow-up for routine testing of Vitamin D, at least 2-3 times per year to avoid over-replacement.  Essential hypertension  Theresa Mathews is working on healthy weight loss and exercise to improve blood pressure control.  [name] agrees to take telmisartan (MICARDIS) 80 MG tablet daily #30 with no refills. We will watch for signs of hypotension as she continues her lifestyle modifications.  At risk for heart disease Theresa Mathews was given approximately 15 minutes of coronary artery disease prevention counseling today. She is 54 y.o. female and has risk factors for heart disease including obesity and hypertension. We discussed intensive lifestyle modifications today with an emphasis on specific weight loss instructions and strategies.   Repetitive spaced learning was employed today to elicit superior memory formation and behavioral change.  Class 2 severe obesity with serious comorbidity and body mass index (BMI) of 37.0 to 37.9 in adult, unspecified obesity type Theresa Mathews) Theresa Mathews is currently in the action stage of change. As such, her goal is to continue with weight loss efforts. She has agreed to the Category 2 Plan and keeping a food journal and adhering to recommended goals of 300 to 400 calories and 30 grams of protein at lunch daily.   Exercise goals: Theresa Mathews will continue her current exercise regimen.  Behavioral modification strategies: meal planning and cooking strategies.  Theresa Mathews has agreed to follow-up with our clinic in 3 weeks. She was informed of the importance of frequent follow-up visits to maximize her success with intensive lifestyle modifications for her multiple health conditions.   Objective:   Blood pressure 132/81, pulse 75, temperature 98.1 F (36.7 C), temperature source Oral, height 5\' 4"  (1.626 m), weight 220 lb (99.8 kg), SpO2 99 %. Body mass index is 37.76 kg/m.  General: Cooperative, alert, well developed, in no acute distress. HEENT: Conjunctivae and  lids unremarkable. Cardiovascular: Regular rhythm.  Lungs: Normal work of breathing. Neurologic: No focal deficits.   Lab Results  Component Value Date   CREATININE 0.99 02/02/2019   BUN 17 02/02/2019   NA 138 02/02/2019   K 4.7  02/02/2019   CL 101 02/02/2019   CO2 24 02/02/2019   Lab Results  Component Value Date   ALT 14 02/02/2019   AST 17 02/02/2019   ALKPHOS 97 02/02/2019   BILITOT 0.4 02/02/2019   Lab Results  Component Value Date   HGBA1C 5.4 02/02/2019   HGBA1C 5.4 10/03/2018   HGBA1C 5.4 03/14/2018   HGBA1C 5.4 11/24/2017   HGBA1C 5.3 08/26/2017   Lab Results  Component Value Date   INSULIN 7.0 02/02/2019   INSULIN 9.5 03/14/2018   INSULIN 11.2 11/24/2017   INSULIN 9.4 08/26/2017   Lab Results  Component Value Date   TSH 1.290 08/26/2017   Lab Results  Component Value Date   CHOL 201 (H) 02/02/2019   HDL 56 02/02/2019   LDLCALC 128 (H) 02/02/2019   TRIG 94 02/02/2019   CHOLHDL 4.5 (H) 10/03/2018   Lab Results  Component Value Date   WBC 8.5 07/12/2017   HGB 13.7 07/12/2017   HCT 39.8 07/12/2017   MCV 88.9 07/12/2017   PLT 383.0 07/12/2017   No results found for: IRON, TIBC, FERRITIN   Ref. Range 02/02/2019 15:21  Vitamin D, 25-Hydroxy Latest Ref Range: 30.0 - 100.0 ng/mL 41.4    Attestation Statements:   Reviewed by clinician on day of visit: allergies, medications, problem list, medical history, surgical history, family history, social history, and previous encounter notes.  Corey Skains, am acting as Location manager for [employer], FNP-C.  I have reviewed the above documentation for accuracy and completeness, and I agree with the above. -  Bernadette Gores [employer], FNP-C

## 2019-05-04 ENCOUNTER — Encounter (INDEPENDENT_AMBULATORY_CARE_PROVIDER_SITE_OTHER): Payer: Self-pay | Admitting: Family Medicine

## 2019-05-07 ENCOUNTER — Other Ambulatory Visit (INDEPENDENT_AMBULATORY_CARE_PROVIDER_SITE_OTHER): Payer: Self-pay | Admitting: Family Medicine

## 2019-05-07 DIAGNOSIS — E559 Vitamin D deficiency, unspecified: Secondary | ICD-10-CM

## 2019-05-24 ENCOUNTER — Ambulatory Visit (INDEPENDENT_AMBULATORY_CARE_PROVIDER_SITE_OTHER): Payer: BC Managed Care – PPO | Admitting: Family Medicine

## 2019-05-24 ENCOUNTER — Other Ambulatory Visit: Payer: Self-pay

## 2019-05-24 VITALS — BP 131/83 | HR 80 | Temp 98.3°F | Ht 64.0 in | Wt 222.0 lb

## 2019-05-24 DIAGNOSIS — E559 Vitamin D deficiency, unspecified: Secondary | ICD-10-CM

## 2019-05-24 DIAGNOSIS — Z6837 Body mass index (BMI) 37.0-37.9, adult: Secondary | ICD-10-CM | POA: Diagnosis not present

## 2019-05-24 DIAGNOSIS — F3289 Other specified depressive episodes: Secondary | ICD-10-CM

## 2019-05-24 MED ORDER — BUPROPION HCL ER (SR) 200 MG PO TB12: 200.0000 mg | ORAL_TABLET | Freq: Two times a day (BID) | ORAL | 0 refills | Status: DC

## 2019-05-24 NOTE — Progress Notes (Signed)
Chief Complaint:   OBESITY Theresa Mathews is here to discuss her progress with her obesity treatment plan along with follow-up of her obesity related diagnoses. Theresa Mathews is on the Category 3 Plan and keeping a food journal and adhering to recommended goals of 300-400 calories and 30 grams of protein at lunch daily and states she is following her eating plan approximately 70% of the time. Theresa Mathews states she is walking for 25-30 minutes 2 times per week.  Today's visit was #: 31 Starting weight: 220 lbs Starting date: 08/26/17 Today's weight: 222 lbs Today's date: 05/24/2019 Total lbs lost to date: 0 Total lbs lost since last in-office visit: 0  Interim History: Theresa Mathews struggles with co-worker sabotage at lunch. She tends to order meals she shouldn't because her co-worker likes to order out. She tends to do this 3 times per week. She also reports some celebration eating over the weekend.  Subjective:   1. Vitamin D deficiency Theresa Mathews's Vit D level is not at goal. Last Vit D level was 41.4. She is on prescription Vit D every 3 days.  2. Other depression, emotional eating Theresa Mathews reports she lacks resolve to make good choices. Overall she notes her mood is slightly depressed. She is on Citalopram 20 mg daily. She tolerates this well except for sleepiness . She manages this by taking it at night.   Assessment/Plan:   1. Vitamin D deficiency Low Vitamin D level contributes to fatigue and are associated with obesity, breast, and colon cancer. Theresa Mathews agreed to continue taking prescription Vitamin D and will follow-up for routine testing of Vitamin D, at least 2-3 times per year to avoid over-replacement. We will recheck labs at her next visit.  2. Other depression, emotional eating Behavior modification techniques were discussed today to help Theresa Mathews deal with her emotional/non-hunger eating behaviors. We will refill bupropion for 1 month, and she will continue Celexa. She will search for a counselor  to see regularly. Orders and follow up as documented in patient record.   - buPROPion (WELLBUTRIN SR) 200 MG 12 hr tablet; Take 1 tablet (200 mg total) by mouth 2 (two) times daily.  Dispense: 60 tablet; Refill: 0   4. Class 2 severe obesity with serious comorbidity and body mass index (BMI) of 37.0 to 37.9 in adult, unspecified obesity type Theresa Mathews) Theresa Mathews is currently in the action stage of change. As such, her goal is to continue with weight loss efforts. She has agreed to the Category 2 Plan and keeping a food journal and adhering to recommended goals of 300-400 calories and 30 grams of protein at lunch daily.   Exercise goals: Theresa Mathews is to continue her current exercise regimen as is.  Behavioral modification strategies: increasing lean protein intake, decreasing simple carbohydrates, dealing with family or coworker sabotage and avoiding temptations.  Theresa Mathews has agreed to follow-up with our clinic in 3 weeks. She was informed of the importance of frequent follow-up visits to maximize her success with intensive lifestyle modifications for her multiple health conditions.   Objective:   Blood pressure 131/83, pulse 80, temperature 98.3 F (36.8 C), temperature source Oral, height 5\' 4"  (1.626 m), weight 222 lb (100.7 kg), SpO2 96 %. Body mass index is 38.11 kg/m.  General: Cooperative, alert, well developed, in no acute distress. HEENT: Conjunctivae and lids unremarkable. Cardiovascular: Regular rhythm.  Lungs: Normal work of breathing. Neurologic: No focal deficits.   Lab Results  Component Value Date   CREATININE 0.99 02/02/2019   BUN 17 02/02/2019  NA 138 02/02/2019   K 4.7 02/02/2019   CL 101 02/02/2019   CO2 24 02/02/2019   Lab Results  Component Value Date   ALT 14 02/02/2019   AST 17 02/02/2019   ALKPHOS 97 02/02/2019   BILITOT 0.4 02/02/2019   Lab Results  Component Value Date   HGBA1C 5.4 02/02/2019   HGBA1C 5.4 10/03/2018   HGBA1C 5.4 03/14/2018   HGBA1C 5.4  11/24/2017   HGBA1C 5.3 08/26/2017   Lab Results  Component Value Date   INSULIN 7.0 02/02/2019   INSULIN 9.5 03/14/2018   INSULIN 11.2 11/24/2017   INSULIN 9.4 08/26/2017   Lab Results  Component Value Date   TSH 1.290 08/26/2017   Lab Results  Component Value Date   CHOL 201 (H) 02/02/2019   HDL 56 02/02/2019   LDLCALC 128 (H) 02/02/2019   TRIG 94 02/02/2019   CHOLHDL 4.5 (H) 10/03/2018   Lab Results  Component Value Date   WBC 8.5 07/12/2017   HGB 13.7 07/12/2017   HCT 39.8 07/12/2017   MCV 88.9 07/12/2017   PLT 383.0 07/12/2017   No results found for: IRON, TIBC, FERRITIN  Attestation Statements:   Reviewed by clinician on day of visit: allergies, medications, problem list, medical history, surgical history, family history, social history, and previous encounter notes.   Wilhemena Durie, am acting as Location manager for [employer], FNP-C.  I have reviewed the above documentation for accuracy and completeness, and I agree with the above. -  Georgianne Fick, FNP

## 2019-05-25 ENCOUNTER — Encounter (INDEPENDENT_AMBULATORY_CARE_PROVIDER_SITE_OTHER): Payer: Self-pay | Admitting: Family Medicine

## 2019-06-14 ENCOUNTER — Other Ambulatory Visit: Payer: Self-pay

## 2019-06-14 ENCOUNTER — Ambulatory Visit (INDEPENDENT_AMBULATORY_CARE_PROVIDER_SITE_OTHER): Payer: BC Managed Care – PPO | Admitting: Physician Assistant

## 2019-06-14 ENCOUNTER — Encounter (INDEPENDENT_AMBULATORY_CARE_PROVIDER_SITE_OTHER): Payer: Self-pay | Admitting: Physician Assistant

## 2019-06-14 VITALS — BP 131/82 | HR 73 | Temp 98.0°F | Ht 64.0 in | Wt 224.0 lb

## 2019-06-14 DIAGNOSIS — Z9189 Other specified personal risk factors, not elsewhere classified: Secondary | ICD-10-CM | POA: Diagnosis not present

## 2019-06-14 DIAGNOSIS — R739 Hyperglycemia, unspecified: Secondary | ICD-10-CM | POA: Diagnosis not present

## 2019-06-14 DIAGNOSIS — E7849 Other hyperlipidemia: Secondary | ICD-10-CM | POA: Diagnosis not present

## 2019-06-14 DIAGNOSIS — Z6838 Body mass index (BMI) 38.0-38.9, adult: Secondary | ICD-10-CM

## 2019-06-14 DIAGNOSIS — E559 Vitamin D deficiency, unspecified: Secondary | ICD-10-CM

## 2019-06-14 DIAGNOSIS — E66812 Obesity, class 2: Secondary | ICD-10-CM

## 2019-06-14 MED ORDER — VITAMIN D3 1.25 MG (50000 UT) PO CAPS: 1.0000 | ORAL_CAPSULE | ORAL | 0 refills | Status: DC

## 2019-06-14 NOTE — Progress Notes (Signed)
Chief Complaint:   OBESITY Theresa Mathews is here to discuss her progress with her obesity treatment plan along with follow-up of her obesity related diagnoses. Theresa Mathews is on the Category 2 Plan and states she is following her eating plan approximately 80% of the time. Theresa Mathews states she is walking 15-30 minutes 7 times per week.  Today's visit was #: 39 Starting weight: 220 lbs Starting date: 08/26/2017 Today's weight: 224 lbs Today's date: 06/14/2019 Total lbs lost to date: 0 Total lbs lost since last in-office visit: 0  Interim History: Theresa Mathews reports that she is under a great deal of stress at work and home and is having a hard time staying on plan. She is not taking her Wellbutrin or Celexa as prescribed, as she forgets to take it.  Subjective:   Hyperglycemia. Theresa Mathews has a history of some elevated blood glucose readings without a diagnosis of diabetes. She is on metformin. No nausea, vomiting, diarrhea, or polyphagia. Last A1c 5.4. She is due for labs.  Vitamin D deficiency. Theresa Mathews is on prescription Vitamin D twice weekly. No nausea, vomiting, or muscle weakness. Last Vitamin D 41.4 on 02/02/2019. She is due for labs.  Other hyperlipidemia. Theresa Mathews is on no medications. No chest pain. She is exercising regularly. She is due for labs.   Lab Results  Component Value Date   CHOL 201 (H) 02/02/2019   HDL 56 02/02/2019   LDLCALC 128 (H) 02/02/2019   TRIG 94 02/02/2019   CHOLHDL 4.5 (H) 10/03/2018   Lab Results  Component Value Date   ALT 14 02/02/2019   AST 17 02/02/2019   ALKPHOS 97 02/02/2019   BILITOT 0.4 02/02/2019   The 10-year ASCVD risk score Mikey Bussing DC Jr., et al., 2013) is: 2.3%   Values used to calculate the score:     Age: 54 years     Sex: Female     Is Non-Hispanic African American: No     Diabetic: No     Tobacco smoker: No     Systolic Blood Pressure: [id number] mmHg     Is BP treated: Yes     HDL Cholesterol: 56 mg/dL     Total Cholesterol: 201  mg/dL  At risk for diabetes mellitus. Theresa Mathews is at higher than average risk for developing diabetes due to her obesity.   Assessment/Plan:   Hyperglycemia. Fasting labs will be obtained and results with be discussed with Seth Bake in 2 weeks at her follow up visit. In the meanwhile Theresa Mathews was started on a lower simple carbohydrate diet and will work on weight loss efforts. Theresa Mathews will continue metformin as directed. Insulin, random, Comprehensive metabolic panel, Hemoglobin A1c labs ordered today.  Vitamin D deficiency. Low Vitamin D level contributes to fatigue and are associated with obesity, breast, and colon cancer. She was given a refill on her Cholecalciferol (VITAMIN D3) 1.25 MG (50000 UT) CAPS twice weekly #10 with 0 refills and VITAMIN D 25 Hydroxy (Vit-D Deficiency, Fractures) level was ordered today.  Other hyperlipidemia. Cardiovascular risk and specific lipid/LDL goals reviewed.  We discussed several lifestyle modifications today and [name] will continue to work on diet, exercise and weight loss efforts. Orders and follow up as documented in patient record.   Counseling Intensive lifestyle modifications are the first line treatment for this issue. . Dietary changes: Increase soluble fiber. Decrease simple carbohydrates. . Exercise changes: Moderate to vigorous-intensity aerobic activity 150 minutes per week if tolerated. . Lipid-lowering medications: see documented in medical record.  Lipid panel  At risk for diabetes mellitus. Theresa Mathews was given approximately 15 minutes of diabetes education and counseling today. We discussed intensive lifestyle modifications today with an emphasis on weight loss as well as increasing exercise and decreasing simple carbohydrates in her diet. We also reviewed medication options with an emphasis on risk versus benefit of those discussed.   Repetitive spaced learning was employed today to elicit superior memory formation and behavioral change.  Class 2  severe obesity with serious comorbidity and body mass index (BMI) of 38.0 to 38.9 in adult, unspecified obesity type ([place]).  [name] is currently in the action stage of change. As such, her goal is to continue with weight loss efforts. She has agreed to the Category 2 Plan and journal 400-500 calories and 35 grams of protein at supper.  She will focus on eating dinner every night.   Exercise goals: For substantial health benefits, adults should do at least 150 minutes (2 hours and 30 minutes) a week of moderate-intensity, or 75 minutes (1 hour and 15 minutes) a week of vigorous-intensity aerobic physical activity, or an equivalent combination of moderate- and vigorous-intensity aerobic activity. Aerobic activity should be performed in episodes of at least 10 minutes, and preferably, it should be spread throughout the week.  Behavioral modification strategies: increasing lean protein intake and no skipping meals.  Theresa Mathews has agreed to follow-up with our clinic in 3 weeks. She was informed of the importance of frequent follow-up visits to maximize her success with intensive lifestyle modifications for her multiple health conditions.   Theresa Mathews was informed we would discuss her lab results at her next visit unless there is a critical issue that needs to be addressed sooner. Theresa Mathews agreed to keep her next visit at the agreed upon time to discuss these results.  Objective:   Blood pressure 131/82, pulse 73, temperature 98 F (36.7 C), temperature source Oral, height 5\' 4"  (1.626 m), weight 224 lb (101.6 kg), SpO2 99 %. Body mass index is 38.45 kg/m.  General: Cooperative, alert, well developed, in no acute distress. HEENT: Conjunctivae and lids unremarkable. Cardiovascular: Regular rhythm.  Lungs: Normal work of breathing. Neurologic: No focal deficits.   Lab Results  Component Value Date   CREATININE 0.99 02/02/2019   BUN 17 02/02/2019   NA 138 02/02/2019   K 4.7 02/02/2019   CL 101  02/02/2019   CO2 24 02/02/2019   Lab Results  Component Value Date   ALT 14 02/02/2019   AST 17 02/02/2019   ALKPHOS 97 02/02/2019   BILITOT 0.4 02/02/2019   Lab Results  Component Value Date   HGBA1C 5.4 02/02/2019   HGBA1C 5.4 10/03/2018   HGBA1C 5.4 03/14/2018   HGBA1C 5.4 11/24/2017   HGBA1C 5.3 08/26/2017   Lab Results  Component Value Date   INSULIN 7.0 02/02/2019   INSULIN 9.5 03/14/2018   INSULIN 11.2 11/24/2017   INSULIN 9.4 08/26/2017   Lab Results  Component Value Date   TSH 1.290 08/26/2017   Lab Results  Component Value Date   CHOL 201 (H) 02/02/2019   HDL 56 02/02/2019   LDLCALC 128 (H) 02/02/2019   TRIG 94 02/02/2019   CHOLHDL 4.5 (H) 10/03/2018   Lab Results  Component Value Date   WBC 8.5 07/12/2017   HGB 13.7 07/12/2017   HCT 39.8 07/12/2017   MCV 88.9 07/12/2017   PLT 383.0 07/12/2017   No results found for: IRON, TIBC, FERRITIN  Attestation Statements:   Reviewed by clinician on  day of visit: allergies, medications, problem list, medical history, surgical history, family history, social history, and previous encounter notes.  IMichaelene Song, am acting as transcriptionist for Abby Potash, PA-C   I have reviewed the above documentation for accuracy and completeness, and I agree with the above. Abby Potash, PA-C

## 2019-06-15 LAB — COMPREHENSIVE METABOLIC PANEL
ALT: 16 IU/L (ref 0–32)
AST: 14 IU/L (ref 0–40)
Albumin/Globulin Ratio: 1.5 (ref 1.2–2.2)
Albumin: 4 g/dL (ref 3.8–4.9)
Alkaline Phosphatase: 114 IU/L (ref 39–117)
BUN/Creatinine Ratio: 18 (ref 9–23)
BUN: 18 mg/dL (ref 6–24)
Bilirubin Total: 0.4 mg/dL (ref 0.0–1.2)
CO2: 25 mmol/L (ref 20–29)
Calcium: 9.5 mg/dL (ref 8.7–10.2)
Chloride: 102 mmol/L (ref 96–106)
Creatinine, Ser: 1.01 mg/dL — ABNORMAL HIGH (ref 0.57–1.00)
GFR calc Af Amer: 73 mL/min/{1.73_m2} (ref >59)
GFR calc non Af Amer: 64 mL/min/{1.73_m2} (ref >59)
Globulin, Total: 2.7 g/dL (ref 1.5–4.5)
Glucose: 105 mg/dL — ABNORMAL HIGH (ref 65–99)
Potassium: 4.8 mmol/L (ref 3.5–5.2)
Sodium: 141 mmol/L (ref 134–144)
Total Protein: 6.7 g/dL (ref 6.0–8.5)

## 2019-06-15 LAB — LIPID PANEL
Chol/HDL Ratio: 3.1 ratio (ref 0.0–4.4)
Cholesterol, Total: 227 mg/dL — ABNORMAL HIGH (ref 100–199)
HDL: 74 mg/dL (ref >39)
LDL Chol Calc (NIH): 137 mg/dL — ABNORMAL HIGH (ref 0–99)
Triglycerides: 92 mg/dL (ref 0–149)
VLDL Cholesterol Cal: 16 mg/dL (ref 5–40)

## 2019-06-15 LAB — VITAMIN D 25 HYDROXY (VIT D DEFICIENCY, FRACTURES): Vit D, 25-Hydroxy: 34.2 ng/mL (ref 30.0–100.0)

## 2019-06-15 LAB — HEMOGLOBIN A1C
Est. average glucose Bld gHb Est-mCnc: 103 mg/dL
Hgb A1c MFr Bld: 5.2 % (ref 4.8–5.6)

## 2019-06-15 LAB — INSULIN, RANDOM: INSULIN: 11 u[IU]/mL (ref 2.6–24.9)

## 2019-07-04 ENCOUNTER — Other Ambulatory Visit (INDEPENDENT_AMBULATORY_CARE_PROVIDER_SITE_OTHER): Payer: Self-pay | Admitting: Physician Assistant

## 2019-07-04 DIAGNOSIS — E559 Vitamin D deficiency, unspecified: Secondary | ICD-10-CM

## 2019-07-05 ENCOUNTER — Encounter (INDEPENDENT_AMBULATORY_CARE_PROVIDER_SITE_OTHER): Payer: Self-pay | Admitting: Physician Assistant

## 2019-07-05 ENCOUNTER — Other Ambulatory Visit: Payer: Self-pay

## 2019-07-05 ENCOUNTER — Ambulatory Visit (INDEPENDENT_AMBULATORY_CARE_PROVIDER_SITE_OTHER): Payer: BC Managed Care – PPO | Admitting: Physician Assistant

## 2019-07-05 VITALS — BP 119/79 | HR 66 | Temp 98.3°F | Ht 64.0 in | Wt 223.0 lb

## 2019-07-05 DIAGNOSIS — Z6838 Body mass index (BMI) 38.0-38.9, adult: Secondary | ICD-10-CM

## 2019-07-05 DIAGNOSIS — E559 Vitamin D deficiency, unspecified: Secondary | ICD-10-CM | POA: Diagnosis not present

## 2019-07-05 NOTE — Progress Notes (Signed)
Chief Complaint:   OBESITY Theresa Mathews is here to discuss her progress with her obesity treatment plan along with follow-up of her obesity related diagnoses. Theresa Mathews is on the Category 2 Plan and states she is following her eating plan approximately 60% of the time. Theresa Mathews states she is walking for 20-30 minutes 4-5 times per week.  Today's visit was #: 60 Starting weight: 220 lbs Starting date: 08/26/2017 Today's weight: 223 lbs Today's date: 07/05/2019 Total lbs lost to date: 0 Total lbs lost since last in-office visit: 1 lb  Interim History: Theresa Mathews reports that the first week she did well and meal planned well.  She states her hunger is controlled.  She is not drinking enough water.  Subjective:   1. Vitamin D deficiency Theresa Mathews Vitamin D level was 34.2 on 06/14/2019. She is currently taking prescription vitamin D 50,000 IU every 3 days.  She denies nausea, vomiting or muscle weakness.  Assessment/Plan:   1. Vitamin D deficiency Low Vitamin D level contributes to fatigue and are associated with obesity, breast, and colon cancer. She agrees to continue to take prescription Vitamin D @50 ,000 IU every 3 days and will follow-up for routine testing of Vitamin D, at least 2-3 times per year to avoid over-replacement.  2. Class 2 severe obesity with serious comorbidity and body mass index (BMI) of 38.0 to 38.9 in adult, unspecified obesity type Theresa Mathews is currently in the action stage of change. As such, her goal is to continue with weight loss efforts. She has agreed to the Category 2 Plan.   Exercise goals: As is.  Behavioral modification strategies: increasing water intake, meal planning and cooking strategies and planning for success.  Theresa Mathews has agreed to follow-up with our clinic in 3 weeks. She was informed of the importance of frequent follow-up visits to maximize her success with intensive lifestyle modifications for her multiple health conditions.   Objective:   Blood  pressure 119/79, pulse 66, temperature 98.3 F (36.8 C), temperature source Oral, height 5\' 4"  (1.626 m), weight 223 lb (101.2 kg), SpO2 98 %. Body mass index is 38.28 kg/m.  General: Cooperative, alert, well developed, in no acute distress. HEENT: Conjunctivae and lids unremarkable. Cardiovascular: Regular rhythm.  Lungs: Normal work of breathing. Neurologic: No focal deficits.   Lab Results  Component Value Date   CREATININE 1.01 (H) 06/14/2019   BUN 18 06/14/2019   NA 141 06/14/2019   K 4.8 06/14/2019   CL 102 06/14/2019   CO2 25 06/14/2019   Lab Results  Component Value Date   ALT 16 06/14/2019   AST 14 06/14/2019   ALKPHOS 114 06/14/2019   BILITOT 0.4 06/14/2019   Lab Results  Component Value Date   HGBA1C 5.2 06/14/2019   HGBA1C 5.4 02/02/2019   HGBA1C 5.4 10/03/2018   HGBA1C 5.4 03/14/2018   HGBA1C 5.4 11/24/2017   Lab Results  Component Value Date   INSULIN 11.0 06/14/2019   INSULIN 7.0 02/02/2019   INSULIN 9.5 03/14/2018   INSULIN 11.2 11/24/2017   INSULIN 9.4 08/26/2017   Lab Results  Component Value Date   TSH 1.290 08/26/2017   Lab Results  Component Value Date   CHOL 227 (H) 06/14/2019   HDL 74 06/14/2019   LDLCALC 137 (H) 06/14/2019   TRIG 92 06/14/2019   CHOLHDL 3.1 06/14/2019   Lab Results  Component Value Date   WBC 8.5 07/12/2017   HGB 13.7 07/12/2017   HCT 39.8 07/12/2017   MCV 88.9  07/12/2017   PLT 383.0 07/12/2017   Attestation Statements:   Reviewed by clinician on day of visit: allergies, medications, problem list, medical history, surgical history, family history, social history, and previous encounter notes.  Time spent on visit including pre-visit chart review and post-visit care and charting was 30 minutes.   I, Water quality scientist, CMA, am acting as Location manager for [employer], PA-C.  I have reviewed the above documentation for accuracy and completeness, and I agree with the above. Abby Potash, PA-C

## 2019-07-06 DIAGNOSIS — F331 Major depressive disorder, recurrent, moderate: Secondary | ICD-10-CM | POA: Diagnosis not present

## 2019-07-21 DIAGNOSIS — F331 Major depressive disorder, recurrent, moderate: Secondary | ICD-10-CM | POA: Diagnosis not present

## 2019-07-26 ENCOUNTER — Ambulatory Visit (INDEPENDENT_AMBULATORY_CARE_PROVIDER_SITE_OTHER): Payer: BC Managed Care – PPO | Admitting: Physician Assistant

## 2019-08-08 ENCOUNTER — Other Ambulatory Visit: Payer: Self-pay

## 2019-08-08 ENCOUNTER — Encounter (INDEPENDENT_AMBULATORY_CARE_PROVIDER_SITE_OTHER): Payer: Self-pay | Admitting: Family Medicine

## 2019-08-08 ENCOUNTER — Ambulatory Visit (INDEPENDENT_AMBULATORY_CARE_PROVIDER_SITE_OTHER): Payer: BC Managed Care – PPO | Admitting: Family Medicine

## 2019-08-08 VITALS — BP 133/80 | HR 68 | Temp 97.8°F | Ht 64.0 in | Wt 226.0 lb

## 2019-08-08 DIAGNOSIS — Z6838 Body mass index (BMI) 38.0-38.9, adult: Secondary | ICD-10-CM

## 2019-08-08 DIAGNOSIS — F3289 Other specified depressive episodes: Secondary | ICD-10-CM | POA: Diagnosis not present

## 2019-08-08 NOTE — Progress Notes (Signed)
Chief Complaint:   OBESITY Theresa Mathews is here to discuss her progress with her obesity treatment plan along with follow-up of her obesity related diagnoses. Theresa Mathews is on the Category 2 Plan and states she is following her eating plan approximately 60% of the time. Theresa Mathews states she is walking for 20-30 minutes 5 times per week.  Today's visit was #: 68 Starting weight: 220 lbs Starting date: 08/26/2017 Today's weight: 226 lbs Today's date: 08/08/2019 Total lbs lost to date: 0 Total lbs lost since last in-office visit: 0  Interim History: Theresa Mathews has had a stressful time at work recently. She notes it takes very little to get her off track with her meal plan. She is very hard on herself and feels she should be able to do much better than she has. She notes significant depression and anxiety.  Subjective:   1. Other depression, emotional eating Theresa Mathews is establishing care with a counselor who she will see every 2 weeks. She has significant stress caring for her husband who has multiple sclerosis and is wheelchair bound when leaving the house and uses walker in the house. She must take him to all medical visits. She notes significant anxiety and depression, and she is not always compliant with her medications because she forgets them. She has been on citalopram for quite a while and is unsure how much it is helping. She does feels that she benefit from bupropion which we started for emotional eating.   Assessment/Plan:   1. Other depression, emotional eating . We discussed ways to improve compliance with medications.  I suggested that she speak wit the counselor about seeing a psychiatrist for medication management and Theresa Mathews thinks this is a good idea.  I told her that it is very difficult to work on weight management in the presence of significant unmanaged depression.   2. Class 2 severe obesity with serious comorbidity and body mass index (BMI) of 38.0 to 38.9 in adult, unspecified  obesity type Theresa Mathews is currently in the action stage of change. As such, her goal is to continue with weight loss efforts. She has agreed to the Category 2 Plan.   Exercise goals: As is.  Behavioral modification strategies: no skipping meals, meal planning and cooking strategies and planning for success. I emphasized the importance of self care.  Theresa Mathews has agreed to follow-up with our clinic in 3 weeks. She was informed of the importance of frequent follow-up visits to maximize her success with intensive lifestyle modifications for her multiple health conditions.   Objective:   Blood pressure 133/80, pulse 68, temperature 97.8 F (36.6 C), temperature source Oral, height 5\' 4"  (1.626 m), weight 226 lb (102.5 kg), SpO2 99 %. Body mass index is 38.79 kg/m.  General: Cooperative, alert. Tearful at times. HEENT: Conjunctivae and lids unremarkable. Cardiovascular: Regular rhythm.  Lungs: Normal work of breathing. Neurologic: No focal deficits.   Lab Results  Component Value Date   CREATININE 1.01 (H) 06/14/2019   BUN 18 06/14/2019   NA 141 06/14/2019   K 4.8 06/14/2019   CL 102 06/14/2019   CO2 25 06/14/2019   Lab Results  Component Value Date   ALT 16 06/14/2019   AST 14 06/14/2019   ALKPHOS 114 06/14/2019   BILITOT 0.4 06/14/2019   Lab Results  Component Value Date   HGBA1C 5.2 06/14/2019   HGBA1C 5.4 02/02/2019   HGBA1C 5.4 10/03/2018   HGBA1C 5.4 03/14/2018   HGBA1C 5.4 11/24/2017   Lab  Results  Component Value Date   INSULIN 11.0 06/14/2019   INSULIN 7.0 02/02/2019   INSULIN 9.5 03/14/2018   INSULIN 11.2 11/24/2017   INSULIN 9.4 08/26/2017   Lab Results  Component Value Date   TSH 1.290 08/26/2017   Lab Results  Component Value Date   CHOL 227 (H) 06/14/2019   HDL 74 06/14/2019   LDLCALC 137 (H) 06/14/2019   TRIG 92 06/14/2019   CHOLHDL 3.1 06/14/2019   Lab Results  Component Value Date   WBC 8.5 07/12/2017   HGB 13.7 07/12/2017   HCT 39.8  07/12/2017   MCV 88.9 07/12/2017   PLT 383.0 07/12/2017   No results found for: IRON, TIBC, FERRITIN  Attestation Statements:   Reviewed by clinician on day of visit: allergies, medications, problem list, medical history, surgical history, family history, social history, and previous encounter notes.    Wilhemena Durie, am acting as Location manager for [employer], FNP-C.  I have reviewed the above documentation for accuracy and completeness, and I agree with the above. -  Georgianne Fick, FNP

## 2019-08-09 DIAGNOSIS — F331 Major depressive disorder, recurrent, moderate: Secondary | ICD-10-CM | POA: Diagnosis not present

## 2019-08-23 DIAGNOSIS — F331 Major depressive disorder, recurrent, moderate: Secondary | ICD-10-CM | POA: Diagnosis not present

## 2019-08-31 ENCOUNTER — Encounter (INDEPENDENT_AMBULATORY_CARE_PROVIDER_SITE_OTHER): Payer: Self-pay | Admitting: Family Medicine

## 2019-08-31 ENCOUNTER — Other Ambulatory Visit: Payer: Self-pay

## 2019-08-31 ENCOUNTER — Ambulatory Visit (INDEPENDENT_AMBULATORY_CARE_PROVIDER_SITE_OTHER): Payer: BC Managed Care – PPO | Admitting: Family Medicine

## 2019-08-31 VITALS — BP 118/76 | HR 70 | Temp 98.0°F | Ht 64.0 in | Wt 224.0 lb

## 2019-08-31 DIAGNOSIS — Z9189 Other specified personal risk factors, not elsewhere classified: Secondary | ICD-10-CM | POA: Diagnosis not present

## 2019-08-31 DIAGNOSIS — E559 Vitamin D deficiency, unspecified: Secondary | ICD-10-CM | POA: Diagnosis not present

## 2019-08-31 DIAGNOSIS — E8881 Metabolic syndrome: Secondary | ICD-10-CM | POA: Diagnosis not present

## 2019-08-31 DIAGNOSIS — Z6838 Body mass index (BMI) 38.0-38.9, adult: Secondary | ICD-10-CM

## 2019-08-31 DIAGNOSIS — F3289 Other specified depressive episodes: Secondary | ICD-10-CM | POA: Diagnosis not present

## 2019-08-31 MED ORDER — METFORMIN HCL 500 MG PO TABS: 500.0000 mg | ORAL_TABLET | Freq: Every day | ORAL | 0 refills | Status: DC

## 2019-08-31 MED ORDER — BUPROPION HCL ER (SR) 200 MG PO TB12: 200.0000 mg | ORAL_TABLET | Freq: Two times a day (BID) | ORAL | 0 refills | Status: DC

## 2019-08-31 MED ORDER — VITAMIN D3 1.25 MG (50000 UT) PO CAPS: 1.0000 | ORAL_CAPSULE | ORAL | 0 refills | Status: DC

## 2019-08-31 NOTE — Progress Notes (Signed)
Chief Complaint:   OBESITY Theresa Mathews is here to discuss her progress with her obesity treatment plan along with follow-up of her obesity related diagnoses. Theresa Mathews is on the Category 2 Plan and states she is following her eating plan approximately 60% of the time. Theresa Mathews states she is walking for 30 minutes 3-4 times per week.  Today's visit was #: 69 Starting weight: 220 lbs Starting date: 08/26/2017 Today's weight: 224 lbs Today's date: 08/31/2019 Total lbs lost to date: 0 Total lbs lost since last in-office visit: 2  Interim History: She feels she has done better overall with the meal plan over the past few weeks. Theresa Mathews is having a shake at times for breakfast. She has meal prepped more frequently. She has not been ordering out for lunch.  Subjective:   1. Insulin resistance Theresa Mathews has a diagnosis of insulin resistance based on her elevated fasting insulin level >5. She denies polyphagia. She is on metformin and she is doing better with compliance. She continues to work on diet and exercise to decrease her risk of diabetes.  Lab Results  Component Value Date   INSULIN 11.0 06/14/2019   INSULIN 7.0 02/02/2019   INSULIN 9.5 03/14/2018   INSULIN 11.2 11/24/2017   INSULIN 9.4 08/26/2017   Lab Results  Component Value Date   HGBA1C 5.2 06/14/2019   2. Vitamin D deficiency Theresa Mathews's last Vit D level was low at 34.2. She is on prescription Vit D every 3 days.  3. Other depression, emotional eating Theresa Mathews notes better compliance with her medications overall. She feels her emotional eating is better overall. She is on bupropion and citalopram. Her mood is stable and she is seeing a counselor every 2 weeks.  4. At risk for dehydration Theresa Mathews is at risk for dehydration due to insufficient water intake.  Assessment/Plan:   1. Insulin resistance Theresa Mathews will continue to work on weight loss, exercise, and decreasing simple carbohydrates to help decrease the risk of diabetes. We will  refill metformin for 1 month. Theresa Mathews agreed to follow-up with [name] as directed to closely monitor her progress.  - metFORMIN (GLUCOPHAGE) 500 MG tablet; Take 1 tablet (500 mg total) by mouth daily with breakfast.  Dispense: 90 tablet; Refill: 0  2. Vitamin D deficiency Low Vitamin D level contributes to fatigue and are associated with obesity, breast, and colon cancer. We will refill prescription Vitamin D for 1 month. Theresa Mathews will follow-up for routine testing of Vitamin D, at least 2-3 times per year to avoid over-replacement.  - Cholecalciferol (VITAMIN D3) 1.25 MG (50000 UT) CAPS; Take 1 capsule by mouth every 3 (three) days.  Dispense: 10 capsule; Refill: 0  3. Other depression, emotional eating Behavior modification techniques were discussed today to help Theresa Mathews deal with her emotional/non-hunger eating behaviors. We will refill bupropion for 1 month. She will continue to work on compliance with meds.  - buPROPion (WELLBUTRIN SR) 200 MG 12 hr tablet; Take 1 tablet (200 mg total) by mouth 2 (two) times daily.  Dispense: 60 tablet; Refill: 0  4. At risk for dehydration Theresa Mathews was given approximately 15 minutes dehydration prevention counseling today. Theresa Mathews is at risk for dehydration due to weight loss and current medication(s). She was encouraged to hydrate and monitor fluid status to avoid dehydration as well as weight loss plateaus.   5. Class 2 severe obesity with serious comorbidity and body mass index (BMI) of 38.0 to 38.9 in adult, unspecified obesity type (HCC) Theresa Mathews is currently in the  action stage of change. As such, her goal is to continue with weight loss efforts. She has agreed to the Category 2 Plan.   We discussed having a snack on the way home from work to help control her hunger in the evening.  Exercise goals: As is.  Behavioral modification strategies: increasing lean protein intake, decreasing simple carbohydrates, decreasing eating out and better snacking  choices.  Theresa Mathews has agreed to follow-up with our clinic in 3 weeks. She was informed of the importance of frequent follow-up visits to maximize her success with intensive lifestyle modifications for her multiple health conditions.   Objective:   Blood pressure 118/76, pulse 70, temperature 98 F (36.7 C), temperature source Oral, height 5\' 4"  (1.626 m), weight 224 lb (101.6 kg), SpO2 100 %. Body mass index is 38.45 kg/m.  General: Cooperative, alert, well developed, in no acute distress. HEENT: Conjunctivae and lids unremarkable. Cardiovascular: Regular rhythm.  Lungs: Normal work of breathing. Neurologic: No focal deficits.   Lab Results  Component Value Date   CREATININE 1.01 (H) 06/14/2019   BUN 18 06/14/2019   NA 141 06/14/2019   K 4.8 06/14/2019   CL 102 06/14/2019   CO2 25 06/14/2019   Lab Results  Component Value Date   ALT 16 06/14/2019   AST 14 06/14/2019   ALKPHOS 114 06/14/2019   BILITOT 0.4 06/14/2019   Lab Results  Component Value Date   HGBA1C 5.2 06/14/2019   HGBA1C 5.4 02/02/2019   HGBA1C 5.4 10/03/2018   HGBA1C 5.4 03/14/2018   HGBA1C 5.4 11/24/2017   Lab Results  Component Value Date   INSULIN 11.0 06/14/2019   INSULIN 7.0 02/02/2019   INSULIN 9.5 03/14/2018   INSULIN 11.2 11/24/2017   INSULIN 9.4 08/26/2017   Lab Results  Component Value Date   TSH 1.290 08/26/2017   Lab Results  Component Value Date   CHOL 227 (H) 06/14/2019   HDL 74 06/14/2019   LDLCALC 137 (H) 06/14/2019   TRIG 92 06/14/2019   CHOLHDL 3.1 06/14/2019   Lab Results  Component Value Date   WBC 8.5 07/12/2017   HGB 13.7 07/12/2017   HCT 39.8 07/12/2017   MCV 88.9 07/12/2017   PLT 383.0 07/12/2017   No results found for: IRON, TIBC, FERRITIN  Attestation Statements:   Reviewed by clinician on day of visit: allergies, medications, problem list, medical history, surgical history, family history, social history, and previous encounter notes.   Wilhemena Durie, am acting as Location manager for [employer], FNP-C.  I have reviewed the above documentation for accuracy and completeness, and I agree with the above. -  Georgianne Fick, FNP

## 2019-09-05 DIAGNOSIS — F331 Major depressive disorder, recurrent, moderate: Secondary | ICD-10-CM | POA: Diagnosis not present

## 2019-09-22 DIAGNOSIS — H0014 Chalazion left upper eyelid: Secondary | ICD-10-CM | POA: Diagnosis not present

## 2019-09-22 DIAGNOSIS — F331 Major depressive disorder, recurrent, moderate: Secondary | ICD-10-CM | POA: Diagnosis not present

## 2019-09-22 DIAGNOSIS — H00021 Hordeolum internum right upper eyelid: Secondary | ICD-10-CM | POA: Diagnosis not present

## 2019-09-26 ENCOUNTER — Ambulatory Visit (INDEPENDENT_AMBULATORY_CARE_PROVIDER_SITE_OTHER): Payer: BC Managed Care – PPO | Admitting: Family Medicine

## 2019-09-26 ENCOUNTER — Encounter (INDEPENDENT_AMBULATORY_CARE_PROVIDER_SITE_OTHER): Payer: Self-pay | Admitting: Family Medicine

## 2019-09-26 ENCOUNTER — Other Ambulatory Visit: Payer: Self-pay

## 2019-09-26 VITALS — BP 126/77 | HR 70 | Temp 97.9°F | Ht 64.0 in | Wt 224.0 lb

## 2019-09-26 DIAGNOSIS — Z6838 Body mass index (BMI) 38.0-38.9, adult: Secondary | ICD-10-CM

## 2019-09-26 DIAGNOSIS — F3289 Other specified depressive episodes: Secondary | ICD-10-CM | POA: Diagnosis not present

## 2019-09-27 ENCOUNTER — Encounter (INDEPENDENT_AMBULATORY_CARE_PROVIDER_SITE_OTHER): Payer: Self-pay | Admitting: Family Medicine

## 2019-09-27 NOTE — Progress Notes (Addendum)
Chief Complaint:   OBESITY Theresa Mathews is here to discuss her progress with her obesity treatment plan along with follow-up of her obesity related diagnoses. Theresa Mathews is on the Category 2 Plan and states she is following her eating plan approximately 50% of the time. Theresa Mathews states she is doing 0 minutes 0 times per week.  Today's visit was #: 67 Starting weight: 220 lbs Starting date: 08/26/2017 Today's weight: 224 lbs Today's date: 09/26/2019 Total lbs lost to date: 0 Total lbs lost since last in-office visit: 0  Interim History: Avelyn notes decreased energy. She has eaten out more than usual for supper, and she is meal prepping less. However, she is doing better with packing lunch and not eating out at [employer]. She is doing better with getting breakfast in.  Subjective:   1. Other depression, emotional eating Theresa Mathews's mood has declined. She is not always compliant with her medications, she forgets. She is on bupropion and Celexa.  Assessment/Plan:   1. Other depression, emotional eating  We discussed strategies for remembering her medications. Orders and follow up as documented in patient record.   2. Class 2 severe obesity with serious comorbidity and body mass index (BMI) of 38.0 to 38.9 in adult, unspecified obesity type [facility]) Theresa Mathews is currently in the action stage of change. As such, her goal is to continue with weight loss efforts. She has agreed to the Category 2 Plan.   Exercise goals: No exercise has been prescribed at this time.  Behavioral modification strategies: meal planning and cooking strategies.  Theresa Mathews has agreed to follow-up with our clinic in 3 weeks. She was informed of the importance of frequent follow-up visits to maximize her success with intensive lifestyle modifications for her multiple health conditions.   Objective:   Blood pressure 126/77, pulse 70, temperature 97.9 F (36.6 C), temperature source Oral, height 5\' 4"  (1.626 m), weight 224 lb (101.6 kg),  SpO2 97 %. Body mass index is 38.45 kg/m.  General: Cooperative, alert, well developed, in no acute distress. HEENT: Conjunctivae and lids unremarkable. Cardiovascular: Regular rhythm.  Lungs: Normal work of breathing. Neurologic: No focal deficits.   Lab Results  Component Value Date   CREATININE 1.01 (H) 06/14/2019   BUN 18 06/14/2019   NA 141 06/14/2019   K 4.8 06/14/2019   CL 102 06/14/2019   CO2 25 06/14/2019   Lab Results  Component Value Date   ALT 16 06/14/2019   AST 14 06/14/2019   ALKPHOS 114 06/14/2019   BILITOT 0.4 06/14/2019   Lab Results  Component Value Date   HGBA1C 5.2 06/14/2019   HGBA1C 5.4 02/02/2019   HGBA1C 5.4 10/03/2018   HGBA1C 5.4 03/14/2018   HGBA1C 5.4 11/24/2017   Lab Results  Component Value Date   INSULIN 11.0 06/14/2019   INSULIN 7.0 02/02/2019   INSULIN 9.5 03/14/2018   INSULIN 11.2 11/24/2017   INSULIN 9.4 08/26/2017   Lab Results  Component Value Date   TSH 1.290 08/26/2017   Lab Results  Component Value Date   CHOL 227 (H) 06/14/2019   HDL 74 06/14/2019   LDLCALC 137 (H) 06/14/2019   TRIG 92 06/14/2019   CHOLHDL 3.1 06/14/2019   Lab Results  Component Value Date   WBC 8.5 07/12/2017   HGB 13.7 07/12/2017   HCT 39.8 07/12/2017   MCV 88.9 07/12/2017   PLT 383.0 07/12/2017   No results found for: IRON, TIBC, FERRITIN  Attestation Statements:   Reviewed by clinician on day  of visit: allergies, medications, problem list, medical history, surgical history, family history, social history, and previous encounter notes.   Wilhemena Durie, am acting as Location manager for [employer], FNP-C.  I have reviewed the above documentation for accuracy and completeness, and I agree with the above. -  Georgianne Fick, FNP

## 2019-10-05 DIAGNOSIS — F331 Major depressive disorder, recurrent, moderate: Secondary | ICD-10-CM | POA: Diagnosis not present

## 2019-10-20 DIAGNOSIS — F331 Major depressive disorder, recurrent, moderate: Secondary | ICD-10-CM | POA: Diagnosis not present

## 2019-10-25 ENCOUNTER — Other Ambulatory Visit: Payer: Self-pay

## 2019-10-25 ENCOUNTER — Ambulatory Visit (INDEPENDENT_AMBULATORY_CARE_PROVIDER_SITE_OTHER): Payer: BC Managed Care – PPO | Admitting: Family Medicine

## 2019-10-25 ENCOUNTER — Encounter (INDEPENDENT_AMBULATORY_CARE_PROVIDER_SITE_OTHER): Payer: Self-pay | Admitting: Family Medicine

## 2019-10-25 VITALS — BP 133/77 | HR 75 | Temp 98.1°F | Ht 64.0 in | Wt 226.0 lb

## 2019-10-25 DIAGNOSIS — F32A Depression, unspecified: Secondary | ICD-10-CM | POA: Insufficient documentation

## 2019-10-25 DIAGNOSIS — Z6838 Body mass index (BMI) 38.0-38.9, adult: Secondary | ICD-10-CM

## 2019-10-25 DIAGNOSIS — F3289 Other specified depressive episodes: Secondary | ICD-10-CM | POA: Diagnosis not present

## 2019-10-25 NOTE — Progress Notes (Signed)
Chief Complaint:   OBESITY Theresa Mathews is here to discuss her progress with her obesity treatment plan along with follow-up of her obesity related diagnoses. [name] is on the Category 2 Plan and states she is following her eating plan approximately 50% of the time. [name] states she is doing 0 minutes 0 times per week.  Today's visit was #: 14 Starting weight: 220 lbs Starting date: 08/26/2017 Today's weight: 226 lbs Today's date: 10/25/2019 Total lbs lost to date: 0 Total lbs lost since last in-office visit: 0  Interim History: [name] is skipping breakfast some. She is cooking more at night but not always healthy recipes. She is not getting in enough protein. She struggles with consistency in all aspects of life due to stressors.  Subjective:   1. Other depression, emotional eating [name] is still inconsistent with taking all of her medications. She is seeing a counselor twice weekly. We have discussed strategies for this and she is working on it.  Assessment/Plan:   1. Other depression, emotional eating Behavior modification techniques were discussed today to help [name] deal with her emotional/non-hunger eating behaviors. [name] will try to be consistent with taking bupropion and all other medications.   2. Class 2 severe obesity with serious comorbidity and body mass index (BMI) of 38.0 to 38.9 in adult, unspecified obesity type [name]) [name] is currently in the action stage of change. As such, her goal is to continue with weight loss efforts. She has agreed to the Category 2 Plan and keeping a food journal and adhering to recommended goals of 200-300 calories and 20 grams of protein at breakfast daily.   We discussed Low carbohydrate plan but she does not feel this will work for her.  Exercise goals: No exercise has been prescribed at this time. [name] has plantar fasciitis currently.  Behavioral modification strategies: increasing lean protein intake, meal planning and cooking  strategies and better snacking choices.  [name] has agreed to follow-up with our clinic in 3 weeks. She was informed of the importance of frequent follow-up visits to maximize her success with intensive lifestyle modifications for her multiple health conditions.   Objective:   Blood pressure (!) 133/77, pulse 75, temperature 98.1 F (36.7 C), temperature source Oral, height 5\' 4"  (1.626 m), weight (!) 226 lb (102.5 kg), SpO2 97 %. Body mass index is 38.79 kg/m.  General: Cooperative, alert, well developed, in no acute distress. HEENT: Conjunctivae and lids unremarkable. Cardiovascular: Regular rhythm.  Lungs: Normal work of breathing. Neurologic: No focal deficits.   Lab Results  Component Value Date   CREATININE 1.01 (H) 06/14/2019   BUN 18 06/14/2019   NA 141 06/14/2019   K 4.8 06/14/2019   CL 102 06/14/2019   CO2 25 06/14/2019   Lab Results  Component Value Date   ALT 16 06/14/2019   AST 14 06/14/2019   ALKPHOS 114 06/14/2019   BILITOT 0.4 06/14/2019   Lab Results  Component Value Date   HGBA1C 5.2 06/14/2019   HGBA1C 5.4 02/02/2019   HGBA1C 5.4 10/03/2018   HGBA1C 5.4 03/14/2018   HGBA1C 5.4 11/24/2017   Lab Results  Component Value Date   INSULIN 11.0 06/14/2019   INSULIN 7.0 02/02/2019   INSULIN 9.5 03/14/2018   INSULIN 11.2 11/24/2017   INSULIN 9.4 08/26/2017   Lab Results  Component Value Date   TSH 1.290 08/26/2017   Lab Results  Component Value Date   CHOL 227 (H) 06/14/2019   HDL 74 06/14/2019  LDLCALC 137 (H) 06/14/2019   TRIG 92 06/14/2019   CHOLHDL 3.1 06/14/2019   Lab Results  Component Value Date   WBC 8.5 07/12/2017   HGB 13.7 07/12/2017   HCT 39.8 07/12/2017   MCV 88.9 07/12/2017   PLT 383.0 07/12/2017   No results found for: IRON, TIBC, FERRITIN  Attestation Statements:   Reviewed by clinician on day of visit: allergies, medications, problem list, medical history, surgical history, family history, social history, and  previous encounter notes.   Wilhemena Durie, am acting as Location manager for [employer], FNP-C.  I have reviewed the above documentation for accuracy and completeness, and I agree with the above. -  Georgianne Fick, FNP

## 2019-11-03 DIAGNOSIS — F331 Major depressive disorder, recurrent, moderate: Secondary | ICD-10-CM | POA: Diagnosis not present

## 2019-11-16 ENCOUNTER — Ambulatory Visit (INDEPENDENT_AMBULATORY_CARE_PROVIDER_SITE_OTHER): Payer: BC Managed Care – PPO | Admitting: Family Medicine

## 2019-11-16 ENCOUNTER — Other Ambulatory Visit: Payer: Self-pay

## 2019-11-16 ENCOUNTER — Encounter (INDEPENDENT_AMBULATORY_CARE_PROVIDER_SITE_OTHER): Payer: Self-pay | Admitting: Family Medicine

## 2019-11-16 VITALS — BP 123/84 | HR 72 | Temp 98.0°F | Ht 64.0 in | Wt 228.0 lb

## 2019-11-16 DIAGNOSIS — E7849 Other hyperlipidemia: Secondary | ICD-10-CM | POA: Diagnosis not present

## 2019-11-16 DIAGNOSIS — I10 Essential (primary) hypertension: Secondary | ICD-10-CM | POA: Diagnosis not present

## 2019-11-16 DIAGNOSIS — F3289 Other specified depressive episodes: Secondary | ICD-10-CM

## 2019-11-16 DIAGNOSIS — Z9189 Other specified personal risk factors, not elsewhere classified: Secondary | ICD-10-CM

## 2019-11-16 DIAGNOSIS — Z6839 Body mass index (BMI) 39.0-39.9, adult: Secondary | ICD-10-CM

## 2019-11-16 DIAGNOSIS — E8881 Metabolic syndrome: Secondary | ICD-10-CM | POA: Diagnosis not present

## 2019-11-16 MED ORDER — METFORMIN HCL 500 MG PO TABS: 500.0000 mg | ORAL_TABLET | Freq: Every day | ORAL | 0 refills | Status: DC

## 2019-11-16 MED ORDER — BUPROPION HCL ER (SR) 200 MG PO TB12: 200.0000 mg | ORAL_TABLET | Freq: Two times a day (BID) | ORAL | 0 refills | Status: DC

## 2019-11-16 MED ORDER — CITALOPRAM HYDROBROMIDE 20 MG PO TABS: 20.0000 mg | ORAL_TABLET | Freq: Every day | ORAL | 0 refills | Status: DC

## 2019-11-16 MED ORDER — TELMISARTAN 80 MG PO TABS: 80.0000 mg | ORAL_TABLET | Freq: Every day | ORAL | 0 refills | Status: DC

## 2019-11-16 NOTE — Progress Notes (Signed)
Chief Complaint:   OBESITY Daniah is here to discuss her progress with her obesity treatment plan along with follow-up of her obesity related diagnoses. Loreena is on the Category 2 Plan and keeping a food journal and adhering to recommended goals of 200-300 calories and 20 grams of protein at breakfast daily and states she is following her eating plan approximately 50% of the time. Jaelene states she is doing 0 minutes 0 times per week.  Today's visit was #: 44 Starting weight: 220 lbs Starting date: 08/26/2017 Today's weight: 228 lbs Today's date: 11/16/2019 Total lbs lost to date: 0 Total lbs lost since last in-office visit: 0  Interim History: Macrina is changing jobs and she needs a break from the program because she will have no insurance. She notes she has been eating out more often and stress eating at night.  Subjective:   1. Other hyperlipidemia Dorann has hyperlipidemia and has been trying to improve her cholesterol levels with intensive lifestyle modification including a low saturated fat diet, exercise and weight loss. Last LDL was elevated at 137, and triglycerides and HDL were within normal limits. She is not on statin.   Lab Results  Component Value Date   ALT 16 06/14/2019   AST 14 06/14/2019   ALKPHOS 114 06/14/2019   BILITOT 0.4 06/14/2019   Lab Results  Component Value Date   CHOL 227 (H) 06/14/2019   HDL 74 06/14/2019   LDLCALC 137 (H) 06/14/2019   TRIG 92 06/14/2019   CHOLHDL 3.1 06/14/2019   2. Essential hypertension Marquise's blood pressure is well controlled with telmisartan. Cardiovascular ROS: no chest pain or dyspnea on exertion.  BP Readings from Last 3 Encounters:  11/16/19 123/84  10/25/19 (!) 133/77  09/26/19 126/77   Lab Results  Component Value Date   CREATININE 1.01 (H) 06/14/2019   CREATININE 0.99 02/02/2019   CREATININE 0.83 10/03/2018   3. Insulin resistance Byrdie has a diagnosis of insulin resistance based on her elevated  fasting insulin level >5. She denies polyphagia but notes cravings. She continues to work on diet and exercise to decrease her risk of diabetes.  Lab Results  Component Value Date   INSULIN 11.0 06/14/2019   INSULIN 7.0 02/02/2019   INSULIN 9.5 03/14/2018   INSULIN 11.2 11/24/2017   INSULIN 9.4 08/26/2017   Lab Results  Component Value Date   HGBA1C 5.2 06/14/2019   4. Other depression, emotional eating Barba notes stress eating due to upcoming job change. She notes night eating.  5. At risk for heart disease Carlinda is at a higher than average risk for cardiovascular disease due to obesity, hypertension, and hyperlipidemia.   Assessment/Plan:   1. Other hyperlipidemia Cardiovascular risk and specific lipid/LDL goals reviewed. We discussed several lifestyle modifications today and Bessye will continue to work on diet, exercise and weight loss efforts. We will check labs today.  - Lipid Panel With LDL/HDL Ratio - VITAMIN D 25 Hydroxy (Vit-D Deficiency, Fractures)  2. Essential hypertension Carigan is working on healthy weight loss and exercise to improve blood pressure control. We will watch for signs of hypotension as she continues her lifestyle modifications. We will check labs today, and we will refill telmisartan for 90 days with no refill.  - Comprehensive metabolic panel - telmisartan (MICARDIS) 80 MG tablet; Take 1 tablet (80 mg total) by mouth daily. Take one daily  Dispense: 90 tablet; Refill: 0  3. Insulin resistance Makesha will continue to work on weight loss, exercise,  and decreasing simple carbohydrates to help decrease the risk of diabetes. We will check labs today, and we will refill metformin for 90 days with no refill. Seana agreed to follow-up with [name] as directed to closely monitor her progress.  - Hemoglobin A1c - Insulin, random - metFORMIN (GLUCOPHAGE) 500 MG tablet; Take 1 tablet (500 mg total) by mouth daily with breakfast.  Dispense: 90 tablet; Refill:  0  4. Other depression, emotional eating Behavior modification techniques were discussed today to help Anyra deal with her emotional/non-hunger eating behaviors. We will refill both bupropion and Celexa for 90 days with no refill. Orders and follow up as documented in patient record.   - buPROPion (WELLBUTRIN SR) 200 MG 12 hr tablet; Take 1 tablet (200 mg total) by mouth 2 (two) times daily.  Dispense: 180 tablet; Refill: 0 - citalopram (CELEXA) 20 MG tablet; Take 1 tablet (20 mg total) by mouth daily.  Dispense: 90 tablet; Refill: 0  5. At risk for heart disease Larinda was given approximately 15 minutes of coronary artery disease prevention counseling today. She is 54 y.o. female and has risk factors for heart disease including obesity. We discussed intensive lifestyle modifications today with an emphasis on specific weight loss instructions and strategies.   Repetitive spaced learning was employed today to elicit superior memory formation and behavioral change.  6. Class 2 severe obesity with serious comorbidity and body mass index (BMI) of 39.0 to 39.9 in adult, unspecified obesity type [facility]) Jahlisa is currently in the action stage of change. As such, her goal is to continue with weight loss efforts. She has agreed to the Category 2 Plan.   [place]aclara is to weigh herself weekly at home.  Exercise goals: All adults should avoid inactivity. Some physical activity is better than none, and adults who participate in any amount of physical activity gain some health benefits.  Behavioral modification strategies: increasing lean protein intake and decreasing simple carbohydrates.  Jaunice has agreed to follow-up with our clinic in 12 weeks at her request. She was informed of the importance of frequent follow-up visits to maximize her success with intensive lifestyle modifications for her multiple health conditions.   Shareeka was informed we would discuss her lab results at her next visit unless there  is a critical issue that needs to be addressed sooner. Jakyia agreed to keep her next visit at the agreed upon time to discuss these results.  Objective:   Blood pressure 123/84, pulse 72, temperature 98 F (36.7 C), temperature source Oral, height 5\' 4"  (1.626 m), weight 228 lb (103.4 kg), SpO2 98 %. Body mass index is 39.14 kg/m.  General: Cooperative, alert, well developed, in no acute distress. HEENT: Conjunctivae and lids unremarkable. Cardiovascular: Regular rhythm.  Lungs: Normal work of breathing. Neurologic: No focal deficits.   Lab Results  Component Value Date   CREATININE 1.01 (H) 06/14/2019   BUN 18 06/14/2019   NA 141 06/14/2019   K 4.8 06/14/2019   CL 102 06/14/2019   CO2 25 06/14/2019   Lab Results  Component Value Date   ALT 16 06/14/2019   AST 14 06/14/2019   ALKPHOS 114 06/14/2019   BILITOT 0.4 06/14/2019   Lab Results  Component Value Date   HGBA1C 5.2 06/14/2019   HGBA1C 5.4 02/02/2019   HGBA1C 5.4 10/03/2018   HGBA1C 5.4 03/14/2018   HGBA1C 5.4 11/24/2017   Lab Results  Component Value Date   INSULIN 11.0 06/14/2019   INSULIN 7.0 02/02/2019   INSULIN  9.5 03/14/2018   INSULIN 11.2 11/24/2017   INSULIN 9.4 08/26/2017   Lab Results  Component Value Date   TSH 1.290 08/26/2017   Lab Results  Component Value Date   CHOL 227 (H) 06/14/2019   HDL 74 06/14/2019   LDLCALC 137 (H) 06/14/2019   TRIG 92 06/14/2019   CHOLHDL 3.1 06/14/2019   Lab Results  Component Value Date   WBC 8.5 07/12/2017   HGB 13.7 07/12/2017   HCT 39.8 07/12/2017   MCV 88.9 07/12/2017   PLT 383.0 07/12/2017   No results found for: IRON, TIBC, FERRITIN  Attestation Statements:   Reviewed by clinician on day of visit: allergies, medications, problem list, medical history, surgical history, family history, social history, and previous encounter notes.   Wilhemena Durie, am acting as Location manager for [employer], FNP-C.  I have reviewed the above  documentation for accuracy and completeness, and I agree with the above. - Georgianne Fick, FNP

## 2019-11-17 DIAGNOSIS — F331 Major depressive disorder, recurrent, moderate: Secondary | ICD-10-CM | POA: Diagnosis not present

## 2019-11-17 LAB — INSULIN, RANDOM: INSULIN: 10.1 u[IU]/mL (ref 2.6–24.9)

## 2019-11-17 LAB — COMPREHENSIVE METABOLIC PANEL
ALT: 17 IU/L (ref 0–32)
AST: 16 IU/L (ref 0–40)
Albumin/Globulin Ratio: 1.5 (ref 1.2–2.2)
Albumin: 4.3 g/dL (ref 3.8–4.9)
Alkaline Phosphatase: 109 IU/L (ref 48–121)
BUN/Creatinine Ratio: 17 (ref 9–23)
BUN: 13 mg/dL (ref 6–24)
Bilirubin Total: 0.3 mg/dL (ref 0.0–1.2)
CO2: 28 mmol/L (ref 20–29)
Calcium: 9.2 mg/dL (ref 8.7–10.2)
Chloride: 102 mmol/L (ref 96–106)
Creatinine, Ser: 0.78 mg/dL (ref 0.57–1.00)
GFR calc Af Amer: 100 mL/min/{1.73_m2} (ref >59)
GFR calc non Af Amer: 87 mL/min/{1.73_m2} (ref >59)
Globulin, Total: 2.8 g/dL (ref 1.5–4.5)
Glucose: 114 mg/dL — ABNORMAL HIGH (ref 65–99)
Potassium: 4.6 mmol/L (ref 3.5–5.2)
Sodium: 141 mmol/L (ref 134–144)
Total Protein: 7.1 g/dL (ref 6.0–8.5)

## 2019-11-17 LAB — HEMOGLOBIN A1C
Est. average glucose Bld gHb Est-mCnc: 114 mg/dL
Hgb A1c MFr Bld: 5.6 % (ref 4.8–5.6)

## 2019-11-17 LAB — LIPID PANEL WITH LDL/HDL RATIO
Cholesterol, Total: 246 mg/dL — ABNORMAL HIGH (ref 100–199)
HDL: 60 mg/dL (ref >39)
LDL Chol Calc (NIH): 165 mg/dL — ABNORMAL HIGH (ref 0–99)
LDL/HDL Ratio: 2.8 ratio (ref 0.0–3.2)
Triglycerides: 119 mg/dL (ref 0–149)
VLDL Cholesterol Cal: 21 mg/dL (ref 5–40)

## 2019-11-17 LAB — VITAMIN D 25 HYDROXY (VIT D DEFICIENCY, FRACTURES): Vit D, 25-Hydroxy: 26.9 ng/mL — ABNORMAL LOW (ref 30.0–100.0)

## 2020-02-07 ENCOUNTER — Other Ambulatory Visit (INDEPENDENT_AMBULATORY_CARE_PROVIDER_SITE_OTHER): Payer: Self-pay | Admitting: Family Medicine

## 2020-02-07 DIAGNOSIS — I10 Essential (primary) hypertension: Secondary | ICD-10-CM

## 2020-02-07 DIAGNOSIS — E8881 Metabolic syndrome: Secondary | ICD-10-CM

## 2020-02-07 DIAGNOSIS — F3289 Other specified depressive episodes: Secondary | ICD-10-CM

## 2020-02-07 NOTE — Telephone Encounter (Signed)
NP [place]

## 2020-02-08 ENCOUNTER — Encounter (INDEPENDENT_AMBULATORY_CARE_PROVIDER_SITE_OTHER): Payer: Self-pay | Admitting: Family Medicine

## 2020-02-08 ENCOUNTER — Ambulatory Visit (INDEPENDENT_AMBULATORY_CARE_PROVIDER_SITE_OTHER): Payer: BLUE CROSS/BLUE SHIELD | Admitting: Family Medicine

## 2020-02-08 ENCOUNTER — Other Ambulatory Visit: Payer: Self-pay

## 2020-02-08 VITALS — BP 134/83 | HR 73 | Temp 98.3°F | Ht 64.0 in | Wt 226.0 lb

## 2020-02-08 DIAGNOSIS — E559 Vitamin D deficiency, unspecified: Secondary | ICD-10-CM | POA: Diagnosis not present

## 2020-02-08 DIAGNOSIS — Z6838 Body mass index (BMI) 38.0-38.9, adult: Secondary | ICD-10-CM

## 2020-02-08 DIAGNOSIS — F3289 Other specified depressive episodes: Secondary | ICD-10-CM | POA: Diagnosis not present

## 2020-02-08 NOTE — Progress Notes (Signed)
Chief Complaint:   OBESITY Theresa Mathews is here to discuss her progress with her obesity treatment plan along with follow-up of her obesity related diagnoses. Theresa Mathews is on the Category 2 Plan and states she is following her eating plan approximately 50% of the time. Theresa Mathews states she is doing 0 minutes 0 times per week.  Today's visit was #: 14 Starting weight: 220 lbs Starting date: 08/26/2017 Today's weight: 226 lbs Today's date: 02/08/2020 Total lbs lost to date: 0 Total lbs lost since last in-office visit: 2  Interim History: Theresa Mathews has had an 8 week hiatus from our office and she is down 2 lbs. She started a new job and she likes it. She is acclimating to a new routine and has been meal planning less than usual. She is now using her crock pot more. She denies excessive hunger, and she denies drinking sugar sweetened beverages. She has knee pain that is preventing exercise.  Subjective:   1. Vitamin D deficiency Theresa Mathews's Vit D level has decreased to 26.9 from 34.2. She often forgets to take her prescription Vit D.  2. Other depression, emotional eating Theresa Mathews notes stress eating has increased with her new job. She has not been to counseling due to insurance change.  Assessment/Plan:   1. Vitamin D deficiency  Irys agreed to continue taking prescription Vitamin D 50,000 IU every 3 days, and she will set an alarm to remember to take it.   2. Other depression, emotional eating . Theresa Mathews will continue bupropion, and she will check into restarting counseling.   3. Class 2 severe obesity with serious comorbidity and body mass index (BMI) of 38.0 to 38.9 in adult, unspecified obesity type [facility] Theresa Mathews is currently in the action stage of change. As such, her goal is to continue with weight loss efforts. She has agreed to the Category 2 Plan.   Handout was given today: Thanksgiving Tips.  Exercise goals: Consider [employer]. Take short walks, and use fitness bands that were  provided.  Behavioral modification strategies: decreasing simple carbohydrates, increasing water intake, meal planning and cooking strategies and holiday eating strategies .  Theresa Mathews has agreed to follow-up with our clinic in 3 weeks.   Objective:   Blood pressure 134/83, pulse 73, temperature 98.3 F (36.8 C), temperature source Oral, height 5\' 4"  (1.626 m), weight 226 lb (102.5 kg), SpO2 97 %. Body mass index is 38.79 kg/m.  General: Cooperative, alert, well developed, in no acute distress. HEENT: Conjunctivae and lids unremarkable. Cardiovascular: Regular rhythm.  Lungs: Normal work of breathing. Neurologic: No focal deficits.   Lab Results  Component Value Date   CREATININE 0.78 11/16/2019   BUN 13 11/16/2019   NA 141 11/16/2019   K 4.6 11/16/2019   CL 102 11/16/2019   CO2 28 11/16/2019   Lab Results  Component Value Date   ALT 17 11/16/2019   AST 16 11/16/2019   ALKPHOS 109 11/16/2019   BILITOT 0.3 11/16/2019   Lab Results  Component Value Date   HGBA1C 5.6 11/16/2019   HGBA1C 5.2 06/14/2019   HGBA1C 5.4 02/02/2019   HGBA1C 5.4 10/03/2018   HGBA1C 5.4 03/14/2018   Lab Results  Component Value Date   INSULIN 10.1 11/16/2019   INSULIN 11.0 06/14/2019   INSULIN 7.0 02/02/2019   INSULIN 9.5 03/14/2018   INSULIN 11.2 11/24/2017   Lab Results  Component Value Date   TSH 1.290 08/26/2017   Lab Results  Component Value Date   CHOL 246 (  H) 11/16/2019   HDL 60 11/16/2019   LDLCALC 165 (H) 11/16/2019   TRIG 119 11/16/2019   CHOLHDL 3.1 06/14/2019   Lab Results  Component Value Date   WBC 8.5 07/12/2017   HGB 13.7 07/12/2017   HCT 39.8 07/12/2017   MCV 88.9 07/12/2017   PLT 383.0 07/12/2017   No results found for: IRON, TIBC, FERRITIN  Attestation Statements:   Reviewed by clinician on day of visit: allergies, medications, problem list, medical history, surgical history, family history, social history, and previous encounter notes.   Wilhemena Durie, am acting as Location manager for [employer], FNP-C.  I have reviewed the above documentation for accuracy and completeness, and I agree with the above. -  Georgianne Fick, FNP

## 2020-02-08 NOTE — Progress Notes (Signed)
The 10-year ASCVD risk score [name], et al., 2013) is: 2.8%   Values used to calculate the score:     Age: 54 years     Sex: Female     Is Non-Hispanic African American: No     Diabetic: No     Tobacco smoker: No     Systolic Blood Pressure: 099 mmHg     Is BP treated: Yes     HDL Cholesterol: 60 mg/dL     Total Cholesterol: 246 mg/dL

## 2020-02-12 ENCOUNTER — Encounter (INDEPENDENT_AMBULATORY_CARE_PROVIDER_SITE_OTHER): Payer: Self-pay | Admitting: Family Medicine

## 2020-02-27 DIAGNOSIS — Z113 Encounter for screening for infections with a predominantly sexual mode of transmission: Secondary | ICD-10-CM | POA: Diagnosis not present

## 2020-02-29 ENCOUNTER — Ambulatory Visit (INDEPENDENT_AMBULATORY_CARE_PROVIDER_SITE_OTHER): Payer: BLUE CROSS/BLUE SHIELD | Admitting: Family Medicine

## 2020-02-29 ENCOUNTER — Other Ambulatory Visit: Payer: Self-pay

## 2020-02-29 ENCOUNTER — Encounter (INDEPENDENT_AMBULATORY_CARE_PROVIDER_SITE_OTHER): Payer: Self-pay | Admitting: Family Medicine

## 2020-02-29 VITALS — BP 123/81 | HR 75 | Temp 98.1°F | Ht 64.0 in | Wt 228.0 lb

## 2020-02-29 DIAGNOSIS — M25561 Pain in right knee: Secondary | ICD-10-CM

## 2020-02-29 DIAGNOSIS — Z6839 Body mass index (BMI) 39.0-39.9, adult: Secondary | ICD-10-CM

## 2020-02-29 DIAGNOSIS — M25551 Pain in right hip: Secondary | ICD-10-CM | POA: Diagnosis not present

## 2020-02-29 DIAGNOSIS — G8929 Other chronic pain: Secondary | ICD-10-CM | POA: Insufficient documentation

## 2020-02-29 DIAGNOSIS — F3289 Other specified depressive episodes: Secondary | ICD-10-CM

## 2020-02-29 DIAGNOSIS — M25562 Pain in left knee: Secondary | ICD-10-CM

## 2020-02-29 MED ORDER — WEGOVY 0.25 MG/0.5ML ~~LOC~~ SOAJ: 0.2500 mg | SUBCUTANEOUS | 0 refills | Status: DC

## 2020-02-29 NOTE — Progress Notes (Addendum)
Chief Complaint:   OBESITY Theresa Mathews is here to discuss her progress with her obesity treatment plan along with follow-up of her obesity related diagnoses. [name] is on the Category 2 Plan and states she is following her eating plan approximately 60% of the time. 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  Today's visit was #: 37 Starting weight: 220 lbs Starting date: 08/26/2017 Today's weight: 228 lbs Today's date: 02/29/2020 Total lbs lost to date: 0 Total lbs lost since last in-office visit: 0  Interim History: [name] notes being off plan over Thanksgiving.  She is up 2 pounds today.  She does not feel she is getting protein in.  She is meal planning for 3 days per week, but dinner does not go well the other days.   She has been on Saxenda in the past and lost 15 lbs.   Subjective:   1. Bilateral chronic knee pain Shehas had pain in both knees for the last year, and it has worsened over the past few months.  She is taking ibuprofen and using ice with some relief.  2. Right hip pain Her right hip "catches" and clicks daily with certain movements.  Hurts when it catches.  3. Other depression, emotional eating She is on bupropion 200 mg twice daily.  She is not always taking it as scheduled and has a history of poor compliance.  She says she feels a bit down. She has not re-established with her counselor. She says it is difficult due to her and her husband's frequent medical appointments. He has MS and she must take him to all appointments. Most of the work at home falls on her and she is always under quite a bit of stress.   Assessment/Plan:   1. Bilateral chronic knee pain Will refer to Dr. Mayer Camel at [facility] for evaluation.  2. Right hip pain Will refer to Orthopedics, as above.  3. Other depression, emotional eating Discussed strategies to take bupropion as scheduled.  4. Class 2 severe obesity with serious  comorbidity and body mass index (BMI) of 39.0 to 39.9 in adult, unspecified obesity type ([place])  -Start Semaglutide-Weight Management (WEGOVY) 0.25 MG/0.5ML SOAJ; Inject 0.25 mg into the skin once a week.  Dispense: 2 mL; Refill: 0  [name] is currently in the action stage of change. As such, her goal is to continue with weight loss efforts. She has agreed to the Category 2 Plan.   Exercise goals: As is.  Behavioral modification strategies: meal planning and cooking strategies.  [name] has agreed to follow-up with our clinic in 3 weeks.   Objective:   Blood pressure 123/81, pulse 75, temperature 98.1 F (36.7 C), height 5\' 4"  (1.626 m), weight 228 lb (103.4 kg), SpO2 97 %. Body mass index is 39.14 kg/m.  General: Cooperative, alert, well developed, in no acute distress. HEENT: Conjunctivae and lids unremarkable. Cardiovascular: Regular rhythm.  Lungs: Normal work of breathing. Neurologic: No focal deficits.   Lab Results  Component Value Date   CREATININE 0.78 11/16/2019   BUN 13 11/16/2019   NA 141 11/16/2019   K 4.6 11/16/2019   CL 102 11/16/2019   CO2 28 11/16/2019   Lab Results  Component Value Date   ALT 17 11/16/2019   AST 16 11/16/2019   ALKPHOS 109 11/16/2019   BILITOT 0.3 11/16/2019   Lab Results  Component Value Date   HGBA1C 5.6 11/16/2019   HGBA1C 5.2  06/14/2019   HGBA1C 5.4 02/02/2019   HGBA1C 5.4 10/03/2018   HGBA1C 5.4 03/14/2018   Lab Results  Component Value Date   INSULIN 10.1 11/16/2019   INSULIN 11.0 06/14/2019   INSULIN 7.0 02/02/2019   INSULIN 9.5 03/14/2018   INSULIN 11.2 11/24/2017   Lab Results  Component Value Date   TSH 1.290 08/26/2017   Lab Results  Component Value Date   CHOL 246 (H) 11/16/2019   HDL 60 11/16/2019   LDLCALC 165 (H) 11/16/2019   TRIG 119 11/16/2019   CHOLHDL 3.1 06/14/2019   Lab Results  Component Value Date   WBC 8.5 07/12/2017   HGB 13.7 07/12/2017   HCT 39.8 07/12/2017   MCV 88.9 07/12/2017   PLT  383.0 07/12/2017   Attestation Statements:   Reviewed by clinician on day of visit: allergies, medications, problem list, medical history, surgical history, family history, social history, and previous encounter notes.  I, Water quality scientist, CMA, am acting as Location manager for [employer], [place].  I have reviewed the above documentation for accuracy and completeness, and I agree with the above. -  Georgianne Fick, FNP

## 2020-03-07 ENCOUNTER — Other Ambulatory Visit: Payer: Self-pay | Admitting: Family Medicine

## 2020-03-07 DIAGNOSIS — Z1231 Encounter for screening mammogram for malignant neoplasm of breast: Secondary | ICD-10-CM

## 2020-03-15 DIAGNOSIS — D2372 Other benign neoplasm of skin of left lower limb, including hip: Secondary | ICD-10-CM | POA: Diagnosis not present

## 2020-03-15 DIAGNOSIS — D225 Melanocytic nevi of trunk: Secondary | ICD-10-CM | POA: Diagnosis not present

## 2020-03-15 DIAGNOSIS — D2371 Other benign neoplasm of skin of right lower limb, including hip: Secondary | ICD-10-CM | POA: Diagnosis not present

## 2020-03-15 DIAGNOSIS — L570 Actinic keratosis: Secondary | ICD-10-CM | POA: Diagnosis not present

## 2020-03-20 ENCOUNTER — Other Ambulatory Visit: Payer: Self-pay

## 2020-03-20 ENCOUNTER — Encounter (INDEPENDENT_AMBULATORY_CARE_PROVIDER_SITE_OTHER): Payer: Self-pay | Admitting: Family Medicine

## 2020-03-20 ENCOUNTER — Ambulatory Visit (INDEPENDENT_AMBULATORY_CARE_PROVIDER_SITE_OTHER): Payer: BLUE CROSS/BLUE SHIELD | Admitting: Family Medicine

## 2020-03-20 VITALS — BP 136/80 | HR 64 | Temp 98.0°F | Ht 64.0 in | Wt 229.0 lb

## 2020-03-20 DIAGNOSIS — Z6839 Body mass index (BMI) 39.0-39.9, adult: Secondary | ICD-10-CM | POA: Diagnosis not present

## 2020-03-20 DIAGNOSIS — F3289 Other specified depressive episodes: Secondary | ICD-10-CM | POA: Diagnosis not present

## 2020-03-20 DIAGNOSIS — I10 Essential (primary) hypertension: Secondary | ICD-10-CM | POA: Diagnosis not present

## 2020-03-20 MED ORDER — TELMISARTAN 80 MG PO TABS: 80.0000 mg | ORAL_TABLET | Freq: Every day | ORAL | 0 refills | Status: DC

## 2020-03-20 NOTE — Progress Notes (Signed)
Chief Complaint:   OBESITY Theresa Mathews is here to discuss her progress with her obesity treatment plan along with follow-up of her obesity related diagnoses. Theresa Mathews is on the Category 2 Plan and states she is following her eating plan approximately 60% of the time. 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  Today's visit was #: 11 Starting weight: 220 lbs Starting date: 08/26/2017 Today's weight: 229 lbs Today's date: 03/20/2020 Total lbs lost to date: 0 Total lbs lost since last in-office visit: 0  Interim History: Theresa Mathews had done well resisting temptation at work.  She says she feels she lacks motivation to do what she needs to do to take care of herself.  She would like to come less frequently due to co-pays.  Subjective:   1. Essential hypertension Well controlled with telmisartan.  BP Readings from Last 3 Encounters:  03/20/20 136/80  02/29/20 123/81  02/08/20 134/83   2. Other depression, emotional eating She is doing better with adherence to citalopram, but not totally consistent.  She is taking bupropion consistently.  She is not currently seeing a counselor, but knows she needs to. She  has a history of poor compliance with medication.   She has not re-established with her counselor. She says it is difficult due to her and her husband's frequent medical appointments. He has MS and she must take him to all appointments. Most of the work at home falls on her and she is always under quite a bit of stress.   Assessment/Plan:   1. Essential hypertension Refill telmisartan 80 mg daily, as per below.  -Refill telmisartan (MICARDIS) 80 MG tablet; Take 1 tablet (80 mg total) by mouth daily. Take one daily  Dispense: 90 tablet; Refill: 0  2. Other depression, emotional eating She will discuss depression with Dr. Etter Sjogren.  Consider asking for referral to Psychiatry.  Encouraged her to restart counseling.   3. Class 2 severe  obesity with serious comorbidity and body mass index (BMI) of 39.0 to 39.9 in adult, unspecified obesity type Theresa Mathews)  Theresa Mathews is currently in the action stage of change. As such, her goal is to continue with weight loss efforts. She has agreed to the Category 2 Plan.   Exercise goals: As is.  Behavioral modification strategies: increasing lean protein intake and decreasing simple carbohydrates.  Carsen has agreed to follow-up with our clinic in 8 weeks.   Objective:   Blood pressure 136/80, pulse 64, temperature 98 F (36.7 C), height 5\' 4"  (1.626 m), weight 229 lb (103.9 kg), SpO2 100 %. Body mass index is 39.31 kg/m.  General: Cooperative, alert, well developed, in no acute distress. HEENT: Conjunctivae and lids unremarkable. Cardiovascular: Regular rhythm.  Lungs: Normal work of breathing. Neurologic: No focal deficits.   Lab Results  Component Value Date   CREATININE 0.78 11/16/2019   BUN 13 11/16/2019   NA 141 11/16/2019   K 4.6 11/16/2019   CL 102 11/16/2019   CO2 28 11/16/2019   Lab Results  Component Value Date   ALT 17 11/16/2019   AST 16 11/16/2019   ALKPHOS 109 11/16/2019   BILITOT 0.3 11/16/2019   Lab Results  Component Value Date   HGBA1C 5.6 11/16/2019   HGBA1C 5.2 06/14/2019   HGBA1C 5.4 02/02/2019   HGBA1C 5.4 10/03/2018   HGBA1C 5.4 03/14/2018   Lab Results  Component Value Date   INSULIN 10.1 11/16/2019   INSULIN 11.0 06/14/2019  INSULIN 7.0 02/02/2019   INSULIN 9.5 03/14/2018   INSULIN 11.2 11/24/2017   Lab Results  Component Value Date   TSH 1.290 08/26/2017   Lab Results  Component Value Date   CHOL 246 (H) 11/16/2019   HDL 60 11/16/2019   LDLCALC 165 (H) 11/16/2019   TRIG 119 11/16/2019   CHOLHDL 3.1 06/14/2019   Lab Results  Component Value Date   WBC 8.5 07/12/2017   HGB 13.7 07/12/2017   HCT 39.8 07/12/2017   MCV 88.9 07/12/2017   PLT 383.0 07/12/2017   Attestation Statements:   Reviewed by clinician on day of visit:  allergies, medications, problem list, medical history, surgical history, family history, social history, and previous encounter notes.  I, Water quality scientist, CMA, am acting as Location manager for [employer], [place].  I have reviewed the above documentation for accuracy and completeness, and I agree with the above. -  Georgianne Fick, FNP

## 2020-03-27 DIAGNOSIS — Z1231 Encounter for screening mammogram for malignant neoplasm of breast: Secondary | ICD-10-CM

## 2020-04-12 ENCOUNTER — Encounter: Payer: BLUE CROSS/BLUE SHIELD | Admitting: Family Medicine

## 2020-04-17 DIAGNOSIS — Z1152 Encounter for screening for COVID-19: Secondary | ICD-10-CM | POA: Diagnosis not present

## 2020-04-22 ENCOUNTER — Telehealth (INDEPENDENT_AMBULATORY_CARE_PROVIDER_SITE_OTHER): Payer: BLUE CROSS/BLUE SHIELD | Admitting: Family Medicine

## 2020-04-22 ENCOUNTER — Other Ambulatory Visit: Payer: Self-pay

## 2020-04-22 ENCOUNTER — Telehealth: Payer: Self-pay

## 2020-04-22 ENCOUNTER — Encounter: Payer: Self-pay | Admitting: Family Medicine

## 2020-04-22 VITALS — Temp 99.5°F

## 2020-04-22 DIAGNOSIS — U071 COVID-19: Secondary | ICD-10-CM | POA: Diagnosis not present

## 2020-04-22 MED ORDER — BENZONATATE 100 MG PO CAPS: 100.0000 mg | ORAL_CAPSULE | Freq: Three times a day (TID) | ORAL | 0 refills | Status: DC | PRN

## 2020-04-22 MED ORDER — PREDNISONE 20 MG PO TABS: 40.0000 mg | ORAL_TABLET | Freq: Every day | ORAL | 0 refills | Status: AC

## 2020-04-22 NOTE — Progress Notes (Signed)
Chief Complaint  Patient presents with  . Covid Positive  . Sore Throat  . Cough  . Fever    Theresa Mathews here for URI complaints. Due to COVID-19 pandemic, we are interacting via web portal for an electronic face-to-face visit. I verified patient's ID using 2 identifiers. Patient agreed to proceed with visit via this method. Patient is at home, I am at office. Patient and I are present for visit.   Duration: 7 day  Associated symptoms: Fever (102.3 F), sore throat, myalgia and coughing, diarrhea Denies: sinus congestion, sinus pain, rhinorrhea, itchy watery eyes, ear pain, ear drainage, wheezing, shortness of breath and N/V Treatment to date: Mucinex, Tylenol Sick contacts: Yes- family members  Tested + for covid on 04/17/20 She is vaccinated [employer], did not yet get booster.   Past Medical History:  Diagnosis Date  . Anxiety   . Back pain   . Depression   . Fatigue   . GERD (gastroesophageal reflux disease)   . Lactose intolerance    Objective Temp 99.5 F (37.5 C) (Oral)  No conversational dyspnea Age appropriate judgment and insight Nml affect and mood  COVID-19 - Plan: predniSONE (DELTASONE) 20 MG tablet, benzonatate (TESSALON) 100 MG capsule  5 d pred burst 40 mg/d. Cough med prn.  Continue to push fluids, practice good hand hygiene, cover mouth when coughing. F/u prn. If starting to experience irreplaceable fluid loss, shaking, or shortness of breath, seek immediate care. Pt voiced understanding and agreement to the plan.  [place], DO 04/22/20 2:47 PM

## 2020-04-22 NOTE — Telephone Encounter (Signed)
Nurse Assessment Nurse: Laverta Baltimore, RN, Erin Date/Time (Eastern Time): 04/22/2020 7:26:04 AM Confirm and document reason for call. If symptomatic, describe symptoms. ---sted positive for Covid19 last week (Wed) and this morning the caller is having temp of 102.3(oral) and coughing. Does the patient have any new or worsening symptoms? ---Yes Will a triage be completed? ---Yes Related visit to physician within the last 2 weeks? ---No Does the PT have any chronic conditions? (i.e. diabetes, asthma, this includes High risk factors for pregnancy, etc.) ---Yes List chronic conditions. ---HTN Is the patient pregnant or possibly pregnant? (Ask all females between the ages of 57-55) ---No Is this a behavioral health or substance abuse call? ---No Guidelines Guideline Title Affirmed Question Affirmed Notes Nurse Date/Time (Eastern Time) COVID-19 - Diagnosed or Suspected Fever present > 3 days (72 hours) Long, RN, Erin 04/22/2020 7:27:20 AM Disp. Time Eilene Ghazi Time) Disposition Final User 04/22/2020 7:31:25 AM Call PCP within 24 Hours Yes Long, RN, Erin PLEASE NOTE: All timestamps contained within this report are represented as [place] Standard Time. CONFIDENTIALTY NOTICE: This fax transmission is intended only for the addressee. It contains information that is legally privileged, confidential or otherwise protected from use or disclosure. If you are not the intended recipient, you are strictly prohibited from reviewing, disclosing, copying using or disseminating any of this information or taking any action in reliance on or regarding this information. If you have received this fax in error, please notify us immediately by telephone so that we can arrange for its return to [name]. Phone: [phone], Toll-Free: [phone], Fax: [phone] Page: 2 of 2 Call Id: [id number] [place] Disagree/Comply Comply Caller Understands Yes PreDisposition Call Doctor Care Advice Given Per Guideline CALL PCP WITHIN 24  HOURS: * You need to discuss this with your doctor (or NP/PA) within the next 24 hours. GENERAL CARE ADVICE FOR COVID-19 SYMPTOMS: * Fever: For fever over 101 F (38.3 C), take acetaminophen every 4 to 6 hours (Adults 650 mg) OR ibuprofen every 6 to 8 hours (Adults 400 mg). Before taking any medicine, read all the instructions on the package. Do not take aspirin unless your doctor has prescribed it for you. * Sore throat: Try throat lozenges, hard candy or warm chicken broth. CALL BACK IF: * Fever over 103 F (39.4 C) * Chest pain or difficulty breathing occurs * You become worse Referrals REFERRED TO PCP OFFICE  Appt scheduled today w/ Dr. Nani Ravens

## 2020-05-06 ENCOUNTER — Ambulatory Visit
Admission: RE | Admit: 2020-05-06 | Discharge: 2020-05-06 | Disposition: A | Discharge status: Home / Self Care | Payer: BLUE CROSS/BLUE SHIELD | Source: Ambulatory Visit | Attending: Family Medicine | Admitting: Family Medicine

## 2020-05-06 ENCOUNTER — Other Ambulatory Visit: Payer: Self-pay

## 2020-05-06 DIAGNOSIS — Z1231 Encounter for screening mammogram for malignant neoplasm of breast: Secondary | ICD-10-CM | POA: Diagnosis not present

## 2020-05-15 ENCOUNTER — Ambulatory Visit (INDEPENDENT_AMBULATORY_CARE_PROVIDER_SITE_OTHER): Payer: BLUE CROSS/BLUE SHIELD | Admitting: Family Medicine

## 2020-05-15 ENCOUNTER — Encounter (INDEPENDENT_AMBULATORY_CARE_PROVIDER_SITE_OTHER): Payer: Self-pay | Admitting: Family Medicine

## 2020-05-15 ENCOUNTER — Other Ambulatory Visit: Payer: Self-pay

## 2020-05-15 VITALS — BP 143/78 | HR 65 | Temp 97.9°F | Ht 64.0 in | Wt 225.0 lb

## 2020-05-15 DIAGNOSIS — F3289 Other specified depressive episodes: Secondary | ICD-10-CM

## 2020-05-15 DIAGNOSIS — E88819 Insulin resistance, unspecified: Secondary | ICD-10-CM

## 2020-05-15 DIAGNOSIS — E8881 Metabolic syndrome: Secondary | ICD-10-CM

## 2020-05-15 DIAGNOSIS — Z6838 Body mass index (BMI) 38.0-38.9, adult: Secondary | ICD-10-CM

## 2020-05-15 DIAGNOSIS — Z9189 Other specified personal risk factors, not elsewhere classified: Secondary | ICD-10-CM

## 2020-05-15 MED ORDER — OZEMPIC (0.25 OR 0.5 MG/DOSE) 2 MG/1.5ML ~~LOC~~ SOPN: 0.2500 mg | PEN_INJECTOR | SUBCUTANEOUS | 0 refills | Status: DC

## 2020-05-16 ENCOUNTER — Encounter (INDEPENDENT_AMBULATORY_CARE_PROVIDER_SITE_OTHER): Payer: Self-pay

## 2020-05-16 NOTE — Progress Notes (Signed)
Chief Complaint:   OBESITY Theresa Mathews is here to discuss her progress with her obesity treatment plan along with follow-up of her obesity related diagnoses. [name] is on the Category 2 Plan and states she is following her eating plan approximately 70% of the time. [name] states she is walking and doing yoga 15-30 minutes 2 times per week.  Today's visit was #: 42 Starting weight: 220 lbs Starting date: 08/26/2017 Today's weight: 225 lbs Today's date: 05/15/2020 Total lbs lost to date: 0 Total lbs lost since last in-office visit: 4 lbs  Interim History: I prescribed Wegovy at last OV but it is not covered on [employer].  Her last OV was 03/20/2020. She is down 4 lbs today. She feels she does fairly well with protein intake. She knows she needs to drink more water and eat more vegetables.  Subjective:   1. Insulin resistance Pt notes some polyphagia. Her insurance does not cover [phone]. Her last fasting insulin level was 10.1 and A1c 5.6. She is on Metformin.  Lab Results  Component Value Date   INSULIN 10.1 11/16/2019   INSULIN 11.0 06/14/2019   INSULIN 7.0 02/02/2019   INSULIN 9.5 03/14/2018   INSULIN 11.2 11/24/2017   Lab Results  Component Value Date   HGBA1C 5.6 11/16/2019    2. Other depression, emotional eating Pt's compliance with Celexa is fairly consistent.. She often forgets her 2nd dose of Wellbutrin. She feels her is mood is "OK". She has been doing counseling through work which she feels is beneficial. 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  3. At risk for side effect of medication [name] is at risk for side effects of medication due to starting Ozempic.  Assessment/Plan:   1. Insulin resistance Mikayela will continue to work on weight loss, exercise, and decreasing simple carbohydrates to help decrease the risk of diabetes. [name] agreed to follow-up with [name] as directed to closely monitor her progress. Start Ozempic, as per  below.  - Semaglutide,0.25 or 0.5MG /DOS, (OZEMPIC, 0.25 OR 0.5 MG/DOSE,) 2 MG/1.5ML SOPN; Inject 0.25 mg into the skin once a week.  Dispense: 1 each; Refill: 0  2. Other depression, emotional eating Reduce dose of Wellbutrin to once daily. Continue Celexa daily.   3. At risk for side effect of medication [name] was given approximately 15 minutes of drug side effect counseling today.  We discussed side effect possibility and risk versus benefits. [name] agreed to the medication and will contact this office if these side effects are intolerable.  Repetitive spaced learning was employed today to elicit superior memory formation and behavioral change.  4. Class 2 severe obesity with serious comorbidity and body mass index (BMI) of 38.0 to 38.9 in adult, unspecified obesity type [name] is currently in the action stage of change. As such, her goal is to continue with weight loss efforts. She has agreed to the Category 2 Plan.   Exercise goals: Increase exercise to 3 days a week.  Behavioral modification strategies: increasing lean protein intake, increasing vegetables and increasing water intake.  [name] has agreed to follow-up with our clinic in 6 weeks, per pt request.   Objective:   Blood pressure (!) 143/78, pulse 65, temperature 97.9 F (36.6 C), height 5\' 4"  (1.626 m), weight 225 lb (102.1 kg), SpO2 96 %. Body mass index is 38.62 kg/m.  General: Cooperative, alert, well developed, in no acute distress. HEENT: Conjunctivae and lids unremarkable. Cardiovascular: Regular rhythm.  Lungs: Normal  work of breathing. Neurologic: No focal deficits.   Lab Results  Component Value Date   CREATININE 0.78 11/16/2019   BUN 13 11/16/2019   NA 141 11/16/2019   K 4.6 11/16/2019   CL 102 11/16/2019   CO2 28 11/16/2019   Lab Results  Component Value Date   ALT 17 11/16/2019   AST 16 11/16/2019   ALKPHOS 109 11/16/2019   BILITOT 0.3 11/16/2019   Lab Results  Component Value Date    HGBA1C 5.6 11/16/2019   HGBA1C 5.2 06/14/2019   HGBA1C 5.4 02/02/2019   HGBA1C 5.4 10/03/2018   HGBA1C 5.4 03/14/2018   Lab Results  Component Value Date   INSULIN 10.1 11/16/2019   INSULIN 11.0 06/14/2019   INSULIN 7.0 02/02/2019   INSULIN 9.5 03/14/2018   INSULIN 11.2 11/24/2017   Lab Results  Component Value Date   TSH 1.290 08/26/2017   Lab Results  Component Value Date   CHOL 246 (H) 11/16/2019   HDL 60 11/16/2019   LDLCALC 165 (H) 11/16/2019   TRIG 119 11/16/2019   CHOLHDL 3.1 06/14/2019   Lab Results  Component Value Date   WBC 8.5 07/12/2017   HGB 13.7 07/12/2017   HCT 39.8 07/12/2017   MCV 88.9 07/12/2017   PLT 383.0 07/12/2017   No results found for: IRON, TIBC, FERRITIN  Attestation Statements:   Reviewed by clinician on day of visit: allergies, medications, problem list, medical history, surgical history, family history, social history, and previous encounter notes.  Coral Ceo, am acting as Location manager for [employer], [place].  I have reviewed the above documentation for accuracy and completeness, and I agree with the above. -  Georgianne Fick, FNP

## 2020-05-18 ENCOUNTER — Encounter (INDEPENDENT_AMBULATORY_CARE_PROVIDER_SITE_OTHER): Payer: Self-pay | Admitting: Family Medicine

## 2020-06-13 DIAGNOSIS — L570 Actinic keratosis: Secondary | ICD-10-CM | POA: Diagnosis not present

## 2020-06-26 ENCOUNTER — Encounter (INDEPENDENT_AMBULATORY_CARE_PROVIDER_SITE_OTHER): Payer: Self-pay | Admitting: Family Medicine

## 2020-06-26 ENCOUNTER — Other Ambulatory Visit: Payer: Self-pay

## 2020-06-26 ENCOUNTER — Ambulatory Visit (INDEPENDENT_AMBULATORY_CARE_PROVIDER_SITE_OTHER): Payer: BLUE CROSS/BLUE SHIELD | Admitting: Family Medicine

## 2020-06-26 VITALS — BP 134/81 | HR 67 | Temp 97.5°F | Ht 64.0 in | Wt 225.0 lb

## 2020-06-26 DIAGNOSIS — E7849 Other hyperlipidemia: Secondary | ICD-10-CM | POA: Diagnosis not present

## 2020-06-26 DIAGNOSIS — E8881 Metabolic syndrome: Secondary | ICD-10-CM

## 2020-06-26 DIAGNOSIS — E88819 Insulin resistance, unspecified: Secondary | ICD-10-CM

## 2020-06-26 DIAGNOSIS — E559 Vitamin D deficiency, unspecified: Secondary | ICD-10-CM | POA: Diagnosis not present

## 2020-06-26 DIAGNOSIS — Z6837 Body mass index (BMI) 37.0-37.9, adult: Secondary | ICD-10-CM

## 2020-06-26 DIAGNOSIS — Z9189 Other specified personal risk factors, not elsewhere classified: Secondary | ICD-10-CM

## 2020-06-26 MED ORDER — OZEMPIC (0.25 OR 0.5 MG/DOSE) 2 MG/1.5ML ~~LOC~~ SOPN: 0.5000 mg | PEN_INJECTOR | SUBCUTANEOUS | 0 refills | Status: DC

## 2020-06-26 NOTE — Progress Notes (Signed)
The 10-year ASCVD risk score [name], et al., 2013) is: 3.1%   Values used to calculate the score:     Age: 55 years     Sex: Female     Is Non-Hispanic African American: No     Diabetic: No     Tobacco smoker: No     Systolic Blood Pressure: 863 mmHg     Is BP treated: Yes     HDL Cholesterol: 60 mg/dL     Total Cholesterol: 246 mg/dL

## 2020-06-27 LAB — VITAMIN D 25 HYDROXY (VIT D DEFICIENCY, FRACTURES): Vit D, 25-Hydroxy: 34.8 ng/mL (ref 30.0–100.0)

## 2020-06-27 LAB — COMPREHENSIVE METABOLIC PANEL
ALT: 16 IU/L (ref 0–32)
AST: 13 IU/L (ref 0–40)
Albumin/Globulin Ratio: 1.8 (ref 1.2–2.2)
Albumin: 4.6 g/dL (ref 3.8–4.9)
Alkaline Phosphatase: 99 IU/L (ref 44–121)
BUN/Creatinine Ratio: 18 (ref 9–23)
BUN: 15 mg/dL (ref 6–24)
Bilirubin Total: 0.4 mg/dL (ref 0.0–1.2)
CO2: 25 mmol/L (ref 20–29)
Calcium: 9.5 mg/dL (ref 8.7–10.2)
Chloride: 101 mmol/L (ref 96–106)
Creatinine, Ser: 0.83 mg/dL (ref 0.57–1.00)
Globulin, Total: 2.5 g/dL (ref 1.5–4.5)
Glucose: 89 mg/dL (ref 65–99)
Potassium: 4.9 mmol/L (ref 3.5–5.2)
Sodium: 141 mmol/L (ref 134–144)
Total Protein: 7.1 g/dL (ref 6.0–8.5)
eGFR: 84 mL/min/{1.73_m2} (ref >59)

## 2020-06-27 LAB — HEMOGLOBIN A1C
Est. average glucose Bld gHb Est-mCnc: 114 mg/dL
Hgb A1c MFr Bld: 5.6 % (ref 4.8–5.6)

## 2020-06-27 LAB — LIPID PANEL WITH LDL/HDL RATIO
Cholesterol, Total: 233 mg/dL — ABNORMAL HIGH (ref 100–199)
HDL: 53 mg/dL (ref >39)
LDL Chol Calc (NIH): 151 mg/dL — ABNORMAL HIGH (ref 0–99)
LDL/HDL Ratio: 2.8 ratio (ref 0.0–3.2)
Triglycerides: 160 mg/dL — ABNORMAL HIGH (ref 0–149)
VLDL Cholesterol Cal: 29 mg/dL (ref 5–40)

## 2020-06-27 LAB — INSULIN, RANDOM: INSULIN: 7.4 u[IU]/mL (ref 2.6–24.9)

## 2020-07-02 NOTE — Progress Notes (Signed)
Chief Complaint:   OBESITY Theresa Mathews is here to discuss her progress with her obesity treatment plan along with follow-up of her obesity related diagnoses. Theresa Mathews is on the Category 2 Plan and states she is following her eating plan approximately 60% of the time. Theresa Mathews states she is walking for 20-30 minutes 3 times per week.  Today's visit was #: 107 Starting weight: 220 lbs Starting date: 08/26/2017 Today's weight: 225 lbs Today's date: 06/26/2020 Total lbs lost to date: 0 Total lbs lost since last in-office visit: 0  Interim History: Theresa Mathews says she had lost some weight but then went on a trip and regained some.  She notes that she moving more and has added more exercise. She is starting to notice increased satiety with Ozempic. She is buying an air fryer.  Subjective:   1. Insulin resistance [name] started Ozempic at her last office visit. She notes some increased satiety. She denies nausea or constipation.   Lab Results  Component Value Date   INSULIN 7.4 06/26/2020   INSULIN 10.1 11/16/2019   INSULIN 11.0 06/14/2019   INSULIN 7.0 02/02/2019   INSULIN 9.5 03/14/2018   Lab Results  Component Value Date   HGBA1C 5.6 06/26/2020   2. Other hyperlipidemia Theresa Mathews's last LDL was elevated at 165, and triglycerides and HDL were within normal limits. Her ASCVD risk score is 3.1%. She is not on statin, and she denies a family history of coronary artery disease.   Lab Results  Component Value Date   ALT 16 06/26/2020   AST 13 06/26/2020   ALKPHOS 99 06/26/2020   BILITOT 0.4 06/26/2020   Lab Results  Component Value Date   CHOL 233 (H) 06/26/2020   HDL 53 06/26/2020   LDLCALC 151 (H) 06/26/2020   TRIG 160 (H) 06/26/2020   CHOLHDL 3.1 06/14/2019   3. Vitamin D deficiency Theresa Mathews is on prescription Vit D 2 times weekly. She has trouble remembering to take it 2 times per week. Last Vit D level was 26.9.  4. At risk for heart disease Theresa Mathews is at a higher than average risk  for cardiovascular disease due to obesity.   Assessment/Plan:   1. Insulin resistance Shantasia  Will increased her dose of Ozempic to 0.5 mg weekly. We will check labs today.  - Comprehensive metabolic panel - Hemoglobin A1c - Insulin, random - Semaglutide,0.25 or 0.5MG /DOS, (OZEMPIC, 0.25 OR 0.5 MG/DOSE,) 2 MG/1.5ML SOPN; Inject 0.5 mg into the skin once a week.  Dispense: 1 each; Refill: 0  2. Other hyperlipidemia Cardiovascular risk and specific lipid/LDL goals reviewed. We discussed several lifestyle modifications today. We will check labs today.  Counseling Intensive lifestyle modifications are the first line treatment for this issue. . Dietary changes: Increase soluble fiber. Decrease simple carbohydrates. . Exercise changes: Moderate to vigorous-intensity aerobic activity 150 minutes per week if tolerated. . Lipid-lowering medications: see documented in medical record.  - Lipid Panel With LDL/HDL Ratio  3. Vitamin D deficiency . Marquite agreed to continue taking prescription Vitamin D 50,000 IU every 3 days and will follow-up for routine testing of Vitamin D, at least 2-3 times per year to avoid over-replacement.  - VITAMIN D 25 Hydroxy (Vit-D Deficiency, Fractures)  4. At risk for heart disease [name] was given approximately 15 minutes of coronary artery disease prevention counseling today. She is 55 y.o. female and has risk factors for heart disease including obesity. We discussed intensive lifestyle modifications today with an emphasis on specific weight loss instructions  and strategies.   Repetitive spaced learning was employed today to elicit superior memory formation and behavioral change.  5. Obesity: BMI 22 Theresa Mathews is currently in the action stage of change. As such, her goal is to continue with weight loss efforts. She has agreed to the Category 2 Plan.   Exercise goals: As is.  Behavioral modification strategies: increasing lean protein intake and decreasing simple  carbohydrates.  Claritza has agreed to follow-up with our clinic in 6 weeks per her request.    Theresa Mathews was informed we would discuss her lab results at her next visit unless there is a critical issue that needs to be addressed sooner. Theresa Mathews agreed to keep her next visit at the agreed upon time to discuss these results.  Objective:   Blood pressure 134/81, pulse 67, temperature (!) 97.5 F (36.4 C), height 5\' 4"  (1.626 m), weight 225 lb (102.1 kg), SpO2 100 %. Body mass index is 38.62 kg/m.  General: Cooperative, alert, well developed, in no acute distress. HEENT: Conjunctivae and lids unremarkable. Cardiovascular: Regular rhythm.  Lungs: Normal work of breathing. Neurologic: No focal deficits.   Lab Results  Component Value Date   CREATININE 0.83 06/26/2020   BUN 15 06/26/2020   NA 141 06/26/2020   K 4.9 06/26/2020   CL 101 06/26/2020   CO2 25 06/26/2020   Lab Results  Component Value Date   ALT 16 06/26/2020   AST 13 06/26/2020   ALKPHOS 99 06/26/2020   BILITOT 0.4 06/26/2020   Lab Results  Component Value Date   HGBA1C 5.6 06/26/2020   HGBA1C 5.6 11/16/2019   HGBA1C 5.2 06/14/2019   HGBA1C 5.4 02/02/2019   HGBA1C 5.4 10/03/2018   Lab Results  Component Value Date   INSULIN 7.4 06/26/2020   INSULIN 10.1 11/16/2019   INSULIN 11.0 06/14/2019   INSULIN 7.0 02/02/2019   INSULIN 9.5 03/14/2018   Lab Results  Component Value Date   TSH 1.290 08/26/2017   Lab Results  Component Value Date   CHOL 233 (H) 06/26/2020   HDL 53 06/26/2020   LDLCALC 151 (H) 06/26/2020   TRIG 160 (H) 06/26/2020   CHOLHDL 3.1 06/14/2019   Lab Results  Component Value Date   WBC 8.5 07/12/2017   HGB 13.7 07/12/2017   HCT 39.8 07/12/2017   MCV 88.9 07/12/2017   PLT 383.0 07/12/2017   No results found for: IRON, TIBC, FERRITIN  Attestation Statements:   Reviewed by clinician on day of visit: allergies, medications, problem list, medical history, surgical history, family  history, social history, and previous encounter notes.   Wilhemena Durie, am acting as Location manager for [employer], FNP-C.  I have reviewed the above documentation for accuracy and completeness, and I agree with the above. -  Georgianne Fick, FNP

## 2020-07-04 ENCOUNTER — Encounter (INDEPENDENT_AMBULATORY_CARE_PROVIDER_SITE_OTHER): Payer: Self-pay | Admitting: Family Medicine

## 2020-07-19 ENCOUNTER — Encounter: Payer: BLUE CROSS/BLUE SHIELD | Admitting: Family Medicine

## 2020-07-19 IMAGING — MG DIGITAL SCREENING BILAT W/ TOMO W/ CAD
6 of 10 series · 6 of 30 positions shown · non-contrast
Comparison: Previous exam(s).

CLINICAL DATA: Screening.

EXAM:
DIGITAL SCREENING BILATERAL MAMMOGRAM WITH TOMO AND CAD

[L MLO synth-2D]
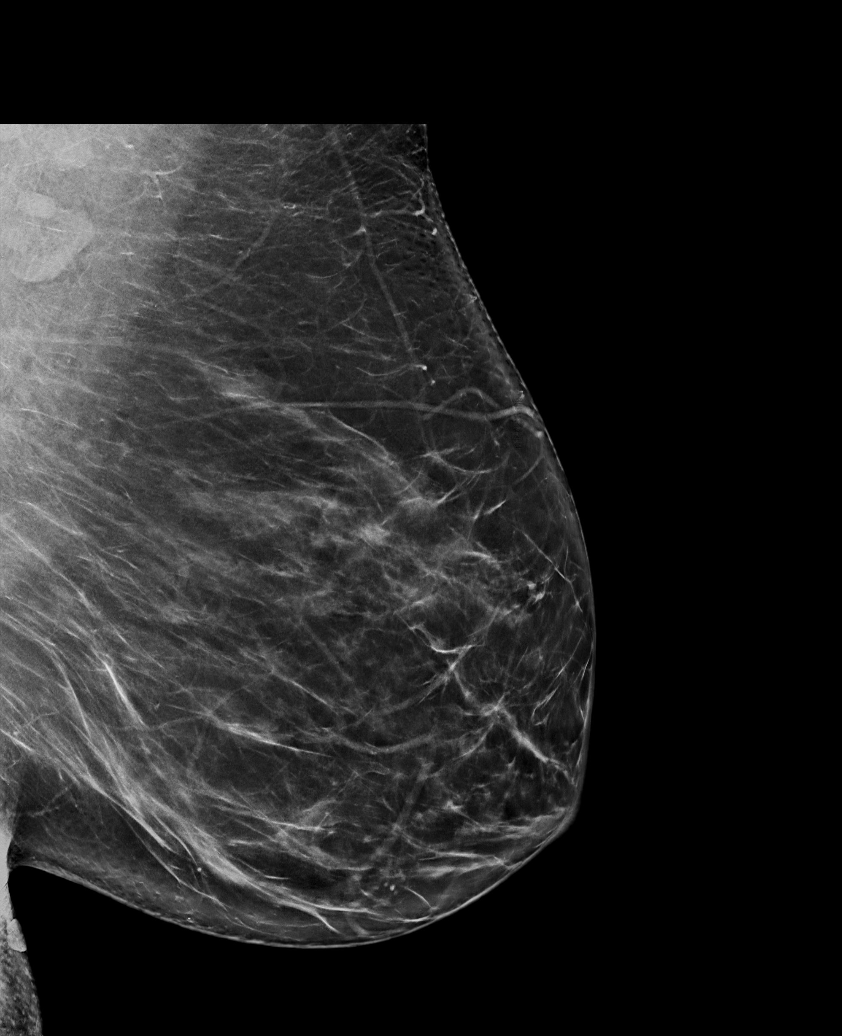

[R MLO synth-2D]
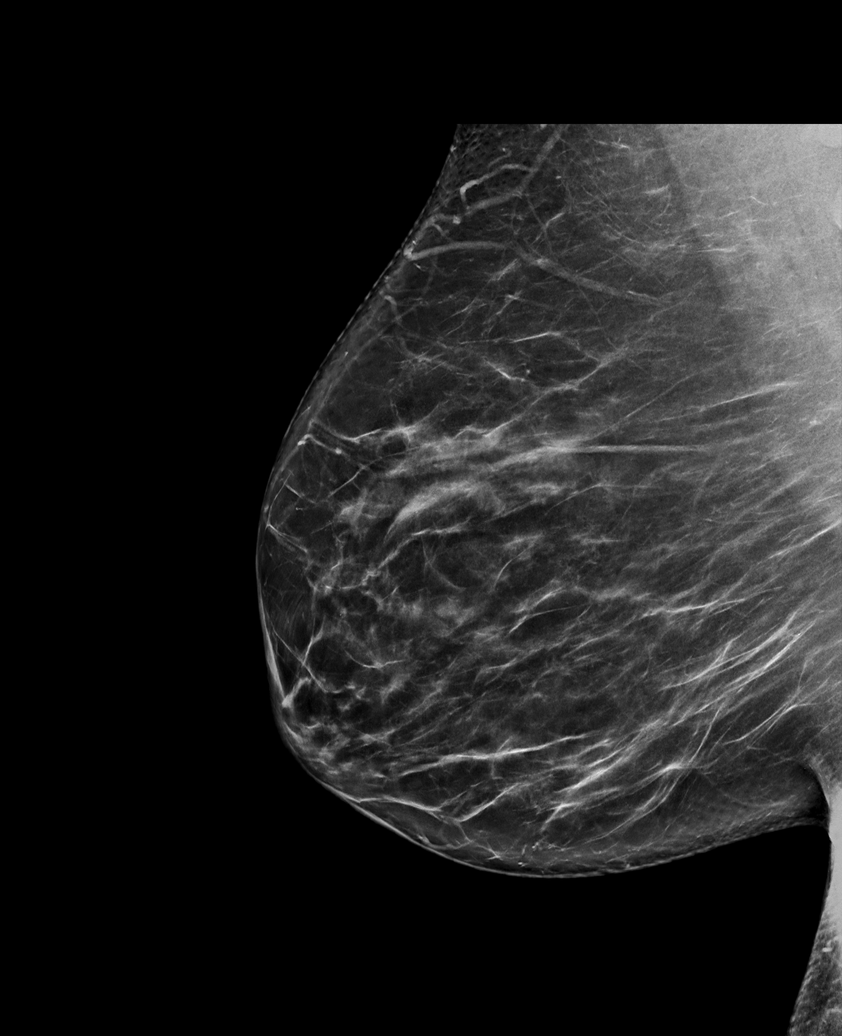

[R CC synth-2D]
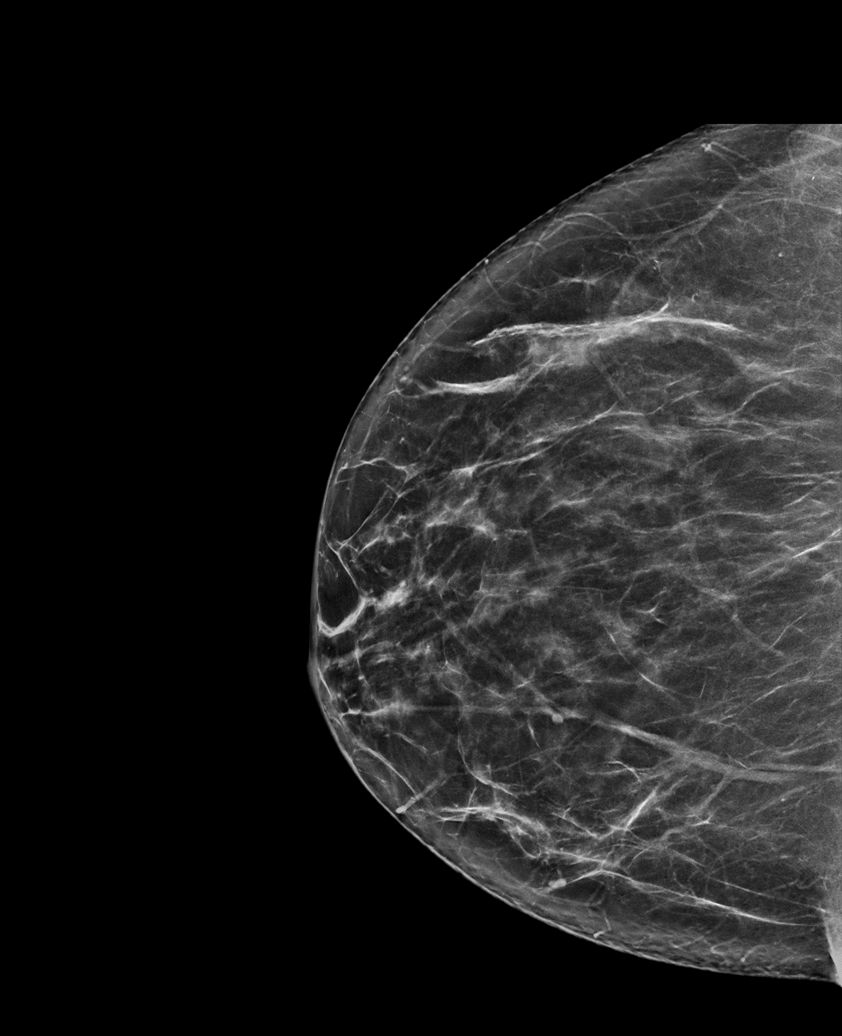

[L CC synth-2D (1 of 2)]
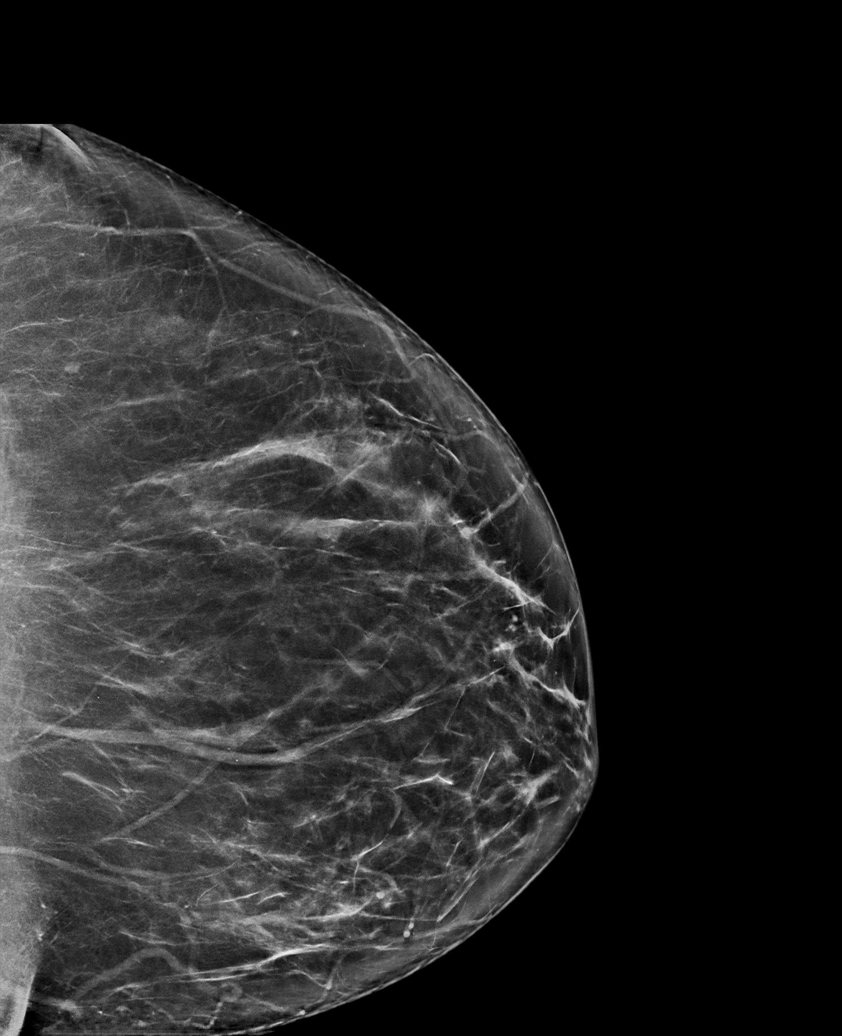

[L CC synth-2D (2 of 2)]
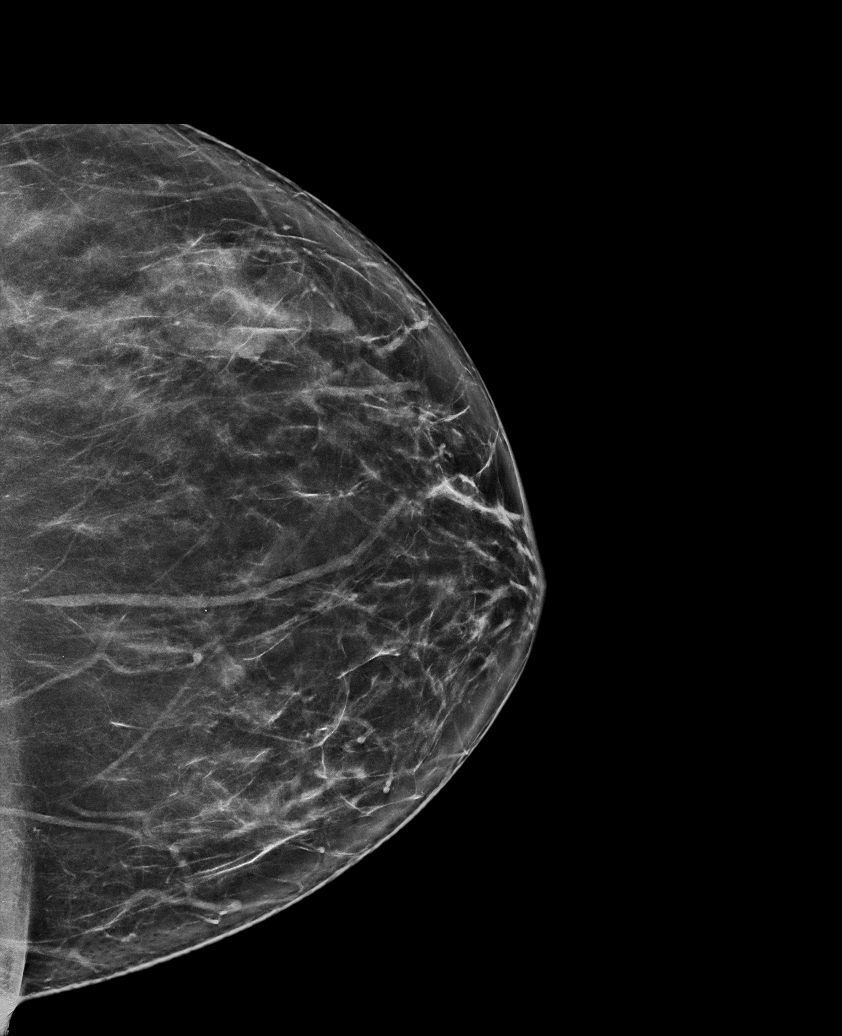

[L CC tomo · tomo slice 41/82.0]
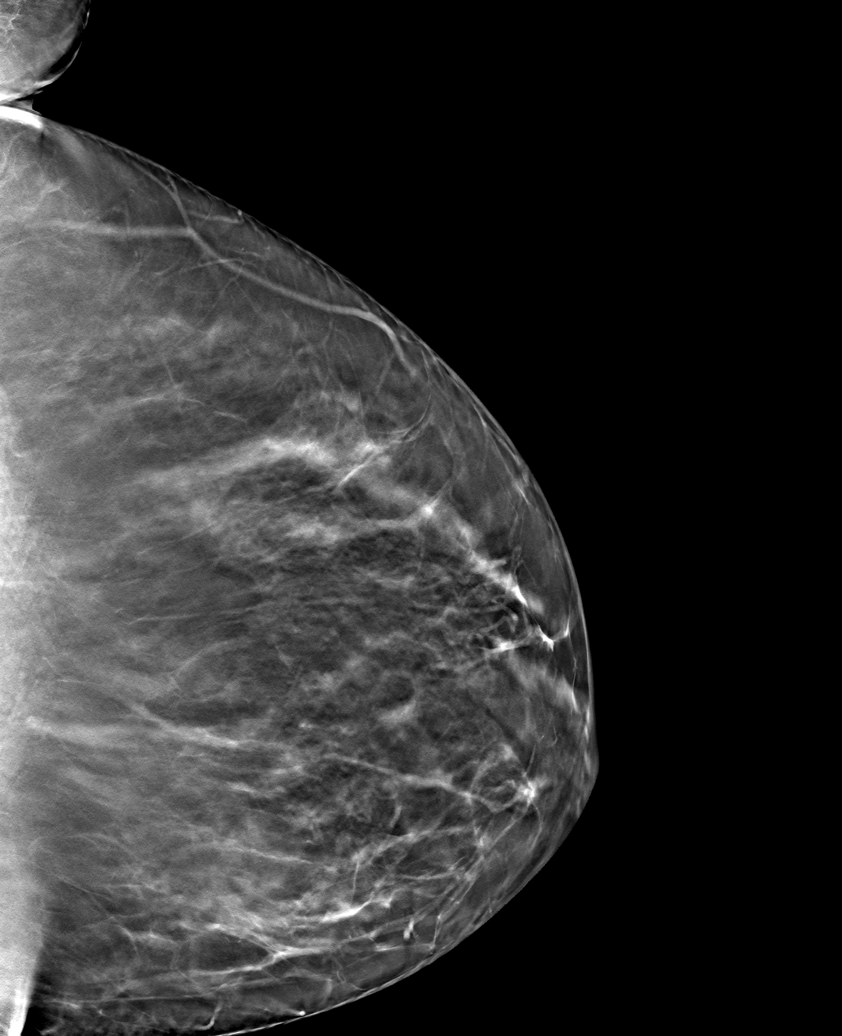

[6 of 30 positions shown; findings below may reference images not displayed]

ACR Breast Density Category b: There are scattered areas of
fibroglandular density.
FINDINGS: There are no findings suspicious for malignancy. Images were
processed with CAD.
IMPRESSION: No mammographic evidence of malignancy. A result letter of this
screening mammogram will be mailed directly to the patient.

RECOMMENDATION:
Screening mammogram in one year. (Code:[id number])

BI-RADS CATEGORY  1: Negative.

## 2020-07-30 ENCOUNTER — Encounter: Payer: BLUE CROSS/BLUE SHIELD | Admitting: Family Medicine

## 2020-08-06 ENCOUNTER — Encounter (INDEPENDENT_AMBULATORY_CARE_PROVIDER_SITE_OTHER): Payer: Self-pay

## 2020-08-07 ENCOUNTER — Telehealth (INDEPENDENT_AMBULATORY_CARE_PROVIDER_SITE_OTHER): Payer: BLUE CROSS/BLUE SHIELD | Admitting: Family Medicine

## 2020-09-16 ENCOUNTER — Ambulatory Visit (INDEPENDENT_AMBULATORY_CARE_PROVIDER_SITE_OTHER): Payer: No Typology Code available for payment source | Admitting: Family Medicine

## 2020-09-16 ENCOUNTER — Other Ambulatory Visit: Payer: Self-pay

## 2020-09-16 ENCOUNTER — Encounter: Payer: Self-pay | Admitting: Family Medicine

## 2020-09-16 VITALS — BP 134/78 | HR 65 | Temp 98.6°F | Resp 16 | Ht 64.0 in | Wt 232.8 lb

## 2020-09-16 DIAGNOSIS — Z0000 Encounter for general adult medical examination without abnormal findings: Secondary | ICD-10-CM

## 2020-09-16 DIAGNOSIS — Z6838 Body mass index (BMI) 38.0-38.9, adult: Secondary | ICD-10-CM

## 2020-09-16 DIAGNOSIS — Z23 Encounter for immunization: Secondary | ICD-10-CM

## 2020-09-16 DIAGNOSIS — G4700 Insomnia, unspecified: Secondary | ICD-10-CM | POA: Diagnosis not present

## 2020-09-16 DIAGNOSIS — I10 Essential (primary) hypertension: Secondary | ICD-10-CM

## 2020-09-16 DIAGNOSIS — R0683 Snoring: Secondary | ICD-10-CM

## 2020-09-16 DIAGNOSIS — E559 Vitamin D deficiency, unspecified: Secondary | ICD-10-CM

## 2020-09-16 DIAGNOSIS — E7849 Other hyperlipidemia: Secondary | ICD-10-CM

## 2020-09-16 NOTE — Progress Notes (Signed)
Patient ID: Theresa Mathews, female    DOB: Mar 04, 1966  Age: 55 y.o. MRN: [id number]    Subjective:  Subjective  HPI Theresa Mathews presents for a comprehensive physical examination today. She complains of bilateral knee pain. She notes that it worsen with walking up stair, however she doesn't experience pain while walking. She states that she walks up stair and walk, regularly as exercise. She describes the pain as burning and she hears pops. She report using capsaicin cream and it helps relieved her pain slightly. She endorses attending health and wellness, however she hasn't been attending due to [place]. She notes that she had lost weight, but she had gained her weight back.  Wt Readings from Last 3 Encounters:  09/16/20 232 lb 12.8 oz (105.6 kg)  06/26/20 225 lb (102.1 kg)  05/15/20 225 lb (102.1 kg)  She also complains of fatigue. She notes that she sometimes wake up at nighttime and is worry about having sleep apnea.  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  She states that she has been taking 1.25 mg of vitamin D3 PO weekly  She denies any chest pain, SOB, fever, abdominal pain, cough, chills, sore throat, dysuria, urinary incontinence, back pain, HA, or N/V/D at this time. Pt has a f/u with [place]. She notes that she has quit smoking sine 1994. Pt agrees to shingle vaccination and UTD with her dentist, dermatologist, and optometrist.   Review of Systems  Constitutional:  Negative for chills, fatigue and fever.  HENT:  Negative for ear pain, rhinorrhea, sinus pressure, sinus pain, sore throat and tinnitus.   Eyes:  Negative for pain.  Respiratory:  Negative for cough, shortness of breath and wheezing.   Cardiovascular:  Negative for chest pain.  Gastrointestinal:  Negative for abdominal pain, anal bleeding, constipation, diarrhea, nausea and vomiting.  Genitourinary:   Negative for flank pain.  Musculoskeletal:  Positive for arthralgias (bilateral Knee). Negative for back pain and neck pain.  Skin:  Negative for rash.  Neurological:  Negative for seizures, weakness, light-headedness, numbness and headaches.   History Past Medical History:  Diagnosis Date   Anxiety    Back pain    Depression    Fatigue    GERD (gastroesophageal reflux disease)    Lactose intolerance     She has a past surgical history that includes Dilation and curettage of uterus (2005); Robotic assisted total hysterectomy with bilateral salpingo oophorectomy (Bilateral, 01/23/2015); Bladder suspension (N/A, 01/23/2015); and Cystoscopy (N/A, 01/23/2015).   Her family history includes Diabetes in her father; Heart disease (age of onset: 75) in her mother; Hyperlipidemia in her father; Hypertension in her father and mother; Obesity in her father and mother; Ovarian cancer in her mother.She reports that she quit smoking about 28 years ago. Her smoking use included cigarettes. She has a 2.00 pack-year smoking history. She has never used smokeless tobacco. She reports current alcohol use of about 5.0 standard drinks of alcohol per week. She reports that she does not use drugs.  Current Outpatient Medications on File Prior to Visit  Medication Sig Dispense Refill   buPROPion (WELLBUTRIN SR) 200 MG 12 hr tablet Take 1 tablet (200 mg total) by mouth 2 (two) times daily. 180 tablet 0   Cholecalciferol (VITAMIN D3) 1.25 MG (50000 UT) CAPS Take 1 capsule by mouth every 3 (three) days. 10 capsule 0   citalopram (CELEXA) 20  MG tablet Take 1 tablet (20 mg total) by mouth daily. 90 tablet 0   metFORMIN (GLUCOPHAGE) 500 MG tablet Take 1 tablet (500 mg total) by mouth daily with breakfast. 90 tablet 0   ranitidine (ZANTAC) 75 MG tablet Take 75 mg by mouth daily at 6 (six) AM.      Semaglutide,0.25 or 0.5MG /DOS, (OZEMPIC, 0.25 OR 0.5 MG/DOSE,) 2 MG/1.5ML SOPN Inject 0.5 mg into the skin once a week. 1  each 0   telmisartan (MICARDIS) 80 MG tablet Take 1 tablet (80 mg total) by mouth daily. Take one daily 90 tablet 0   Current Facility-Administered Medications on File Prior to Visit  Medication Dose Route Frequency Provider Last Rate Last Admin   0.9 %  sodium chloride infusion  500 mL Intravenous Once [name], MD         Objective:  Objective  Physical Exam Vitals and nursing note reviewed.  Constitutional:      General: She is not in acute distress.    Appearance: Normal appearance. She is well-developed. She is not ill-appearing.  HENT:     Head: Normocephalic and atraumatic.     Right Ear: Tympanic membrane, ear canal and external ear normal.     Left Ear: Tympanic membrane, ear canal and external ear normal.     Nose: Nose normal.  Eyes:     General:        Right eye: No discharge.        Left eye: No discharge.     Extraocular Movements: Extraocular movements intact.     Pupils: Pupils are equal, round, and reactive to light.  Cardiovascular:     Rate and Rhythm: Normal rate and regular rhythm.     Pulses: Normal pulses.     Heart sounds: Normal heart sounds. No murmur heard.   No friction rub. No gallop.  Pulmonary:     Effort: Pulmonary effort is normal. No respiratory distress.     Breath sounds: Normal breath sounds. No stridor. No wheezing, rhonchi or rales.  Chest:     Chest wall: No tenderness.  Abdominal:     General: Bowel sounds are normal. There is no distension.     Palpations: Abdomen is soft. There is no mass.     Tenderness: no abdominal tenderness There is no guarding or rebound.     Hernia: No hernia is present.  Musculoskeletal:        General: Normal range of motion.     Cervical back: Normal range of motion and neck supple.     Right lower leg: No tenderness. No edema.     Left lower leg: No tenderness. No edema.     Comments: There is no tenderness present in the bilateral patella while at the office, however it is crepitus present.    Skin:    General: Skin is warm and dry.  Neurological:     Mental Status: She is alert and oriented to person, place, and time.  Psychiatric:        Behavior: Behavior normal.        Thought Content: Thought content normal.   BP 134/78 (BP Location: Right Arm, Patient Position: Sitting, Cuff Size: Large)   Pulse 65   Temp 98.6 F (37 C) (Oral)   Resp 16   Ht 5\' 4"  (1.626 m)   Wt 232 lb 12.8 oz (105.6 kg)   SpO2 98%   BMI 39.96 kg/m  Wt Readings from Last 3 Encounters:  09/16/20 232 lb 12.8 oz (105.6 kg)  06/26/20 225 lb (102.1 kg)  05/15/20 225 lb (102.1 kg)     Lab Results  Component Value Date   WBC 8.5 07/12/2017   HGB 13.7 07/12/2017   HCT 39.8 07/12/2017   PLT 383.0 07/12/2017   GLUCOSE 89 06/26/2020   CHOL 233 (H) 06/26/2020   TRIG 160 (H) 06/26/2020   HDL 53 06/26/2020   LDLCALC 151 (H) 06/26/2020   ALT 16 06/26/2020   AST 13 06/26/2020   NA 141 06/26/2020   K 4.9 06/26/2020   CL 101 06/26/2020   CREATININE 0.83 06/26/2020   BUN 15 06/26/2020   CO2 25 06/26/2020   TSH 1.290 08/26/2017   HGBA1C 5.6 06/26/2020    MM 3D SCREEN BREAST BILATERAL  Result Date: 05/07/2020 CLINICAL DATA:  Screening. EXAM: DIGITAL SCREENING BILATERAL MAMMOGRAM WITH TOMOSYNTHESIS AND CAD TECHNIQUE: Bilateral screening digital craniocaudal and mediolateral oblique mammograms were obtained. Bilateral screening digital breast tomosynthesis was performed. The images were evaluated with computer-aided detection. COMPARISON:  Previous exam(s). ACR Breast Density Category b: There are scattered areas of fibroglandular density. FINDINGS: There are no findings suspicious for malignancy. IMPRESSION: No mammographic evidence of malignancy. A result letter of this screening mammogram will be mailed directly to the patient. RECOMMENDATION: Screening mammogram in one year. (Code:SM-B-01Y) BI-RADS CATEGORY  1: Negative. Electronically Signed   By: Everlean Alstrom M.D.   On: 05/07/2020 12:31      Assessment & Plan:  Plan    No orders of the defined types were placed in this encounter.   Problem List Items Addressed This Visit   None Visit Diagnoses     Preventative health care    -  Primary   Need for shingles vaccine       Relevant Orders   Varicella-zoster vaccine IM (Completed)   Insomnia, unspecified type       Relevant Orders   Ambulatory referral to Neurology   Snoring       Relevant Orders   Ambulatory referral to Neurology        Colonoscopy: Last completed on 09/06/2017, small internal hemorrhoids noted , repeat every 10 years.   Mammo: Last completed on 05/06/2020, results were normal, repeat every 1 year.   Pap Smear: Last completed on 04/03/2016, results were normal, repeat every 3 years. Pt had a hysterectomy in 2016.   Follow-up: Return in about 1 year (around 09/16/2021), or if symptoms worsen or fail to improve, for fasting, annual exam.   I,Gordon Zheng,acting as a scribe for Ann Held, DO.,have documented all relevant documentation on the behalf of Ann Held, DO,as directed by  Ann Held, DO while in the presence of [place], DO, have reviewed all documentation for this visit. The documentation on 09/16/20 for the exam, diagnosis, procedures, and orders are all accurate and complete.

## 2020-09-16 NOTE — Assessment & Plan Note (Signed)
Well controlled, no changes to meds. Encouraged heart healthy diet such as the DASH diet and exercise as tolerated.  °

## 2020-09-16 NOTE — Patient Instructions (Signed)
Preventive Care 68-54 Years Old, Female Preventive care refers to lifestyle choices and visits with your health care provider that can promote health and wellness. This includes: A yearly physical exam. This is also called an annual wellness visit. Regular dental and eye exams. Immunizations. Screening for certain conditions. Healthy lifestyle choices, such as: Eating a healthy diet. Getting regular exercise. Not using drugs or products that contain nicotine and tobacco. Limiting alcohol use. What can I expect for my preventive care visit? Physical exam Your health care provider will check your: Height and weight. These may be used to calculate your BMI (body mass index). BMI is a measurement that tells if you are at a healthy weight. Heart rate and blood pressure. Body temperature. Skin for abnormal spots. Counseling Your health care provider may ask you questions about your: Past medical problems. Family's medical history. Alcohol, tobacco, and drug use. Emotional well-being. Home life and relationship well-being. Sexual activity. Diet, exercise, and sleep habits. Work and work Statistician. Access to firearms. Method of birth control. Menstrual cycle. Pregnancy history. What immunizations do I need?  Vaccines are usually given at various ages, according to a schedule. Your health care provider will recommend vaccines for you based on your age, medicalhistory, and lifestyle or other factors, such as travel or where you work. What tests do I need? Blood tests Lipid and cholesterol levels. These may be checked every 5 years, or more often if you are over 37 years old. Hepatitis C test. Hepatitis B test. Screening Lung cancer screening. You may have this screening every year starting at age 30 if you have a 30-pack-year history of smoking and currently smoke or have quit within the past 15 years. Colorectal cancer screening. All adults should have this screening starting at  age 23 and continuing until age 3. Your health care provider may recommend screening at age 88 if you are at increased risk. You will have tests every 1-10 years, depending on your results and the type of screening test. Diabetes screening. This is done by checking your blood sugar (glucose) after you have not eaten for a while (fasting). You may have this done every 1-3 years. Mammogram. This may be done every 1-2 years. Talk with your health care provider about when you should start having regular mammograms. This may depend on whether you have a family history of breast cancer. BRCA-related cancer screening. This may be done if you have a family history of breast, ovarian, tubal, or peritoneal cancers. Pelvic exam and Pap test. This may be done every 3 years starting at age 79. Starting at age 54, this may be done every 5 years if you have a Pap test in combination with an HPV test. Other tests STD (sexually transmitted disease) testing, if you are at risk. Bone density scan. This is done to screen for osteoporosis. You may have this scan if you are at high risk for osteoporosis. Talk with your health care provider about your test results, treatment options,and if necessary, the need for more tests. Follow these instructions at home: Eating and drinking  Eat a diet that includes fresh fruits and vegetables, whole grains, lean protein, and low-fat dairy products. Take vitamin and mineral supplements as recommended by your health care provider. Do not drink alcohol if: Your health care provider tells you not to drink. You are pregnant, may be pregnant, or are planning to become pregnant. If you drink alcohol: Limit how much you have to 0-1 drink a day. Be aware  of how much alcohol is in your drink. In the [place] one drink equals one 12 oz bottle of beer (355 mL), one 5 oz glass of wine (148 mL), or one 1 oz glass of hard liquor (44 mL).  Lifestyle Take daily care of your teeth and  gums. Brush your teeth every morning and night with fluoride toothpaste. Floss one time each day. Stay active. Exercise for at least 30 minutes 5 or more days each week. Do not use any products that contain nicotine or tobacco, such as cigarettes, e-cigarettes, and chewing tobacco. If you need help quitting, ask your health care provider. Do not use drugs. If you are sexually active, practice safe sex. Use a condom or other form of protection to prevent STIs (sexually transmitted infections). If you do not wish to become pregnant, use a form of birth control. If you plan to become pregnant, see your health care provider for a prepregnancy visit. If told by your health care provider, take low-dose aspirin daily starting at age 29. Find healthy ways to cope with stress, such as: Meditation, yoga, or listening to music. Journaling. Talking to a trusted person. Spending time with friends and family. Safety Always wear your seat belt while driving or riding in a vehicle. Do not drive: If you have been drinking alcohol. Do not ride with someone who has been drinking. When you are tired or distracted. While texting. Wear a helmet and other protective equipment during sports activities. If you have firearms in your house, make sure you follow all gun safety procedures. What's next? Visit your health care provider once a year for an annual wellness visit. Ask your health care provider how often you should have your eyes and teeth checked. Stay up to date on all vaccines. This information is not intended to replace advice given to you by your health care provider. Make sure you discuss any questions you have with your healthcare provider. Document Revised: 12/19/2019 Document Reviewed: 11/25/2017 Elsevier Patient Education  2022 [employer].

## 2020-09-16 NOTE — Assessment & Plan Note (Signed)
con't rx and add vita d 3 1000-2000 u daily

## 2020-09-16 NOTE — Assessment & Plan Note (Signed)
Pt will go back to [facility] 

## 2020-09-16 NOTE — Assessment & Plan Note (Signed)
Encourage heart healthy diet such as MIND or DASH diet, increase exercise, avoid trans fats, simple carbohydrates and processed foods, consider a krill or fish or flaxseed oil cap daily.  °

## 2020-09-24 ENCOUNTER — Other Ambulatory Visit: Payer: Self-pay

## 2020-09-24 ENCOUNTER — Ambulatory Visit (INDEPENDENT_AMBULATORY_CARE_PROVIDER_SITE_OTHER): Payer: No Typology Code available for payment source | Admitting: Family Medicine

## 2020-09-24 ENCOUNTER — Encounter (INDEPENDENT_AMBULATORY_CARE_PROVIDER_SITE_OTHER): Payer: Self-pay | Admitting: Family Medicine

## 2020-09-24 VITALS — BP 131/82 | HR 72 | Temp 98.2°F | Ht 64.0 in | Wt 227.0 lb

## 2020-09-24 DIAGNOSIS — Z6837 Body mass index (BMI) 37.0-37.9, adult: Secondary | ICD-10-CM | POA: Diagnosis not present

## 2020-09-24 DIAGNOSIS — I10 Essential (primary) hypertension: Secondary | ICD-10-CM

## 2020-09-24 DIAGNOSIS — E8881 Metabolic syndrome: Secondary | ICD-10-CM | POA: Diagnosis not present

## 2020-09-24 DIAGNOSIS — Z9189 Other specified personal risk factors, not elsewhere classified: Secondary | ICD-10-CM

## 2020-09-24 MED ORDER — SEMAGLUTIDE (1 MG/DOSE) 4 MG/3ML ~~LOC~~ SOPN: 1.0000 mg | PEN_INJECTOR | SUBCUTANEOUS | 0 refills | Status: DC

## 2020-09-26 NOTE — Progress Notes (Signed)
Chief Complaint:   OBESITY Theresa Mathews is here to discuss her progress with her obesity treatment plan along with follow-up of her obesity related diagnoses. Theresa Mathews is on the Category 2 Plan and states she is following her eating plan approximately 60% of the time. Theresa Mathews states she is doing 0 minutes 0 times per week.  Today's visit was #: 60 Starting weight: 220 lbs Starting date: 08/26/2017 Today's weight: 227 lbs Today's date: 09/24/2020 Total lbs lost to date: 0 Total lbs lost since last in-office visit: 0  Interim History: Theresa Mathews notes she was up 10 lbs but she has lost 8 lbs of it. She was eating out a lot and stress eating. She is working on getting back on the plan. She notes work is calming down. She recently started a new job. She has an upcoming appointment with sleep medicine.  Subjective:   1. Insulin resistance Dru is on 0.5 mg of Ozempic, and she is unsure if it is helping with her appetite. She denies nausea or constipation. Lab Results  Component Value Date   INSULIN 7.4 06/26/2020   INSULIN 10.1 11/16/2019   INSULIN 11.0 06/14/2019   INSULIN 7.0 02/02/2019   INSULIN 9.5 03/14/2018   Lab Results  Component Value Date   HGBA1C 5.6 06/26/2020   2. Essential hypertension Lori's blood pressure is well controlled. She admits to not always taking her medications as directed. She is on Micardis.  BP Readings from Last 3 Encounters:  09/24/20 131/82  09/16/20 134/78  06/26/20 134/81   Lab Results  Component Value Date   CREATININE 0.83 06/26/2020   CREATININE 0.78 11/16/2019   CREATININE 1.01 (H) 06/14/2019   3. At risk for medication nonadherence Kathaleya is at a higher than average risk for medication non-adherence due to previous patterns of med non-compliance.  Assessment/Plan:   1. Insulin resistance Theresa Mathews agreed to increase Ozempic to 1 mg q weekly with no refills.   - Semaglutide, 1 MG/DOSE, 4 MG/3ML SOPN; Inject 1 mg into the skin once a  week.  Dispense: 3 mL; Refill: 0  2. Essential hypertension Theresa Mathews will continue Micardis.  3. At risk for medication nonadherence Theresa Mathews was given approximately 15 minutes of counseling today to help her avoid medication non-adherence.  We discussed importance of taking medications at a similar time each day and the use of daily pill organizers to help improve medication adherence.  Repetitive spaced learning was employed today to elicit superior memory formation and behavioral change.  4. Obesity: BMI 38.95 Theresa Mathews is currently in the action stage of change. As such, her goal is to continue with weight loss efforts. She has agreed to the Category 2 Plan.   Exercise goals: No exercise has been prescribed at this time.  Behavioral modification strategies: increasing lean protein intake and decreasing simple carbohydrates.  Theresa Mathews has agreed to follow-up with our clinic in 4 weeks.  Objective:   Blood pressure 131/82, pulse 72, temperature 98.2 F (36.8 C), height 5\' 4"  (1.626 m), weight 227 lb (103 kg), SpO2 97 %. Body mass index is 38.96 kg/m.  General: Cooperative, alert, well developed, in no acute distress. HEENT: Conjunctivae and lids unremarkable. Cardiovascular: Regular rhythm.  Lungs: Normal work of breathing. Neurologic: No focal deficits.   Lab Results  Component Value Date   CREATININE 0.83 06/26/2020   BUN 15 06/26/2020   NA 141 06/26/2020   K 4.9 06/26/2020   CL 101 06/26/2020   CO2 25 06/26/2020   Lab  Results  Component Value Date   ALT 16 06/26/2020   AST 13 06/26/2020   ALKPHOS 99 06/26/2020   BILITOT 0.4 06/26/2020   Lab Results  Component Value Date   HGBA1C 5.6 06/26/2020   HGBA1C 5.6 11/16/2019   HGBA1C 5.2 06/14/2019   HGBA1C 5.4 02/02/2019   HGBA1C 5.4 10/03/2018   Lab Results  Component Value Date   INSULIN 7.4 06/26/2020   INSULIN 10.1 11/16/2019   INSULIN 11.0 06/14/2019   INSULIN 7.0 02/02/2019   INSULIN 9.5 03/14/2018   Lab  Results  Component Value Date   TSH 1.290 08/26/2017   Lab Results  Component Value Date   CHOL 233 (H) 06/26/2020   HDL 53 06/26/2020   LDLCALC 151 (H) 06/26/2020   TRIG 160 (H) 06/26/2020   CHOLHDL 3.1 06/14/2019   Lab Results  Component Value Date   VD25OH 34.8 06/26/2020   VD25OH 26.9 (L) 11/16/2019   VD25OH 34.2 06/14/2019   Lab Results  Component Value Date   WBC 8.5 07/12/2017   HGB 13.7 07/12/2017   HCT 39.8 07/12/2017   MCV 88.9 07/12/2017   PLT 383.0 07/12/2017   No results found for: IRON, TIBC, FERRITIN  Attestation Statements:   Reviewed by clinician on day of visit: allergies, medications, problem list, medical history, surgical history, family history, social history, and previous encounter notes.   Wilhemena Durie, am acting as Location manager for [employer], FNP-C.  I have reviewed the above documentation for accuracy and completeness, and I agree with the above. -  Georgianne Fick, FNP

## 2020-09-28 ENCOUNTER — Other Ambulatory Visit (INDEPENDENT_AMBULATORY_CARE_PROVIDER_SITE_OTHER): Payer: Self-pay | Admitting: Family Medicine

## 2020-09-28 DIAGNOSIS — I10 Essential (primary) hypertension: Secondary | ICD-10-CM

## 2020-09-30 ENCOUNTER — Encounter (INDEPENDENT_AMBULATORY_CARE_PROVIDER_SITE_OTHER): Payer: Self-pay | Admitting: Family Medicine

## 2020-10-01 NOTE — Telephone Encounter (Signed)
Last Ov with [facility]

## 2020-10-01 NOTE — Telephone Encounter (Signed)
Pt last seen by Dawn Whitmire, FNP.  

## 2020-10-01 NOTE — Telephone Encounter (Signed)
Refill request Lov:  09/24/20 NV:  10/22/20  Last b/p readings: 09/24/20:  131/82 06/26/20:  134/81

## 2020-10-02 ENCOUNTER — Telehealth (INDEPENDENT_AMBULATORY_CARE_PROVIDER_SITE_OTHER): Payer: Self-pay

## 2020-10-02 NOTE — Telephone Encounter (Signed)
Prior Auth has been submitted for Ozempic Waiting for response

## 2020-10-03 ENCOUNTER — Encounter (INDEPENDENT_AMBULATORY_CARE_PROVIDER_SITE_OTHER): Payer: Self-pay | Admitting: Family Medicine

## 2020-10-03 NOTE — Telephone Encounter (Signed)
Fax from [employer]:  prior auth for ozempic has been denied.

## 2020-10-07 NOTE — Telephone Encounter (Signed)
Please review

## 2020-10-17 ENCOUNTER — Encounter (INDEPENDENT_AMBULATORY_CARE_PROVIDER_SITE_OTHER): Payer: Self-pay | Admitting: Family Medicine

## 2020-10-22 ENCOUNTER — Ambulatory Visit (INDEPENDENT_AMBULATORY_CARE_PROVIDER_SITE_OTHER): Payer: No Typology Code available for payment source | Admitting: Family Medicine

## 2020-11-05 ENCOUNTER — Ambulatory Visit (INDEPENDENT_AMBULATORY_CARE_PROVIDER_SITE_OTHER): Payer: No Typology Code available for payment source | Admitting: Family Medicine

## 2020-11-05 ENCOUNTER — Encounter (INDEPENDENT_AMBULATORY_CARE_PROVIDER_SITE_OTHER): Payer: Self-pay

## 2020-11-05 ENCOUNTER — Other Ambulatory Visit: Payer: Self-pay

## 2020-11-05 ENCOUNTER — Encounter (INDEPENDENT_AMBULATORY_CARE_PROVIDER_SITE_OTHER): Payer: Self-pay | Admitting: Family Medicine

## 2020-11-05 VITALS — BP 138/84 | HR 66 | Temp 97.7°F | Ht 64.0 in | Wt 233.0 lb

## 2020-11-05 DIAGNOSIS — Z6837 Body mass index (BMI) 37.0-37.9, adult: Secondary | ICD-10-CM | POA: Diagnosis not present

## 2020-11-05 DIAGNOSIS — Z9189 Other specified personal risk factors, not elsewhere classified: Secondary | ICD-10-CM | POA: Diagnosis not present

## 2020-11-05 DIAGNOSIS — F3289 Other specified depressive episodes: Secondary | ICD-10-CM | POA: Diagnosis not present

## 2020-11-05 DIAGNOSIS — E8881 Metabolic syndrome: Secondary | ICD-10-CM

## 2020-11-05 DIAGNOSIS — E88819 Insulin resistance, unspecified: Secondary | ICD-10-CM

## 2020-11-05 MED ORDER — TIRZEPATIDE 2.5 MG/0.5ML ~~LOC~~ SOAJ: 2.5000 mg | SUBCUTANEOUS | 0 refills | Status: DC

## 2020-11-05 NOTE — Progress Notes (Signed)
Chief Complaint:   OBESITY Theresa Mathews is here to discuss her progress with her obesity treatment plan along with follow-up of her obesity related diagnoses. Theresa Mathews is on the Category 2 Plan and states she is following her eating plan approximately 50% of the time. Theresa Mathews states she is walking for 30 minutes 4 times per week.  Today's visit was #: 57 Starting weight: 220 lbs Starting date: 08/26/2017 Today's weight: 233 lbs Today's date: 11/05/2020 Total lbs lost to date: 0 Total lbs lost since last in-office visit: 0  Interim History: Theresa Mathews says her insurance does not cover weight loss drugs or Ozempic. She is very frustrated with her lack of progress and has been thinking about quitting our program. She has been on vacation and not on plan.  Subjective:   1. Insulin resistance Theresa Mathews notes polyphagia. We are unable to get GLP-1's covered by insurance.  Lab Results  Component Value Date   INSULIN 7.4 06/26/2020   INSULIN 10.1 11/16/2019   INSULIN 11.0 06/14/2019   INSULIN 7.0 02/02/2019   INSULIN 9.5 03/14/2018   Lab Results  Component Value Date   HGBA1C 5.6 06/26/2020    2. Other depression, emotional eating Theresa Mathews still notes having some emotional eating sporadically depending on situation. She is on bupropion.  3. At risk for side effect of medication Theresa Mathews is at risk for side effect of medication due to the start of Mounjaro.  Assessment/Plan:   1. Insulin resistance Theresa Mathews agrees to start Mounjaro 2.5 mg weekly with no refills. $25/month coupon provided. Theresa Mathews agreed to follow-up with [name] as directed to closely monitor her progress.  - tirzepatide [facility]) 2.5 MG/0.5ML Pen; Inject 2.5 mg into the skin once a week.  Dispense: 2 mL; Refill: 0  2. Other depression, emotional eating Theresa Mathews will continue bupropion. Behavior modification techniques were discussed today to help Theresa Mathews deal with her emotional/non-hunger eating behaviors.  Orders and follow up as  documented in patient record.    3. At risk for side effect of medication Theresa Mathews was given approximately 15 minutes of drug side effect counseling today.  We discussed side effect possibility and risk versus benefits. Theresa Mathews agreed to the medication and will contact this office if these side effects are intolerable.  Repetitive spaced learning was employed today to elicit superior memory formation and behavioral change.   4. Obesity: BMI 39.97 Theresa Mathews is currently in the action stage of change. As such, her goal is to continue with weight loss efforts. She has agreed to the Category 2 Plan.   Exercise goals:  As is.  Behavioral modification strategies: increasing lean protein intake and decreasing simple carbohydrates.  Theresa Mathews has agreed to follow-up with our clinic in 3 weeks.  Objective:   Blood pressure 138/84, pulse 66, temperature 97.7 F (36.5 C), height '5\' 4"'$  (1.626 m), weight 233 lb (105.7 kg), SpO2 98 %. Body mass index is 39.99 kg/m.  General: Cooperative, alert, well developed, in no acute distress. HEENT: Conjunctivae and lids unremarkable. Cardiovascular: Regular rhythm.  Lungs: Normal work of breathing. Neurologic: No focal deficits.   Lab Results  Component Value Date   CREATININE 0.83 06/26/2020   BUN 15 06/26/2020   NA 141 06/26/2020   K 4.9 06/26/2020   CL 101 06/26/2020   CO2 25 06/26/2020   Lab Results  Component Value Date   ALT 16 06/26/2020   AST 13 06/26/2020   ALKPHOS 99 06/26/2020   BILITOT 0.4 06/26/2020   Lab Results  Component Value Date   HGBA1C 5.6 06/26/2020   HGBA1C 5.6 11/16/2019   HGBA1C 5.2 06/14/2019   HGBA1C 5.4 02/02/2019   HGBA1C 5.4 10/03/2018   Lab Results  Component Value Date   INSULIN 7.4 06/26/2020   INSULIN 10.1 11/16/2019   INSULIN 11.0 06/14/2019   INSULIN 7.0 02/02/2019   INSULIN 9.5 03/14/2018   Lab Results  Component Value Date   TSH 1.290 08/26/2017   Lab Results  Component Value Date   CHOL 233 (H)  06/26/2020   HDL 53 06/26/2020   LDLCALC 151 (H) 06/26/2020   TRIG 160 (H) 06/26/2020   CHOLHDL 3.1 06/14/2019   Lab Results  Component Value Date   VD25OH 34.8 06/26/2020   VD25OH 26.9 (L) 11/16/2019   VD25OH 34.2 06/14/2019   Lab Results  Component Value Date   WBC 8.5 07/12/2017   HGB 13.7 07/12/2017   HCT 39.8 07/12/2017   MCV 88.9 07/12/2017   PLT 383.0 07/12/2017   No results found for: IRON, TIBC, FERRITIN   Attestation Statements:   Reviewed by clinician on day of visit: allergies, medications, problem list, medical history, surgical history, family history, social history, and previous encounter notes.  I, Lizbeth Bark, RMA, am acting as Location manager for [employer], [place].   I have reviewed the above documentation for accuracy and completeness, and I agree with the above. -  Georgianne Fick, FNP

## 2020-11-18 ENCOUNTER — Institutional Professional Consult (permissible substitution): Payer: No Typology Code available for payment source | Admitting: Neurology

## 2020-12-04 ENCOUNTER — Encounter (INDEPENDENT_AMBULATORY_CARE_PROVIDER_SITE_OTHER): Payer: Self-pay | Admitting: Family Medicine

## 2020-12-04 ENCOUNTER — Ambulatory Visit (INDEPENDENT_AMBULATORY_CARE_PROVIDER_SITE_OTHER): Payer: No Typology Code available for payment source | Admitting: Family Medicine

## 2020-12-04 ENCOUNTER — Other Ambulatory Visit: Payer: Self-pay

## 2020-12-04 VITALS — BP 136/83 | HR 59 | Temp 97.7°F | Ht 64.0 in | Wt 233.0 lb

## 2020-12-04 DIAGNOSIS — Z6837 Body mass index (BMI) 37.0-37.9, adult: Secondary | ICD-10-CM

## 2020-12-04 DIAGNOSIS — F3289 Other specified depressive episodes: Secondary | ICD-10-CM | POA: Diagnosis not present

## 2020-12-04 DIAGNOSIS — Z9189 Other specified personal risk factors, not elsewhere classified: Secondary | ICD-10-CM | POA: Diagnosis not present

## 2020-12-04 DIAGNOSIS — E8881 Metabolic syndrome: Secondary | ICD-10-CM | POA: Diagnosis not present

## 2020-12-04 MED ORDER — TIRZEPATIDE 5 MG/0.5ML ~~LOC~~ SOAJ: 5.0000 mg | SUBCUTANEOUS | 0 refills | Status: DC

## 2020-12-04 NOTE — Progress Notes (Signed)
Mounjaro

## 2020-12-04 NOTE — Progress Notes (Signed)
Chief Complaint:   OBESITY Theresa Mathews is here to discuss her progress with her obesity treatment plan along with follow-up of her obesity related diagnoses. Theresa Mathews is on the Category 2 Plan and states she is following her eating plan approximately 60% of the time. Theresa Mathews states she is walking for 30 minutes 3 times per week.  Today's visit was #: 50 Starting weight: 220 lbs Starting date: 08/26/2017 Today's weight: 233 lbs Today's date: 12/03/2020 Total lbs lost to date: 0 Total lbs lost since last in-office visit: 0  Interim History: Theresa Mathews notes increase snacking at night due to increased stress from her daughter's upcoming wedding. She is on plan during the day but struggles more at night. She is planning meals and cooks some of the time.  Subjective:   1. Insulin resistance Theresa Mathews has not noticed any change in appetite with Mounjaro. She denies nausea.  Lab Results  Component Value Date   INSULIN 7.4 06/26/2020   INSULIN 10.1 11/16/2019   INSULIN 11.0 06/14/2019   INSULIN 7.0 02/02/2019   INSULIN 9.5 03/14/2018   Lab Results  Component Value Date   HGBA1C 5.6 06/26/2020    2. Other depression, emotional eating Theresa Mathews notes quite a bit of stress due to upcoming wedding of daughter. She notes some anxiety and depression. She is on bupropion 200 mg twice daily.  3. At risk for side effect of medication Theresa Mathews is at risk for side effect of medication due to increase dose Mounjaro.  Assessment/Plan:   1. Insulin resistance Increase dose of Mounjaro to 5 mg weekly. She will discontinue Metformin.  - tirzepatide [facility]) 5 MG/0.5ML Pen; Inject 5 mg into the skin once a week.  Dispense: 6 mL; Refill: 0  2. Other depression, emotional eating  Theresa Mathews will continue bupropion. Orders and follow up as documented in patient record.    3. At risk for side effect of medication Theresa Mathews was given approximately 15 minutes of drug side effect counseling today.  We discussed side  effect possibility and risk versus benefits. Theresa Mathews agreed to the medication and will contact this office if these side effects are intolerable.  Repetitive spaced learning was employed today to elicit superior memory formation and behavioral change.   4. Obesity: BMI 39.97 Theresa Mathews is currently in the action stage of change. As such, her goal is to continue with weight loss efforts. She has agreed to the Category 2 Plan.   Exercise goals:  As is.  Behavioral modification strategies: decreasing simple carbohydrates and better snacking choices.  Theresa Mathews has agreed to follow-up with our clinic in 4 weeks.  Objective:   Blood pressure 136/83, pulse (!) 59, temperature 97.7 F (36.5 C), height '5\' 4"'$  (1.626 m), weight 233 lb (105.7 kg), SpO2 92 %. Body mass index is 39.99 kg/m.  General: Cooperative, alert, well developed, in no acute distress. HEENT: Conjunctivae and lids unremarkable. Cardiovascular: Regular rhythm.  Lungs: Normal work of breathing. Neurologic: No focal deficits.   Lab Results  Component Value Date   CREATININE 0.83 06/26/2020   BUN 15 06/26/2020   NA 141 06/26/2020   K 4.9 06/26/2020   CL 101 06/26/2020   CO2 25 06/26/2020   Lab Results  Component Value Date   ALT 16 06/26/2020   AST 13 06/26/2020   ALKPHOS 99 06/26/2020   BILITOT 0.4 06/26/2020   Lab Results  Component Value Date   HGBA1C 5.6 06/26/2020   HGBA1C 5.6 11/16/2019   HGBA1C 5.2 06/14/2019  HGBA1C 5.4 02/02/2019   HGBA1C 5.4 10/03/2018   Lab Results  Component Value Date   INSULIN 7.4 06/26/2020   INSULIN 10.1 11/16/2019   INSULIN 11.0 06/14/2019   INSULIN 7.0 02/02/2019   INSULIN 9.5 03/14/2018   Lab Results  Component Value Date   TSH 1.290 08/26/2017   Lab Results  Component Value Date   CHOL 233 (H) 06/26/2020   HDL 53 06/26/2020   LDLCALC 151 (H) 06/26/2020   TRIG 160 (H) 06/26/2020   CHOLHDL 3.1 06/14/2019   Lab Results  Component Value Date   VD25OH 34.8 06/26/2020    VD25OH 26.9 (L) 11/16/2019   VD25OH 34.2 06/14/2019   Lab Results  Component Value Date   WBC 8.5 07/12/2017   HGB 13.7 07/12/2017   HCT 39.8 07/12/2017   MCV 88.9 07/12/2017   PLT 383.0 07/12/2017   No results found for: IRON, TIBC, FERRITIN  Obesity Behavioral Intervention:   Approximately 15 minutes were spent on the discussion below.  ASK: We discussed the diagnosis of obesity with Theresa Mathews today and Theresa Mathews agreed to give [name] permission to discuss obesity behavioral modification therapy today.  ASSESS: Theresa Mathews has the diagnosis of obesity and her BMI today is 40.1. Theresa Mathews is in the action stage of change.   ADVISE: Theresa Mathews was educated on the multiple health risks of obesity as well as the benefit of weight loss to improve her health. She was advised of the need for long term treatment and the importance of lifestyle modifications to improve her current health and to decrease her risk of future health problems.  AGREE: Multiple dietary modification options and treatment options were discussed and Theresa Mathews agreed to follow the recommendations documented in the above note.  ARRANGE: Theresa Mathews was educated on the importance of frequent visits to treat obesity as outlined per CMS and USPSTF guidelines and agreed to schedule her next follow up appointment today.  Attestation Statements:   Reviewed by clinician on day of visit: allergies, medications, problem list, medical history, surgical history, family history, social history, and previous encounter notes.  I, Lizbeth Bark, RMA, am acting as Location manager for [employer], [place].   I have reviewed the above documentation for accuracy and completeness, and I agree with the above. -  Georgianne Fick, FNP

## 2020-12-09 ENCOUNTER — Encounter (INDEPENDENT_AMBULATORY_CARE_PROVIDER_SITE_OTHER): Payer: Self-pay

## 2021-01-01 ENCOUNTER — Encounter (INDEPENDENT_AMBULATORY_CARE_PROVIDER_SITE_OTHER): Payer: Self-pay | Admitting: Family Medicine

## 2021-01-01 ENCOUNTER — Other Ambulatory Visit: Payer: Self-pay

## 2021-01-01 ENCOUNTER — Ambulatory Visit (INDEPENDENT_AMBULATORY_CARE_PROVIDER_SITE_OTHER): Payer: No Typology Code available for payment source | Admitting: Family Medicine

## 2021-01-01 VITALS — BP 138/81 | HR 59 | Temp 97.9°F | Ht 64.0 in | Wt 229.0 lb

## 2021-01-01 DIAGNOSIS — F3289 Other specified depressive episodes: Secondary | ICD-10-CM

## 2021-01-01 DIAGNOSIS — E8881 Metabolic syndrome: Secondary | ICD-10-CM | POA: Diagnosis not present

## 2021-01-01 DIAGNOSIS — Z6837 Body mass index (BMI) 37.0-37.9, adult: Secondary | ICD-10-CM

## 2021-01-01 DIAGNOSIS — E88819 Insulin resistance, unspecified: Secondary | ICD-10-CM

## 2021-01-01 MED ORDER — TIRZEPATIDE 7.5 MG/0.5ML ~~LOC~~ SOAJ: 7.5000 mg | SUBCUTANEOUS | 0 refills | Status: DC

## 2021-01-01 NOTE — Progress Notes (Signed)
Chief Complaint:   OBESITY Theresa Mathews is here to discuss her progress with her obesity treatment plan along with follow-up of her obesity related diagnoses. Theresa Mathews is on the Category 2 Plan and states she is following her eating plan approximately 70% of the time. Theresa Mathews states she is doing 0 minutes 0 times per week.  Today's visit was #: 37 Starting weight: 220 lbs Starting date: 08/26/2017 Today's weight: 229 lbs Today's date: 01/01/2021 Total lbs lost to date: 0 Total lbs lost since last in-office visit: 4 lbs  Interim History: Hamdi notices that cravings and hunger are better controlled since starting the [facility]. She is preparing for her daughter's wedding next month November 5th and undergoing a great deal of stress. She has skipped breakfast a few times due to lack of hunger.  Subjective:   1. Insulin resistance Candia notes Theresa Mathews is helping with appetite and cravings. She notes slight constipation.   Lab Results  Component Value Date   INSULIN 7.4 06/26/2020   INSULIN 10.1 11/16/2019   INSULIN 11.0 06/14/2019   INSULIN 7.0 02/02/2019   INSULIN 9.5 03/14/2018   Lab Results  Component Value Date   HGBA1C 5.6 06/26/2020    2. Other depression, emotional eating Theresa Mathews's cravings are controlled. She notes stress and depression due to upcoming wedding.  Assessment/Plan:   1. Insulin resistance Theresa Mathews agrees to increase dose of Mounjaro from 5 mg to 7.5 mg.  - tirzepatide (MOUNJARO) 7.5 MG/0.5ML Pen; Inject 7.5 mg into the skin once a week.  Dispense: 6 mL; Refill: 0  2. Other depression, emotional eating   Theresa Mathews will continue bupropion and citalopram. Orders and follow up as documented in patient record.    3. Obesity: BMI 39.29 Theresa Mathews is currently in the action stage of change. As such, her goal is to continue with weight loss efforts. She has agreed to the Category 2 Plan.   Theresa Mathews will have a protein shake rather than skipping breakfast.  Exercise  goals: No exercise has been prescribed at this time.  Behavioral modification strategies: increasing water intake and no skipping meals.  Theresa Mathews has agreed to follow-up with our clinic in 4 weeks.  Objective:   Blood pressure 138/81, pulse (!) 59, temperature 97.9 F (36.6 C), height 5\' 4"  (1.626 m), weight 229 lb (103.9 kg), SpO2 95 %. Body mass index is 39.31 kg/m.  General: Cooperative, alert, well developed, in no acute distress. HEENT: Conjunctivae and lids unremarkable. Cardiovascular: Regular rhythm.  Lungs: Normal work of breathing. Neurologic: No focal deficits.   Lab Results  Component Value Date   CREATININE 0.83 06/26/2020   BUN 15 06/26/2020   NA 141 06/26/2020   K 4.9 06/26/2020   CL 101 06/26/2020   CO2 25 06/26/2020   Lab Results  Component Value Date   ALT 16 06/26/2020   AST 13 06/26/2020   ALKPHOS 99 06/26/2020   BILITOT 0.4 06/26/2020   Lab Results  Component Value Date   HGBA1C 5.6 06/26/2020   HGBA1C 5.6 11/16/2019   HGBA1C 5.2 06/14/2019   HGBA1C 5.4 02/02/2019   HGBA1C 5.4 10/03/2018   Lab Results  Component Value Date   INSULIN 7.4 06/26/2020   INSULIN 10.1 11/16/2019   INSULIN 11.0 06/14/2019   INSULIN 7.0 02/02/2019   INSULIN 9.5 03/14/2018   Lab Results  Component Value Date   TSH 1.290 08/26/2017   Lab Results  Component Value Date   CHOL 233 (H) 06/26/2020   HDL 53 06/26/2020  LDLCALC 151 (H) 06/26/2020   TRIG 160 (H) 06/26/2020   CHOLHDL 3.1 06/14/2019   Lab Results  Component Value Date   VD25OH 34.8 06/26/2020   VD25OH 26.9 (L) 11/16/2019   VD25OH 34.2 06/14/2019   Lab Results  Component Value Date   WBC 8.5 07/12/2017   HGB 13.7 07/12/2017   HCT 39.8 07/12/2017   MCV 88.9 07/12/2017   PLT 383.0 07/12/2017   No results found for: IRON, TIBC, FERRITIN  Attestation Statements:   Reviewed by clinician on day of visit: allergies, medications, problem list, medical history, surgical history, family history,  social history, and previous encounter notes.  I, Lizbeth Bark, RMA, am acting as Location manager for [employer], [place].   I have reviewed the above documentation for accuracy and completeness, and I agree with the above. -  Georgianne Fick, FNP

## 2021-01-09 ENCOUNTER — Other Ambulatory Visit (INDEPENDENT_AMBULATORY_CARE_PROVIDER_SITE_OTHER): Payer: Self-pay | Admitting: Family Medicine

## 2021-01-09 DIAGNOSIS — I10 Essential (primary) hypertension: Secondary | ICD-10-CM

## 2021-01-09 NOTE — Telephone Encounter (Signed)
LAST APPOINTMENT DATE: 01/01/21 NEXT APPOINTMENT DATE: 01/28/21   [facility] [address] [place], [place] - [address] [place] [address] Phone: [phone] Fax: [phone]  [id number] [place], [place] [address] Phone: [phone] Fax: [phone]  Patient is requesting a refill of the following medications: Requested Prescriptions   Pending Prescriptions Disp Refills   telmisartan (MICARDIS) 80 MG tablet [Pharmacy Med Name: TELMISARTAN 80 MG TABLET] 90 tablet 0    Sig: TAKE 1 TABLET BY MOUTH EVERY DAY    Date last filled: 10/01/20 Previously prescribed by Jake Bathe, NP  Lab Results  Component Value Date   HGBA1C 5.6 06/26/2020   HGBA1C 5.6 11/16/2019   HGBA1C 5.2 06/14/2019   Lab Results  Component Value Date   LDLCALC 151 (H) 06/26/2020   CREATININE 0.83 06/26/2020   Lab Results  Component Value Date   VD25OH 34.8 06/26/2020   VD25OH 26.9 (L) 11/16/2019   VD25OH 34.2 06/14/2019    BP Readings from Last 3 Encounters:  01/01/21 138/81  12/04/20 136/83  11/05/20 138/84

## 2021-01-27 ENCOUNTER — Ambulatory Visit (INDEPENDENT_AMBULATORY_CARE_PROVIDER_SITE_OTHER): Payer: No Typology Code available for payment source | Admitting: Neurology

## 2021-01-27 ENCOUNTER — Encounter: Payer: Self-pay | Admitting: Neurology

## 2021-01-27 VITALS — BP 135/80 | HR 79 | Ht 64.0 in | Wt 231.4 lb

## 2021-01-27 DIAGNOSIS — R519 Headache, unspecified: Secondary | ICD-10-CM

## 2021-01-27 DIAGNOSIS — R351 Nocturia: Secondary | ICD-10-CM

## 2021-01-27 DIAGNOSIS — E669 Obesity, unspecified: Secondary | ICD-10-CM | POA: Diagnosis not present

## 2021-01-27 DIAGNOSIS — R0683 Snoring: Secondary | ICD-10-CM

## 2021-01-27 DIAGNOSIS — G4719 Other hypersomnia: Secondary | ICD-10-CM

## 2021-01-27 NOTE — Patient Instructions (Signed)

## 2021-01-27 NOTE — Progress Notes (Signed)
Subjective:    Patient ID: Theresa Mathews is a 55 y.o. female.  HPI    Star Age, MD, PhD [facility] [address] [place], [place] [address]  Dear Dr. Carollee Herter,   I saw your patient, Theresa Mathews, upon your kind request in my sleep clinic today for initial consultation of her sleep disorder, in particular, concern for underlying obstructive sleep apnea.  The patient is unaccompanied today.  As you know, Ms. Menge is a 55 year old right-handed woman with an underlying medical history of reflux disease, lactose intolerance, insulin resistance, vitamin D deficiency, hypertension, hyperlipidemia and obesity, who reports snoring and excessive daytime somnolence.  She has woken up with a sense of gasping for air.  I reviewed your office note from 09/16/2020.  Her Epworth sleepiness score is 14 out of 24, fatigue severity score is 44 out of 63.  She has snored for years, her daytime somnolence has increased over time and her snoring has worsened especially with weight gain.  She has had trouble losing weight.  She follows with weight management.  She just recently started Mounjaro injections 8 weeks ago.  She has lost a little bit of weight thus far.  She has been on Wellbutrin but admits that she is not very good about taking day-to-day oral medications.  She goes to bed around 11 and rise time is between 630 and 7.  She has nocturia once per average night and has occasionally woken up with a headache in the morning, this is a dull, achy, mild frontal headache and sometimes she takes Excedrin.  She does not have a history of migraines.  Her mom snores and has a diagnosis of A. fib.  Patient reports feeling tired all the time and having lack of energy.  They have 1 dog in the household, she lives with her husband and youngest daughter.  She has 2 children altogether.  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  Her commute is now  only 10 minutes.  When she had a longer commute she does report feeling sleepy at the wheel and has dozed off in the past at the wheel, thankfully never had a car accident.  She drinks caffeine in the form of coffee or tea, 1 or 2 cups in the mornings, occasional soda, not daily, drinks alcohol about 3 days out of the week and is a non-smoker.  She has no TV in the bedroom.   Her Past Medical History Is Significant For: Past Medical History:  Diagnosis Date   Anxiety    Back pain    Depression    Fatigue    GERD (gastroesophageal reflux disease)    Lactose intolerance     Her Past Surgical History Is Significant For: Past Surgical History:  Procedure Laterality Date   BLADDER SUSPENSION N/A 01/23/2015   Procedure: TRANSVAGINAL TAPE (TVT) PROCEDURE;  Surgeon: Bobbye Charleston, MD;  Location: [facility];  Service: Gynecology;  Laterality: N/A;   CYSTOSCOPY N/A 01/23/2015   Procedure: CYSTOSCOPY;  Surgeon: Bobbye Charleston, MD;  Location: [facility];  Service: Gynecology;  Laterality: N/A;   DILATION AND CURETTAGE OF UTERUS  2005   ROBOTIC ASSISTED TOTAL HYSTERECTOMY WITH BILATERAL SALPINGO OOPHERECTOMY Bilateral 01/23/2015   Procedure: ROBOTIC ASSISTED TOTAL HYSTERECTOMY WITH BILATERAL SALPINGO OOPHORECTOMY;  Surgeon: Bobbye Charleston, MD;  Location: [facility];  Service: Gynecology;  Laterality: Bilateral;    Her Family History Is Significant For: Family History  Problem Relation  Age of Onset   Hypertension Mother    Ovarian cancer Mother    Heart disease Mother 2       a fib   Obesity Mother    Hypertension Father    Diabetes Father    Hyperlipidemia Father    Obesity Father     Her Social History Is Significant For: Social History   Socioeconomic History   Marital status: Married    Spouse name: Willie Loy   Number of children: 2   Years of education: Not on file   Highest education level: Not on file  Occupational History   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    Comment: [employer]  Tobacco Use   Smoking status: Former    Packs/day: 0.50    Years: 4.00    Pack years: 2.00    Types: Cigarettes    Quit date: 1994    Years since quitting: 28.8   Smokeless tobacco: Never   Tobacco comments:    smoked in college  Vaping Use   Vaping Use: Never used  Substance and Sexual Activity   Alcohol use: Yes    Alcohol/week: 5.0 standard drinks    Types: 5 Glasses of wine per week   Drug use: No   Sexual activity: Yes    Partners: Male  Other Topics Concern   Not on file  Social History Narrative   Exercise- walks 4 times a week   Caffeine: 1-2 cups daily   Education: BS textile [place]   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    Social Determinants of Health   Financial Resource Strain: Not on file  Food Insecurity: Not on file  Transportation Needs: Not on file  Physical Activity: Not on file  Stress: Not on file  Social Connections: Not on file    Her Allergies Are:  No Known Allergies:   Her Current Medications Are:  Outpatient Encounter Medications as of 01/27/2021  Medication Sig   buPROPion (WELLBUTRIN SR) 200 MG 12 hr tablet Take 1 tablet (200 mg total) by mouth 2 (two) times daily.   Cholecalciferol (VITAMIN D3) 1.25 MG (50000 UT) CAPS Take 1 capsule by mouth every 3 (three) days.   citalopram (CELEXA) 20 MG tablet Take 1 tablet (20 mg total) by mouth daily.   ranitidine (ZANTAC) 75 MG tablet Take 75 mg by mouth daily at 6 (six) AM.    telmisartan (MICARDIS) 80 MG tablet TAKE 1 TABLET BY MOUTH EVERY DAY   tirzepatide (MOUNJARO) 7.5 MG/0.5ML Pen Inject 7.5 mg into the skin once a week.   Facility-Administered Encounter Medications as of 01/27/2021  Medication   0.9 %  sodium chloride infusion  :   Review of Systems:  Out of a complete 14 point review of systems, all are reviewed and negative with the exception of these symptoms as listed below:   Review of Systems  Neurological:        Snoring, insomnia, wakes herself up with  gasp, cannot lose weight.  Progressively worsening.  ESS 14, FSS :44.     Objective:  Neurological Exam  Physical Exam Physical Examination:   Vitals:   01/27/21 0852  BP: 135/80  Pulse: 79    General Examination: The patient is a very pleasant 55 y.o. female in no acute distress. She appears well-developed and well-nourished and well groomed.   HEENT: Normocephalic, atraumatic, pupils are equal, round and reactive to light, extraocular tracking is good without limitation to gaze excursion or nystagmus  noted. Hearing is grossly intact. Face is symmetric with normal facial animation. Speech is clear with no dysarthria noted. There is no hypophonia. There is no lip, neck/head, jaw or voice tremor. Neck is supple with full range of passive and active motion. There are no carotid bruits on auscultation. Oropharynx exam reveals: mild mouth dryness, good dental hygiene and moderate airway crowding, due to a small airway entry, she also has a small mouth opening.  Tonsils on the smaller side and tip of uvula not fully visualized. Mallampati is class III. Tongue protrudes centrally.  Neck circumference of 15 1 8  inches.   Chest: Clear to auscultation without wheezing, rhonchi or crackles noted.  Heart: S1+S2+0, regular and normal without murmurs, rubs or gallops noted.   Abdomen: Soft, non-tender and non-distended with normal bowel sounds appreciated on auscultation.  Extremities: There is no pitting edema in the distal lower extremities bilaterally.   Skin: Warm and dry without trophic changes noted.   Musculoskeletal: exam reveals no obvious joint deformities, tenderness or joint swelling or erythema.   Neurologically:  Mental status: The patient is awake, alert and oriented in all 4 spheres. Her immediate and remote memory, attention, language skills and fund of knowledge are appropriate. There is no evidence of aphasia, agnosia, apraxia or anomia. Speech is clear with normal prosody and  enunciation. Thought process is linear. Mood is normal and affect is normal.  Cranial nerves II - XII are as described above under HEENT exam.  Motor exam: Normal bulk, strength and tone is noted. There is no tremor, fine motor skills and coordination: grossly intact.  Cerebellar testing: No dysmetria or intention tremor. There is no truncal or gait ataxia.  Sensory exam: intact to light touch in the upper and lower extremities.  Gait, station and balance: She stands easily. No veering to one side is noted. No leaning to one side is noted. Posture is age-appropriate and stance is narrow based. Gait shows normal stride length and normal pace. No problems turning are noted.  Assessment and Plan:  In summary, NAVEA WOODROW is a very pleasant 56 y.o.-year old female with an underlying medical history of reflux disease, lactose intolerance, insulin resistance, vitamin D deficiency, hypertension, hyperlipidemia and obesity, whose history and physical exam are concerning for obstructive sleep apnea (OSA). I had a long chat with the patient about my findings and the diagnosis of OSA, its prognosis and treatment options. We talked about medical treatments, surgical interventions and non-pharmacological approaches. I explained in particular the risks and ramifications of untreated moderate to severe OSA, especially with respect to developing cardiovascular disease down the Road, including congestive heart failure, difficult to treat hypertension, cardiac arrhythmias, or stroke. Even type 2 diabetes has, in part, been linked to untreated OSA. Symptoms of untreated OSA include daytime sleepiness, memory problems, mood irritability and mood disorder such as depression and anxiety, lack of energy, as well as recurrent headaches, especially morning headaches. We talked about trying to maintain a healthy lifestyle in general, as well as the importance of weight control. We also talked about the importance of good sleep  hygiene. I recommended the following at this time: sleep study.  I outlined the difference between a laboratory attended sleep study versus home sleep test. I explained the sleep test procedure to the patient and also outlined possible surgical and non-surgical treatment options of OSA, including the use of a custom-made dental device (which would require a referral to a specialist dentist or oral surgeon), upper airway  surgical options, such as traditional UPPP or a novel less invasive surgical option in the form of Inspire hypoglossal nerve stimulation (which would involve a referral to an ENT surgeon). I also explained the CPAP treatment option to the patient, who indicated that she would be willing to try CPAP if the need arises. I explained the importance of being compliant with PAP treatment, not only for insurance purposes but primarily to improve Her symptoms, and for the patient's long term health benefit, including to reduce Her cardiovascular risks. I answered all her questions today and the patient was in agreement. I plan to see her back after the sleep study is completed and encouraged her to call with any interim questions, concerns, problems or updates.   Thank you very much for allowing me to participate in the care of this nice patient. If I can be of any further assistance to you please do not hesitate to call me at [phone].  Sincerely,   Star Age, MD, PhD

## 2021-01-28 ENCOUNTER — Encounter (INDEPENDENT_AMBULATORY_CARE_PROVIDER_SITE_OTHER): Payer: Self-pay | Admitting: Family Medicine

## 2021-01-28 ENCOUNTER — Other Ambulatory Visit: Payer: Self-pay

## 2021-01-28 ENCOUNTER — Ambulatory Visit (INDEPENDENT_AMBULATORY_CARE_PROVIDER_SITE_OTHER): Payer: No Typology Code available for payment source | Admitting: Family Medicine

## 2021-01-28 VITALS — BP 120/78 | HR 66 | Temp 97.9°F | Ht 64.0 in | Wt 227.0 lb

## 2021-01-28 DIAGNOSIS — E8881 Metabolic syndrome: Secondary | ICD-10-CM

## 2021-01-28 DIAGNOSIS — Z6837 Body mass index (BMI) 37.0-37.9, adult: Secondary | ICD-10-CM

## 2021-01-28 DIAGNOSIS — E559 Vitamin D deficiency, unspecified: Secondary | ICD-10-CM

## 2021-01-28 DIAGNOSIS — E7849 Other hyperlipidemia: Secondary | ICD-10-CM | POA: Diagnosis not present

## 2021-01-28 DIAGNOSIS — E88819 Insulin resistance, unspecified: Secondary | ICD-10-CM

## 2021-01-28 MED ORDER — VITAMIN D3 1.25 MG (50000 UT) PO CAPS: 1.0000 | ORAL_CAPSULE | ORAL | 0 refills | Status: DC

## 2021-01-28 NOTE — Progress Notes (Signed)
Chief Complaint:   OBESITY Theresa Mathews is here to discuss her progress with her obesity treatment plan along with follow-up of her obesity related diagnoses. Theresa Mathews is on the Category 2 Plan and states she is following her eating plan approximately 70% of the time. Theresa Mathews states she is doing 0 0 minutes 0 times per week.  Today's visit was #: 67 Starting weight: 220 lbs Starting date: 08/26/2017 Today's weight: 227 lbs Today's date: 01/27/2021 Total lbs lost to date: 0 Total lbs lost since last in-office visit: 2 lbs  Interim History: Theresa Mathews's daughter is getting married this Saturday and Theresa Mathews is quite stressed. However, she notes she is not overeating where as in the past she would have. She notes decreased protein intake. Her snacking has decreased.  She has not had time to shop or meal plan.  Subjective:   1. Insulin resistance Theresa Mathews is on Mounjaro 7.5 mg and she has had one dose so far. She notes harder stools. She manages this with Metamucil. Her snacking is well controlled.  Lab Results  Component Value Date   INSULIN 7.4 06/26/2020   INSULIN 10.1 11/16/2019   INSULIN 11.0 06/14/2019   INSULIN 7.0 02/02/2019   INSULIN 9.5 03/14/2018   Lab Results  Component Value Date   HGBA1C 5.6 06/26/2020    2. Vitamin D deficiency Theresa Mathews's Vitamin D is low at 34.8. She is on prescription Vitamin D twice weekly.   Lab Results  Component Value Date   VD25OH 34.8 06/26/2020   VD25OH 26.9 (L) 11/16/2019   VD25OH 34.2 06/14/2019    3. Other hyperlipidemia Theresa Mathews's last LDL was elevated at 151. Her Triglycerides was also elevated at 160. Her HDL was at goal of 53. She is currently not on statin.   Lab Results  Component Value Date   CHOL 233 (H) 06/26/2020   HDL 53 06/26/2020   LDLCALC 151 (H) 06/26/2020   TRIG 160 (H) 06/26/2020   CHOLHDL 3.1 06/14/2019   Lab Results  Component Value Date   ALT 16 06/26/2020   AST 13 06/26/2020   ALKPHOS 99 06/26/2020   BILITOT 0.4  06/26/2020   The 10-year ASCVD risk score (Arnett DK, et al., 2019) is: 2.6%   Values used to calculate the score:     Age: 55 years     Sex: Female     Is Non-Hispanic African American: No     Diabetic: No     Tobacco smoker: No     Systolic Blood Pressure: 321 mmHg     Is BP treated: Yes     HDL Cholesterol: 53 mg/dL     Total Cholesterol: 233 mg/dL   Assessment/Plan:   1. Insulin resistance Theresa Mathews will continue Mounjaro 7.5 mg. She will message me about whether to increase dose of Mounjaro since she will probably need a refill before her next OV. We will check labs today.  - Comprehensive metabolic panel - Hemoglobin A1c - Insulin, random  2. Vitamin D deficiency We will refill prescription Vitamin D 50,000 IU every 3rd day and Theresa Mathews will follow-up for routine testing of Vitamin D, at least 2-3 times per year to avoid over-replacement. We will check labs today.   - VITAMIN D 25 Hydroxy (Vit-D Deficiency, Fractures) - Cholecalciferol (VITAMIN D3) 1.25 MG (50000 UT) CAPS; Take 1 capsule by mouth every 3 (three) days.  Dispense: 10 capsule; Refill: 0  3. Other hyperlipidemia  We will check FLP today.   - Lipid Panel  With LDL/HDL Ratio  4. Obesity: BMI 38.95 Theresa Mathews is currently in the action stage of change. As such, her goal is to continue with weight loss efforts. She has agreed to the Category 2 Plan.   Exercise goals: No exercise has been prescribed at this time.  Behavioral modification strategies: increasing lean protein intake and meal planning and cooking strategies.  Theresa Mathews has agreed to follow-up with our clinic in 4 weeks.  Objective:   Blood pressure 120/78, pulse 66, temperature 97.9 F (36.6 C), height 5\' 4"  (1.626 m), weight 227 lb (103 kg), SpO2 99 %. Body mass index is 38.96 kg/m.  General: Cooperative, alert, well developed, in no acute distress. HEENT: Conjunctivae and lids unremarkable. Cardiovascular: Regular rhythm.  Lungs: Normal work of  breathing. Neurologic: No focal deficits.   Lab Results  Component Value Date   CREATININE 0.83 06/26/2020   BUN 15 06/26/2020   NA 141 06/26/2020   K 4.9 06/26/2020   CL 101 06/26/2020   CO2 25 06/26/2020   Lab Results  Component Value Date   ALT 16 06/26/2020   AST 13 06/26/2020   ALKPHOS 99 06/26/2020   BILITOT 0.4 06/26/2020   Lab Results  Component Value Date   HGBA1C 5.6 06/26/2020   HGBA1C 5.6 11/16/2019   HGBA1C 5.2 06/14/2019   HGBA1C 5.4 02/02/2019   HGBA1C 5.4 10/03/2018   Lab Results  Component Value Date   INSULIN 7.4 06/26/2020   INSULIN 10.1 11/16/2019   INSULIN 11.0 06/14/2019   INSULIN 7.0 02/02/2019   INSULIN 9.5 03/14/2018   Lab Results  Component Value Date   TSH 1.290 08/26/2017   Lab Results  Component Value Date   CHOL 233 (H) 06/26/2020   HDL 53 06/26/2020   LDLCALC 151 (H) 06/26/2020   TRIG 160 (H) 06/26/2020   CHOLHDL 3.1 06/14/2019   Lab Results  Component Value Date   VD25OH 34.8 06/26/2020   VD25OH 26.9 (L) 11/16/2019   VD25OH 34.2 06/14/2019   Lab Results  Component Value Date   WBC 8.5 07/12/2017   HGB 13.7 07/12/2017   HCT 39.8 07/12/2017   MCV 88.9 07/12/2017   PLT 383.0 07/12/2017   No results found for: IRON, TIBC, FERRITIN  Attestation Statements:   Reviewed by clinician on day of visit: allergies, medications, problem list, medical history, surgical history, family history, social history, and previous encounter notes.  I, Lizbeth Bark, RMA, am acting as Location manager for [employer], [place].   I have reviewed the above documentation for accuracy and completeness, and I agree with the above. -  Georgianne Fick, FNP

## 2021-01-29 LAB — COMPREHENSIVE METABOLIC PANEL
ALT: 12 IU/L (ref 0–32)
AST: 17 IU/L (ref 0–40)
Albumin/Globulin Ratio: 1.9 (ref 1.2–2.2)
Albumin: 4.5 g/dL (ref 3.8–4.9)
Alkaline Phosphatase: 103 IU/L (ref 44–121)
BUN/Creatinine Ratio: 20 (ref 9–23)
BUN: 19 mg/dL (ref 6–24)
Bilirubin Total: 0.5 mg/dL (ref 0.0–1.2)
CO2: 25 mmol/L (ref 20–29)
Calcium: 9.2 mg/dL (ref 8.7–10.2)
Chloride: 103 mmol/L (ref 96–106)
Creatinine, Ser: 0.95 mg/dL (ref 0.57–1.00)
Globulin, Total: 2.4 g/dL (ref 1.5–4.5)
Glucose: 90 mg/dL (ref 70–99)
Potassium: 4.8 mmol/L (ref 3.5–5.2)
Sodium: 142 mmol/L (ref 134–144)
Total Protein: 6.9 g/dL (ref 6.0–8.5)
eGFR: 71 mL/min/{1.73_m2} (ref >59)

## 2021-01-29 LAB — VITAMIN D 25 HYDROXY (VIT D DEFICIENCY, FRACTURES): Vit D, 25-Hydroxy: 29.1 ng/mL — ABNORMAL LOW (ref 30.0–100.0)

## 2021-01-29 LAB — INSULIN, RANDOM: INSULIN: 7.7 u[IU]/mL (ref 2.6–24.9)

## 2021-01-29 LAB — LIPID PANEL WITH LDL/HDL RATIO
Cholesterol, Total: 217 mg/dL — ABNORMAL HIGH (ref 100–199)
HDL: 57 mg/dL (ref >39)
LDL Chol Calc (NIH): 140 mg/dL — ABNORMAL HIGH (ref 0–99)
LDL/HDL Ratio: 2.5 ratio (ref 0.0–3.2)
Triglycerides: 111 mg/dL (ref 0–149)
VLDL Cholesterol Cal: 20 mg/dL (ref 5–40)

## 2021-01-29 LAB — HEMOGLOBIN A1C
Est. average glucose Bld gHb Est-mCnc: 100 mg/dL
Hgb A1c MFr Bld: 5.1 % (ref 4.8–5.6)

## 2021-02-10 ENCOUNTER — Encounter (INDEPENDENT_AMBULATORY_CARE_PROVIDER_SITE_OTHER): Payer: Self-pay | Admitting: Family Medicine

## 2021-02-10 DIAGNOSIS — E8881 Metabolic syndrome: Secondary | ICD-10-CM

## 2021-02-10 DIAGNOSIS — E88819 Insulin resistance, unspecified: Secondary | ICD-10-CM

## 2021-02-10 MED ORDER — TIRZEPATIDE 10 MG/0.5ML ~~LOC~~ SOAJ: 10.0000 mg | SUBCUTANEOUS | 0 refills | Status: DC

## 2021-02-10 NOTE — Telephone Encounter (Signed)
Please advise, would you rather have patient wait until her visit to increase dosage?  LAST APPOINTMENT DATE: 01/28/21 NEXT APPOINTMENT DATE: 02/25/21   [facility] [address] [place], [place] - [address] [place] [address] Phone: [phone] Fax: [phone]  [id number] [place], [place] [address] Phone: [phone] Fax: [phone]  Patient is requesting a refill of the following medications: Requested Prescriptions    No prescriptions requested or ordered in this encounter    Date last filled: 01/01/21 Previously prescribed by Jake Bathe, NP  Lab Results  Component Value Date   HGBA1C 5.1 01/28/2021   HGBA1C 5.6 06/26/2020   HGBA1C 5.6 11/16/2019   Lab Results  Component Value Date   LDLCALC 140 (H) 01/28/2021   CREATININE 0.95 01/28/2021   Lab Results  Component Value Date   VD25OH 29.1 (L) 01/28/2021   VD25OH 34.8 06/26/2020   VD25OH 26.9 (L) 11/16/2019    BP Readings from Last 3 Encounters:  01/28/21 120/78  01/27/21 135/80  01/01/21 138/81

## 2021-02-17 ENCOUNTER — Ambulatory Visit (INDEPENDENT_AMBULATORY_CARE_PROVIDER_SITE_OTHER): Payer: No Typology Code available for payment source | Admitting: Neurology

## 2021-02-17 DIAGNOSIS — G4719 Other hypersomnia: Secondary | ICD-10-CM

## 2021-02-17 DIAGNOSIS — R0683 Snoring: Secondary | ICD-10-CM

## 2021-02-17 DIAGNOSIS — G4733 Obstructive sleep apnea (adult) (pediatric): Secondary | ICD-10-CM | POA: Diagnosis not present

## 2021-02-17 DIAGNOSIS — R519 Headache, unspecified: Secondary | ICD-10-CM

## 2021-02-17 DIAGNOSIS — E669 Obesity, unspecified: Secondary | ICD-10-CM

## 2021-02-17 DIAGNOSIS — R351 Nocturia: Secondary | ICD-10-CM

## 2021-02-17 DIAGNOSIS — G4734 Idiopathic sleep related nonobstructive alveolar hypoventilation: Secondary | ICD-10-CM

## 2021-02-19 NOTE — Progress Notes (Signed)
[facility]  HOME SLEEP TEST (Watch PAT) REPORT  STUDY DATE: 02/17/2021  DOB: 16-Nov-1965  MRN: [id number]  ORDERING CLINICIAN: Star Age, MD, PhD   REFERRING CLINICIAN: Ann Held, DO   CLINICAL INFORMATION/HISTORY: 55 year old right-handed woman with an underlying medical history of reflux disease, lactose intolerance, insulin resistance, vitamin D deficiency, hypertension, hyperlipidemia and obesity, who reports snoring and excessive daytime somnolence.  She has woken up with a sense of gasping for air.   Epworth sleepiness score: 14/24.  BMI: 39.5 kg/m  FINDINGS:   Sleep Summary:   Total Recording Time (hours, min): 8 hours, 28 minutes  Total Sleep Time (hours, min):  7 hours, 54 minutes   Percent REM (%):    22.4%   Respiratory Indices:   Calculated pAHI (per hour):  69.2/hour         REM pAHI:    58.1/hour       NREM pAHI: 71.9/hour  Oxygen Saturation Statistics:    Oxygen Saturation (%) Mean: 93%   Minimum oxygen saturation (%):                 72%   O2 Saturation Range (%): 72-99%    O2 Saturation (minutes) <=88%: 52.5 min  Pulse Rate Statistics:   Pulse Mean (bpm):   74/min    Pulse Range (52-145/min)   IMPRESSION: OSA (obstructive sleep apnea), severe Nocturnal hypoxemia  RECOMMENDATION:  This home sleep test demonstrates severe obstructive sleep apnea with a total AHI of 69.2/hour and O2 nadir of 72%.  There was significant time below or at 88% saturation of nearly 1 hour from the night, in keeping with nocturnal hypoxemia.  Snoring was in the moderate and at times louder range.  Treatment with positive airway pressure is highly recommended. This will require - ideally - a full night CPAP titration study for proper treatment settings, O2 monitoring and mask fitting. For now, the patient will be advised to proceed with an autoPAP titration/trial at home.  A laboratory attended titration study can be considered in  the future for optimization of his treatment and better tolerance of therapy.  Alternative treatment options are limited secondary to the severity of the patient's sleep disordered breathing.  Concomitant weight loss is highly recommended.  Please note, that untreated obstructive sleep apnea may carry additional perioperative morbidity. Patients with significant obstructive sleep apnea should receive perioperative PAP therapy and the surgeons and particularly the anesthesiologist should be informed of the diagnosis and the severity of the sleep disordered breathing. The patient should be cautioned not to drive, work at heights, or operate dangerous or heavy equipment when tired or sleepy. Review and reiteration of good sleep hygiene measures should be pursued with any patient. Other causes of the patient's symptoms, including circadian rhythm disturbances, an underlying mood disorder, medication effect and/or an underlying medical problem cannot be ruled out based on this test. Clinical correlation is recommended. The patient and her referring provider will be notified of the test results. The patient will be seen in follow up in sleep clinic at [facility].  I certify that I have reviewed the raw data recording prior to the issuance of this report in accordance with the standards of the American Academy of Sleep Medicine (AASM).    INTERPRETING PHYSICIAN:   Star Age, MD, PhD  Board Certified in Neurology and Sleep Medicine  [facility] [address] [place], [place] [address] [phone]

## 2021-02-24 NOTE — Addendum Note (Signed)
Addended by: Star Age on: 02/24/2021 04:58 PM   Modules accepted: Orders

## 2021-02-24 NOTE — Procedures (Signed)
[facility]  HOME SLEEP TEST (Watch PAT) REPORT  STUDY DATE: 02/17/2021  DOB: May 23, 1965  MRN: [id number]  ORDERING CLINICIAN: Star Age, MD, PhD   REFERRING CLINICIAN: Ann Held, DO   CLINICAL INFORMATION/HISTORY: 55 year old right-handed woman with an underlying medical history of reflux disease, lactose intolerance, insulin resistance, vitamin D deficiency, hypertension, hyperlipidemia and obesity, who reports snoring and excessive daytime somnolence.  She has woken up with a sense of gasping for air.   Epworth sleepiness score: 14/24.  BMI: 39.5 kg/m  FINDINGS:   Sleep Summary:   Total Recording Time (hours, min): 8 hours, 28 minutes  Total Sleep Time (hours, min):  7 hours, 54 minutes   Percent REM (%):    22.4%   Respiratory Indices:   Calculated pAHI (per hour):  69.2/hour         REM pAHI:    58.1/hour       NREM pAHI: 71.9/hour  Oxygen Saturation Statistics:    Oxygen Saturation (%) Mean: 93%   Minimum oxygen saturation (%):                 72%   O2 Saturation Range (%): 72-99%    O2 Saturation (minutes) <=88%: 52.5 min  Pulse Rate Statistics:   Pulse Mean (bpm):   74/min    Pulse Range (52-145/min)   IMPRESSION: OSA (obstructive sleep apnea), severe Nocturnal hypoxemia  RECOMMENDATION:  This home sleep test demonstrates severe obstructive sleep apnea with a total AHI of 69.2/hour and O2 nadir of 72%.  There was significant time below or at 88% saturation of nearly 1 hour from the night, in keeping with nocturnal hypoxemia.  Snoring was in the moderate and at times louder range.  Treatment with positive airway pressure is highly recommended. This will require - ideally - a full night CPAP titration study for proper treatment settings, O2 monitoring and mask fitting. For now, the patient will be advised to proceed with an autoPAP titration/trial at home.  A laboratory attended titration study can be considered in  the future for optimization of his treatment and better tolerance of therapy.  Alternative treatment options are limited secondary to the severity of the patient's sleep disordered breathing.  Concomitant weight loss is highly recommended.  Please note, that untreated obstructive sleep apnea may carry additional perioperative morbidity. Patients with significant obstructive sleep apnea should receive perioperative PAP therapy and the surgeons and particularly the anesthesiologist should be informed of the diagnosis and the severity of the sleep disordered breathing. The patient should be cautioned not to drive, work at heights, or operate dangerous or heavy equipment when tired or sleepy. Review and reiteration of good sleep hygiene measures should be pursued with any patient. Other causes of the patient's symptoms, including circadian rhythm disturbances, an underlying mood disorder, medication effect and/or an underlying medical problem cannot be ruled out based on this test. Clinical correlation is recommended. The patient and her referring provider will be notified of the test results. The patient will be seen in follow up in sleep clinic at [facility].  I certify that I have reviewed the raw data recording prior to the issuance of this report in accordance with the standards of the American Academy of Sleep Medicine (AASM).    INTERPRETING PHYSICIAN:   Star Age, MD, PhD  Board Certified in Neurology and Sleep Medicine  [facility] [address] [place], [place] [address] [phone]

## 2021-02-25 ENCOUNTER — Encounter (INDEPENDENT_AMBULATORY_CARE_PROVIDER_SITE_OTHER): Payer: Self-pay | Admitting: Family Medicine

## 2021-02-25 ENCOUNTER — Encounter: Payer: Self-pay | Admitting: *Deleted

## 2021-02-25 ENCOUNTER — Other Ambulatory Visit: Payer: Self-pay

## 2021-02-25 ENCOUNTER — Ambulatory Visit (INDEPENDENT_AMBULATORY_CARE_PROVIDER_SITE_OTHER): Payer: No Typology Code available for payment source | Admitting: Family Medicine

## 2021-02-25 ENCOUNTER — Telehealth: Payer: Self-pay | Admitting: *Deleted

## 2021-02-25 VITALS — BP 132/85 | HR 63 | Temp 98.0°F | Ht 64.0 in | Wt 226.0 lb

## 2021-02-25 DIAGNOSIS — Z6837 Body mass index (BMI) 37.0-37.9, adult: Secondary | ICD-10-CM | POA: Diagnosis not present

## 2021-02-25 DIAGNOSIS — E8881 Metabolic syndrome: Secondary | ICD-10-CM

## 2021-02-25 DIAGNOSIS — F3289 Other specified depressive episodes: Secondary | ICD-10-CM | POA: Diagnosis not present

## 2021-02-25 MED ORDER — TIRZEPATIDE 12.5 MG/0.5ML ~~LOC~~ SOAJ: 12.5000 mg | SUBCUTANEOUS | 0 refills | Status: DC

## 2021-02-25 NOTE — Telephone Encounter (Signed)
Called pt & LVM (ok per DPR) asking for call back at her earliest convenience to discuss sleep study results as Dr Rexene Alberts would like to start patient on autoPAP asap. Her sleep test showed severe OSA. Left office number in message.

## 2021-02-25 NOTE — Telephone Encounter (Addendum)
The patient returned my call and we discussed the results of the sleep study. Pt aware her HST showed severe OSA and recommendation from Dr Rexene Alberts is to treat with autoPAP asap. The pt is amenable to this and we discussed her questions. She understands the difference between CPAP and autoPAP. She also understands the insurance compliance requirements (at least 4 hours usage per night + OV 30-90 days after setup). I went ahead and scheduled the pt for an initial f/u on 05/15/21 at 11:30 AM. Pt verbalized appreciation for the call. She will r/s her appt if it does not fall within required time frame.   Urgent order sent to [facility]. Letter sent to pt's mychart. Result sent to PCP. [facility] confirmed receipt of order.

## 2021-02-25 NOTE — Telephone Encounter (Signed)
-----   Message from Star Age, MD sent at 02/24/2021  4:58 PM EST ----- Patient referred by her primary care physician, seen by me on 01/27/2021, patient had a HST on 02/17/2021.    Please call and notify the patient that the recent home sleep test showed obstructive sleep apnea in the severe range. I strongly recommend treatment for this in the form of autoPAP, which means, that we don't have to bring her in for a sleep study with CPAP, but will let her start using a so called autoPAP machine at home, through a DME company (of her choice, or as per insurance requirement). The DME representative will fit the patient with a mask of choice, educate her on how to use the machine, how to put the mask on, etc. I have placed an order in the chart -please mark this as an urgent referral to the DME.   Please send the order to a local DME, talk to patient, send report to referring MD. Please also reinforce the need for compliance with treatment. We will need a FU in sleep clinic for 10 weeks post-PAP set up, please arrange that with me or one of our NPs. Thanks,   Star Age, MD, PhD [facility])

## 2021-02-25 NOTE — Progress Notes (Signed)
Chief Complaint:   OBESITY Theresa Mathews is here to discuss her progress with her obesity treatment plan along with follow-up of her obesity related diagnoses. Theresa Mathews is on the Category 2 Plan and states she is following her eating plan approximately 70% of the time. Theresa Mathews states she is doing 0 minutes 0 times per week.  Today's visit was #: 40 Starting weight: 220 lbs Starting date: 08/26/2017 Today's weight: 226 lbs Today's date: 02/25/2021 Total lbs lost to date: 0 Total lbs lost since last in-office visit: 1 lb  Interim History: Theresa Mathews is disappointed with only 1 lb weight loss. However, her daughter got married recently and Thanksgiving was < 1 week ago so I reassured her that she did well.  She is working on meal planning more.   Subjective:   1. Insulin resistance Theresa Mathews is tolerating the [facility] well. Her constipation has improved.  Lab Results  Component Value Date   INSULIN 7.7 01/28/2021   INSULIN 7.4 06/26/2020   INSULIN 10.1 11/16/2019   INSULIN 11.0 06/14/2019   INSULIN 7.0 02/02/2019   Lab Results  Component Value Date   HGBA1C 5.1 01/28/2021     2. Other depression, emotional eating Theresa Mathews feels she sometimes eats through the effects of the [facility]. She feels she need to clean her plate. Her mood is stable on bupropion.   Assessment/Plan:   1. Insulin resistance Theresa Mathews agrees to increase dose of Mounjaro. We will refill Mounjaro 12.5 mg weekly.   - tirzepatide (MOUNJARO) 12.5 MG/0.5ML Pen; Inject 12.5 mg into the skin once a week.  Dispense: 6 mL; Refill: 0  2. Other depression, emotional eating  Theresa Mathews will continue bupropion and Celexa. We discussed strategies for reducing portion size, smaller plate, food not touching on plate, smaller utensils.   4. Obesity: BMI 38.77 Theresa Mathews is currently in the action stage of change. As such, her goal is to continue with weight loss efforts. She has agreed to the Category 2 Plan.   Theresa Mathews will work on being  aware of cues of fullness.  Exercise goals: No exercise has been prescribed at this time.  Behavioral modification strategies: meal planning and cooking strategies.  Theresa Mathews has agreed to follow-up with our clinic in 3-5 weeks.  Objective:   Blood pressure 132/85, pulse 63, temperature 98 F (36.7 C), height 5\' 4"  (1.626 m), weight 226 lb (102.5 kg), SpO2 99 %. Body mass index is 38.79 kg/m.  General: Cooperative, alert, well developed, in no acute distress. HEENT: Conjunctivae and lids unremarkable. Cardiovascular: Regular rhythm.  Lungs: Normal work of breathing. Neurologic: No focal deficits.   Lab Results  Component Value Date   CREATININE 0.95 01/28/2021   BUN 19 01/28/2021   NA 142 01/28/2021   K 4.8 01/28/2021   CL 103 01/28/2021   CO2 25 01/28/2021   Lab Results  Component Value Date   ALT 12 01/28/2021   AST 17 01/28/2021   ALKPHOS 103 01/28/2021   BILITOT 0.5 01/28/2021   Lab Results  Component Value Date   HGBA1C 5.1 01/28/2021   HGBA1C 5.6 06/26/2020   HGBA1C 5.6 11/16/2019   HGBA1C 5.2 06/14/2019   HGBA1C 5.4 02/02/2019   Lab Results  Component Value Date   INSULIN 7.7 01/28/2021   INSULIN 7.4 06/26/2020   INSULIN 10.1 11/16/2019   INSULIN 11.0 06/14/2019   INSULIN 7.0 02/02/2019   Lab Results  Component Value Date   TSH 1.290 08/26/2017   Lab Results  Component Value Date  CHOL 217 (H) 01/28/2021   HDL 57 01/28/2021   LDLCALC 140 (H) 01/28/2021   TRIG 111 01/28/2021   CHOLHDL 3.1 06/14/2019   Lab Results  Component Value Date   VD25OH 29.1 (L) 01/28/2021   VD25OH 34.8 06/26/2020   VD25OH 26.9 (L) 11/16/2019   Lab Results  Component Value Date   WBC 8.5 07/12/2017   HGB 13.7 07/12/2017   HCT 39.8 07/12/2017   MCV 88.9 07/12/2017   PLT 383.0 07/12/2017   No results found for: IRON, TIBC, FERRITIN  Attestation Statements:   Reviewed by clinician on day of visit: allergies, medications, problem list, medical history, surgical  history, family history, social history, and previous encounter notes.  I, Lizbeth Bark, RMA, am acting as Location manager for [employer], [place].   I have reviewed the above documentation for accuracy and completeness, and I agree with the above. -  Georgianne Fick, FNP

## 2021-02-25 NOTE — Progress Notes (Signed)
The 10-year ASCVD risk score (Arnett DK, et al., 2019) is: 2.7%   Values used to calculate the score:     Age: 55 years     Sex: Female     Is Non-Hispanic African American: No     Diabetic: No     Tobacco smoker: No     Systolic Blood Pressure: 707 mmHg     Is BP treated: Yes     HDL Cholesterol: 57 mg/dL     Total Cholesterol: 217 mg/dL

## 2021-04-01 ENCOUNTER — Ambulatory Visit (INDEPENDENT_AMBULATORY_CARE_PROVIDER_SITE_OTHER): Payer: No Typology Code available for payment source | Admitting: Family Medicine

## 2021-04-01 ENCOUNTER — Other Ambulatory Visit: Payer: Self-pay

## 2021-04-01 ENCOUNTER — Encounter (INDEPENDENT_AMBULATORY_CARE_PROVIDER_SITE_OTHER): Payer: Self-pay | Admitting: Family Medicine

## 2021-04-01 VITALS — BP 134/80 | HR 68 | Temp 97.6°F | Ht 64.0 in | Wt 225.0 lb

## 2021-04-01 DIAGNOSIS — G4733 Obstructive sleep apnea (adult) (pediatric): Secondary | ICD-10-CM | POA: Diagnosis not present

## 2021-04-01 DIAGNOSIS — E559 Vitamin D deficiency, unspecified: Secondary | ICD-10-CM | POA: Diagnosis not present

## 2021-04-01 DIAGNOSIS — Z6837 Body mass index (BMI) 37.0-37.9, adult: Secondary | ICD-10-CM

## 2021-04-01 DIAGNOSIS — E8881 Metabolic syndrome: Secondary | ICD-10-CM

## 2021-04-01 DIAGNOSIS — Z9989 Dependence on other enabling machines and devices: Secondary | ICD-10-CM

## 2021-04-01 MED ORDER — TIRZEPATIDE 12.5 MG/0.5ML ~~LOC~~ SOAJ: 12.5000 mg | SUBCUTANEOUS | 0 refills | Status: DC

## 2021-04-01 MED ORDER — VITAMIN D3 1.25 MG (50000 UT) PO CAPS: 1.0000 | ORAL_CAPSULE | ORAL | 0 refills | Status: DC

## 2021-04-01 NOTE — Progress Notes (Addendum)
Chief Complaint:   OBESITY Theresa Mathews is here to discuss her progress with her obesity treatment plan along with follow-up of her obesity related diagnoses. Theresa Mathews is on the Category 2 Plan and states she is following her eating plan approximately 70% of the time. Theresa Mathews states she is doing 0 minutes 0 times per week.  Today's visit was #: 30 Starting weight: 220 lbs Starting date: 08/26/2017 Today's weight: 225 lbs Today's date: 04/01/2021 Total lbs lost to date: 0 Total lbs lost since last in-office visit: 1 lb  Interim History: Theresa Mathews is glad she did not gain weight over the holidays. She did better with portion control but is still working on fullness cue awareness. She is working on meal planning and has done so for this week. She does not get home from work until 6:30 so cooking dinner is a challenge. Her family tends to get take-out if she does not plan meals.  Subjective:   1. Insulin resistance [name] is on Mounjaro 12.5 mg weekly. She notes good satiety with less food.  Lab Results  Component Value Date   INSULIN 7.7 01/28/2021   INSULIN 7.4 06/26/2020   INSULIN 10.1 11/16/2019   INSULIN 11.0 06/14/2019   INSULIN 7.0 02/02/2019   Lab Results  Component Value Date   HGBA1C 5.1 01/28/2021    2. Vitamin D deficiency Theresa Mathews is doing better with taking Vitamin D doses and always gets in at least one dose per week. She is prescribed 2 per week.  Lab Results  Component Value Date   VD25OH 29.1 (L) 01/28/2021   VD25OH 34.8 06/26/2020   VD25OH 26.9 (L) 11/16/2019    3. OSA on CPAP Theresa Mathews was diagnosis with severe obstructive sleep apnea the end of November and has been using her CPAP consistently. She averages 5-7 hours per night. She feels more rested.  Assessment/Plan:   1. Insulin resistance We will refill Mounjaro 12.5 mg weekly.   - tirzepatide (MOUNJARO) 12.5 MG/0.5ML Pen; Inject 12.5 mg into the skin once a week.  Dispense: 6 mL; Refill: 0  2. Vitamin D  deficiency We will refill prescription Vitamin D 50,000 IU every 3 days for 1 month with no refills and Analyn will follow-up for routine testing of Vitamin D, at least 2-3 times per year to avoid over-replacement. - Cholecalciferol (VITAMIN D3) 1.25 MG (50000 UT) CAPS; Take 1 capsule by mouth every 3 (three) days.  Dispense: 10 capsule; Refill: 0  3. OSA on CPAP Mahlet will continue to use her CPAP consistently. We will continue to monitor.  4. Obesity: BMI 38.6 Theresa Mathews is currently in the action stage of change. As such, her goal is to continue with weight loss efforts. She has agreed to the Category 2 Plan.   Exercise goals:  Theresa Mathews will try to walk on Saturdays and Sundays.   Behavioral modification strategies: increasing lean protein intake.  Theresa Mathews has agreed to follow-up with our clinic in 3-4 weeks.  Objective:   Blood pressure 134/80, pulse 68, temperature 97.6 F (36.4 C), height 5\' 4"  (1.626 m), weight 225 lb (102.1 kg), SpO2 99 %. Body mass index is 38.62 kg/m.  General: Cooperative, alert, well developed, in no acute distress. HEENT: Conjunctivae and lids unremarkable. Cardiovascular: Regular rhythm.  Lungs: Normal work of breathing. Neurologic: No focal deficits.   Lab Results  Component Value Date   CREATININE 0.95 01/28/2021   BUN 19 01/28/2021   NA 142 01/28/2021   K 4.8 01/28/2021  CL 103 01/28/2021   CO2 25 01/28/2021   Lab Results  Component Value Date   ALT 12 01/28/2021   AST 17 01/28/2021   ALKPHOS 103 01/28/2021   BILITOT 0.5 01/28/2021   Lab Results  Component Value Date   HGBA1C 5.1 01/28/2021   HGBA1C 5.6 06/26/2020   HGBA1C 5.6 11/16/2019   HGBA1C 5.2 06/14/2019   HGBA1C 5.4 02/02/2019   Lab Results  Component Value Date   INSULIN 7.7 01/28/2021   INSULIN 7.4 06/26/2020   INSULIN 10.1 11/16/2019   INSULIN 11.0 06/14/2019   INSULIN 7.0 02/02/2019   Lab Results  Component Value Date   TSH 1.290 08/26/2017   Lab Results   Component Value Date   CHOL 217 (H) 01/28/2021   HDL 57 01/28/2021   LDLCALC 140 (H) 01/28/2021   TRIG 111 01/28/2021   CHOLHDL 3.1 06/14/2019   Lab Results  Component Value Date   VD25OH 29.1 (L) 01/28/2021   VD25OH 34.8 06/26/2020   VD25OH 26.9 (L) 11/16/2019   Lab Results  Component Value Date   WBC 8.5 07/12/2017   HGB 13.7 07/12/2017   HCT 39.8 07/12/2017   MCV 88.9 07/12/2017   PLT 383.0 07/12/2017   No results found for: IRON, TIBC, FERRITIN  Attestation Statements:   Reviewed by clinician on day of visit: allergies, medications, problem list, medical history, surgical history, family history, social history, and previous encounter notes.  I, Lizbeth Bark, RMA, am acting as Location manager for [employer], [place].  I have reviewed the above documentation for accuracy and completeness, and I agree with the above. -  Georgianne Fick, FNP

## 2021-04-03 ENCOUNTER — Telehealth (INDEPENDENT_AMBULATORY_CARE_PROVIDER_SITE_OTHER): Payer: Self-pay

## 2021-04-16 ENCOUNTER — Telehealth (INDEPENDENT_AMBULATORY_CARE_PROVIDER_SITE_OTHER): Payer: Self-pay

## 2021-04-16 NOTE — Telephone Encounter (Signed)
Prior Josem Kaufmann has been submitted for [facility], it has been denied.

## 2021-04-16 NOTE — Telephone Encounter (Signed)
Prior auth for Theresa Mathews has been denied.

## 2021-04-23 ENCOUNTER — Other Ambulatory Visit: Payer: Self-pay

## 2021-04-23 ENCOUNTER — Ambulatory Visit (INDEPENDENT_AMBULATORY_CARE_PROVIDER_SITE_OTHER): Payer: No Typology Code available for payment source | Admitting: Family Medicine

## 2021-04-23 ENCOUNTER — Encounter (INDEPENDENT_AMBULATORY_CARE_PROVIDER_SITE_OTHER): Payer: Self-pay | Admitting: Family Medicine

## 2021-04-23 VITALS — BP 119/72 | HR 72 | Temp 98.0°F | Ht 64.0 in | Wt 223.0 lb

## 2021-04-23 DIAGNOSIS — I10 Essential (primary) hypertension: Secondary | ICD-10-CM

## 2021-04-23 DIAGNOSIS — E559 Vitamin D deficiency, unspecified: Secondary | ICD-10-CM

## 2021-04-23 DIAGNOSIS — E669 Obesity, unspecified: Secondary | ICD-10-CM | POA: Diagnosis not present

## 2021-04-23 DIAGNOSIS — Z6838 Body mass index (BMI) 38.0-38.9, adult: Secondary | ICD-10-CM

## 2021-04-23 DIAGNOSIS — E88819 Insulin resistance, unspecified: Secondary | ICD-10-CM

## 2021-04-23 DIAGNOSIS — E8881 Metabolic syndrome: Secondary | ICD-10-CM | POA: Diagnosis not present

## 2021-04-23 DIAGNOSIS — E66812 Obesity, class 2: Secondary | ICD-10-CM

## 2021-04-23 MED ORDER — TIRZEPATIDE 12.5 MG/0.5ML ~~LOC~~ SOAJ: 12.5000 mg | SUBCUTANEOUS | 0 refills | Status: DC

## 2021-04-23 MED ORDER — TELMISARTAN 80 MG PO TABS: 80.0000 mg | ORAL_TABLET | Freq: Every day | ORAL | 0 refills | Status: DC

## 2021-04-23 MED ORDER — VITAMIN D3 1.25 MG (50000 UT) PO CAPS: 1.0000 | ORAL_CAPSULE | ORAL | 0 refills | Status: DC

## 2021-04-23 NOTE — Progress Notes (Signed)
Chief Complaint:   OBESITY Theresa Mathews is here to discuss her progress with her obesity treatment plan along with follow-up of her obesity related diagnoses. Theresa Mathews is on the Category 2 Plan and states she is following her eating plan approximately 70% of the time. Theresa Mathews states she is doing 0 minutes 0 times per week.  Today's visit was #: 68 Starting weight: 220 lbs Starting date: 08/26/2017 Today's weight: 223 lbs Today's date: 04/23/2021 Total lbs lost to date: 0 Total lbs lost since last in-office visit: 2 lbs  Interim History: Theresa Mathews feels she is doing better with plan adherence. She is not snacking at night and eating out much less. She is keeping groceries at work to make her lunch. She is using a meal plan service which reduces eating out at dinner. However, she is frustrated with slow weight loss.   Subjective:   1. Essential hypertension Theresa Mathews's blood pressure is well controlled. She is currently on telmisartan.   BP Readings from Last 3 Encounters:  04/23/21 119/72  04/01/21 134/80  02/25/21 132/85    2. Insulin resistance Tumeka's appetite is well controlled with Mounjaro. She notes mild nausea occasionally.   Lab Results  Component Value Date   INSULIN 7.7 01/28/2021   INSULIN 7.4 06/26/2020   INSULIN 10.1 11/16/2019   INSULIN 11.0 06/14/2019   INSULIN 7.0 02/02/2019   Lab Results  Component Value Date   HGBA1C 5.1 01/28/2021    3. Vitamin D deficiency Theresa Mathews's Vitamin D is low at 29.1. She is on weekly prescription Vitamin D.  Lab Results  Component Value Date   VD25OH 29.1 (L) 01/28/2021   VD25OH 34.8 06/26/2020   VD25OH 26.9 (L) 11/16/2019    Assessment/Plan:   1. Essential hypertension We will refill telmisartan.   - telmisartan (MICARDIS) 80 MG tablet; Take 1 tablet (80 mg total) by mouth daily.  Dispense: 90 tablet; Refill: 0  2. Insulin resistance We will refill Mounjaro 12.5 mg weekly.  - tirzepatide (MOUNJARO) 12.5 MG/0.5ML Pen;  Inject 12.5 mg into the skin once a week.  Dispense: 2 mL; Refill: 0  3. Vitamin D deficiency We will refill prescription Vitamin D 50,000 IU every 3 days and Theresa Mathews will follow-up for routine testing of Vitamin D, at least 2-3 times per year to avoid over-replacement. We will check Vitamin D level in 1-2 months.   - Cholecalciferol (VITAMIN D3) 1.25 MG (50000 UT) CAPS; Take 1 capsule by mouth every 3 (three) days.  Dispense: 10 capsule; Refill: 0  4. Obesity: BMI 38.26 Theresa Mathews is currently in the action stage of change. As such, her goal is to continue with weight loss efforts. She has agreed to the Category 2 Plan.   Exercise goals: All adults should avoid inactivity. Some physical activity is better than none, and adults who participate in any amount of physical activity gain some health benefits.  Behavioral modification strategies: meal planning and cooking strategies and planning for success.  Theresa Mathews has agreed to follow-up with our clinic in 3-4 weeks.  Objective:   Blood pressure 119/72, pulse 72, temperature 98 F (36.7 C), height 5\' 4"  (1.626 m), weight 223 lb (101.2 kg), SpO2 99 %. Body mass index is 38.28 kg/m.  General: Cooperative, alert, well developed, in no acute distress. HEENT: Conjunctivae and lids unremarkable. Cardiovascular: Regular rhythm.  Lungs: Normal work of breathing. Neurologic: No focal deficits.   Lab Results  Component Value Date   CREATININE 0.95 01/28/2021   BUN 19 01/28/2021  NA 142 01/28/2021   K 4.8 01/28/2021   CL 103 01/28/2021   CO2 25 01/28/2021   Lab Results  Component Value Date   ALT 12 01/28/2021   AST 17 01/28/2021   ALKPHOS 103 01/28/2021   BILITOT 0.5 01/28/2021   Lab Results  Component Value Date   HGBA1C 5.1 01/28/2021   HGBA1C 5.6 06/26/2020   HGBA1C 5.6 11/16/2019   HGBA1C 5.2 06/14/2019   HGBA1C 5.4 02/02/2019   Lab Results  Component Value Date   INSULIN 7.7 01/28/2021   INSULIN 7.4 06/26/2020   INSULIN  10.1 11/16/2019   INSULIN 11.0 06/14/2019   INSULIN 7.0 02/02/2019   Lab Results  Component Value Date   TSH 1.290 08/26/2017   Lab Results  Component Value Date   CHOL 217 (H) 01/28/2021   HDL 57 01/28/2021   LDLCALC 140 (H) 01/28/2021   TRIG 111 01/28/2021   CHOLHDL 3.1 06/14/2019   Lab Results  Component Value Date   VD25OH 29.1 (L) 01/28/2021   VD25OH 34.8 06/26/2020   VD25OH 26.9 (L) 11/16/2019   Lab Results  Component Value Date   WBC 8.5 07/12/2017   HGB 13.7 07/12/2017   HCT 39.8 07/12/2017   MCV 88.9 07/12/2017   PLT 383.0 07/12/2017   No results found for: IRON, TIBC, FERRITIN  Attestation Statements:   Reviewed by clinician on day of visit: allergies, medications, problem list, medical history, surgical history, family history, social history, and previous encounter notes.  I, Lizbeth Bark, RMA, am acting as Location manager for [employer], [place].  I have reviewed the above documentation for accuracy and completeness, and I agree with the above. -  Georgianne Fick, FNP

## 2021-04-24 ENCOUNTER — Other Ambulatory Visit: Payer: Self-pay | Admitting: Family Medicine

## 2021-04-24 DIAGNOSIS — Z1231 Encounter for screening mammogram for malignant neoplasm of breast: Secondary | ICD-10-CM

## 2021-05-07 ENCOUNTER — Ambulatory Visit
Admission: RE | Admit: 2021-05-07 | Discharge: 2021-05-07 | Disposition: A | Discharge status: Home / Self Care | Payer: No Typology Code available for payment source | Source: Ambulatory Visit | Attending: Family Medicine | Admitting: Family Medicine

## 2021-05-07 DIAGNOSIS — Z1231 Encounter for screening mammogram for malignant neoplasm of breast: Secondary | ICD-10-CM

## 2021-05-13 ENCOUNTER — Encounter (INDEPENDENT_AMBULATORY_CARE_PROVIDER_SITE_OTHER): Payer: Self-pay | Admitting: Family Medicine

## 2021-05-13 ENCOUNTER — Ambulatory Visit (INDEPENDENT_AMBULATORY_CARE_PROVIDER_SITE_OTHER): Payer: No Typology Code available for payment source | Admitting: Family Medicine

## 2021-05-13 ENCOUNTER — Other Ambulatory Visit: Payer: Self-pay

## 2021-05-13 VITALS — BP 129/81 | HR 74 | Temp 98.0°F | Ht 64.0 in | Wt 220.0 lb

## 2021-05-13 DIAGNOSIS — Z6837 Body mass index (BMI) 37.0-37.9, adult: Secondary | ICD-10-CM

## 2021-05-13 DIAGNOSIS — I10 Essential (primary) hypertension: Secondary | ICD-10-CM

## 2021-05-13 DIAGNOSIS — E8881 Metabolic syndrome: Secondary | ICD-10-CM

## 2021-05-13 DIAGNOSIS — E559 Vitamin D deficiency, unspecified: Secondary | ICD-10-CM

## 2021-05-13 DIAGNOSIS — E669 Obesity, unspecified: Secondary | ICD-10-CM

## 2021-05-13 DIAGNOSIS — F3289 Other specified depressive episodes: Secondary | ICD-10-CM | POA: Diagnosis not present

## 2021-05-13 MED ORDER — BUPROPION HCL ER (XL) 300 MG PO TB24: 300.0000 mg | ORAL_TABLET | Freq: Every day | ORAL | 0 refills | Status: DC

## 2021-05-13 MED ORDER — VITAMIN D3 1.25 MG (50000 UT) PO CAPS: 1.0000 | ORAL_CAPSULE | ORAL | 0 refills | Status: DC

## 2021-05-13 MED ORDER — TIRZEPATIDE 12.5 MG/0.5ML ~~LOC~~ SOAJ: 12.5000 mg | SUBCUTANEOUS | 0 refills | Status: DC

## 2021-05-13 NOTE — Progress Notes (Signed)
Chief Complaint:   OBESITY Theresa Mathews is here to discuss her progress with her obesity treatment plan along with follow-up of her obesity related diagnoses. Theresa Mathews is on the Category 2 Plan and states she is following her eating plan approximately 75% of the time. Theresa Mathews states she is walking for 20 minutes 2 times per week.  Today's visit was #: 5 Starting weight: 220 lbs Starting date: 08/26/2017 Today's weight: 220 lbs Today's date: 05/13/2021 Total lbs lost to date: 0 Total lbs lost since last in-office visit: 3 lbs  Interim History: Theresa Mathews is feeling really good about how she did on the plan over the past few weeks. She made good food choices for Super Bowl. She has cut back on eating out. She is using a meal prep service for dinner. She is meal prepping lunch for work.  Water intake is low-she only drinks 24 ounces of water per day.  Subjective:   1. Insulin resistance Theresa Mathews's appetite is well controlled with Mounjaro. She notes some constipation and takes Citracel.    Lab Results  Component Value Date   HGBA1C 5.1 01/28/2021   Lab Results  Component Value Date   INSULIN 7.7 01/28/2021   INSULIN 7.4 06/26/2020   INSULIN 10.1 11/16/2019   INSULIN 11.0 06/14/2019   INSULIN 7.0 02/02/2019    2. Vitamin D deficiency Theresa Mathews's Vitamin d is low at 29.1. She is prescribed prescription Vitamin D every 3 days. She says compliance is better recently.   Lab Results  Component Value Date   VD25OH 29.1 (L) 01/28/2021   VD25OH 34.8 06/26/2020   VD25OH 26.9 (L) 11/16/2019    3. Essential hypertension Review: taking medications as instructed, no medication side effects noted.  BP Readings from Last 3 Encounters:  05/13/21 129/81  04/23/21 119/72  04/01/21 134/80    4. Other depression with emotional eating [name] mood is stable. She tends to forget the 2nd dose of bupropion. She is prescribed 200 mg 2 times daily.   Assessment/Plan:   1. Insulin resistance We will  refill Mounjaro 12.5 mg weekly. - tirzepatide (MOUNJARO) 12.5 MG/0.5ML Pen; Inject 12.5 mg into the skin once a week.  Dispense: 2 mL; Refill: 0  2. Vitamin D deficiency We will refill prescription Vitamin D 50,000 IU every 3 days and Theresa Mathews will follow-up for routine testing of Vitamin D, at least 2-3 times per year to avoid over-replacement.  - Cholecalciferol (VITAMIN D3) 1.25 MG (50000 UT) CAPS; Take 1 capsule by mouth every 3 (three) days.  Dispense: 10 capsule; Refill: 0  3. Essential hypertension Theresa Mathews is working on healthy weight loss and exercise to improve blood pressure control.   4. Other depression with emotional eating Theresa Mathews will discontinue bupropion 200 mg 2 times daily. Theresa Mathews agrees to start bupropion XL 300 mg daily with no refills.  - buPROPion (WELLBUTRIN XL) 300 MG 24 hr tablet; Take 1 tablet (300 mg total) by mouth daily.  Dispense: 90 tablet; Refill: 0  5. Obesity: BMI 37.74 Theresa Mathews is currently in the action stage of change. As such, her goal is to continue with weight loss efforts. She has agreed to the Category 2 Plan.   We will check IC next office visit.  Exercise goals:  Theresa Mathews will increase frequency of walking to 4-5 days per week.  Behavioral modification strategies: increasing water intake and decreasing eating out.  Theresa Mathews has agreed to follow-up with our clinic in 3-4 weeks.  Objective:   Blood pressure 129/81, pulse  74, temperature 98 F (36.7 C), height 5\' 4"  (1.626 m), weight 220 lb (99.8 kg), SpO2 100 %. Body mass index is 37.76 kg/m.  General: Cooperative, alert, well developed, in no acute distress. HEENT: Conjunctivae and lids unremarkable. Cardiovascular: Regular rhythm.  Lungs: Normal work of breathing. Neurologic: No focal deficits.   Lab Results  Component Value Date   CREATININE 0.95 01/28/2021   BUN 19 01/28/2021   NA 142 01/28/2021   K 4.8 01/28/2021   CL 103 01/28/2021   CO2 25 01/28/2021   Lab Results  Component  Value Date   ALT 12 01/28/2021   AST 17 01/28/2021   ALKPHOS 103 01/28/2021   BILITOT 0.5 01/28/2021   Lab Results  Component Value Date   HGBA1C 5.1 01/28/2021   HGBA1C 5.6 06/26/2020   HGBA1C 5.6 11/16/2019   HGBA1C 5.2 06/14/2019   HGBA1C 5.4 02/02/2019   Lab Results  Component Value Date   INSULIN 7.7 01/28/2021   INSULIN 7.4 06/26/2020   INSULIN 10.1 11/16/2019   INSULIN 11.0 06/14/2019   INSULIN 7.0 02/02/2019   Lab Results  Component Value Date   TSH 1.290 08/26/2017   Lab Results  Component Value Date   CHOL 217 (H) 01/28/2021   HDL 57 01/28/2021   LDLCALC 140 (H) 01/28/2021   TRIG 111 01/28/2021   CHOLHDL 3.1 06/14/2019   Lab Results  Component Value Date   VD25OH 29.1 (L) 01/28/2021   VD25OH 34.8 06/26/2020   VD25OH 26.9 (L) 11/16/2019   Lab Results  Component Value Date   WBC 8.5 07/12/2017   HGB 13.7 07/12/2017   HCT 39.8 07/12/2017   MCV 88.9 07/12/2017   PLT 383.0 07/12/2017   No results found for: IRON, TIBC, FERRITIN  Attestation Statements:   Reviewed by clinician on day of visit: allergies, medications, problem list, medical history, surgical history, family history, social history, and previous encounter notes.  I, Lizbeth Bark, RMA, am acting as Location manager for [employer], [place].  I have reviewed the above documentation for accuracy and completeness, and I agree with the above. -  Georgianne Fick, FNP

## 2021-05-14 ENCOUNTER — Encounter (INDEPENDENT_AMBULATORY_CARE_PROVIDER_SITE_OTHER): Payer: Self-pay | Admitting: Family Medicine

## 2021-05-14 ENCOUNTER — Ambulatory Visit (INDEPENDENT_AMBULATORY_CARE_PROVIDER_SITE_OTHER): Payer: No Typology Code available for payment source | Admitting: Family Medicine

## 2021-05-15 ENCOUNTER — Ambulatory Visit: Payer: No Typology Code available for payment source | Admitting: Adult Health

## 2021-05-15 ENCOUNTER — Encounter: Payer: Self-pay | Admitting: Adult Health

## 2021-05-15 VITALS — BP 132/81 | HR 74 | Ht 64.0 in | Wt 223.6 lb

## 2021-05-15 DIAGNOSIS — Z9989 Dependence on other enabling machines and devices: Secondary | ICD-10-CM | POA: Diagnosis not present

## 2021-05-15 DIAGNOSIS — G4733 Obstructive sleep apnea (adult) (pediatric): Secondary | ICD-10-CM

## 2021-05-15 NOTE — Progress Notes (Signed)
PATIENT: Theresa Mathews DOB: 06-13-1965  REASON FOR VISIT: follow up HISTORY FROM: patient PRIMARY NEUROLOGIST: Dr. Rexene Alberts  HISTORY OF PRESENT ILLNESS: Today 05/15/21:  Theresa Mathews is a 56 year old female with a history of obstructive sleep apnea on CPAP.  She returns today for follow-up.  Her download indicates that she used her machine nightly for compliance of 100%.  She used her machine greater than 4 hours for compliance of 77%.  On average she uses her machine 5 and half hours most nights.  Her residual AHI is 1 on 6 to 14 cm of water.  She reports that she has noticed the benefit with the CPAP.  She states that she feels like she is getting better sleep and more rested during the day.  Returns today for an evaluation.  HISTORY (copied from Dr. Guadelupe Sabin note)  Theresa Mathews is a 56 year old right-handed woman with an underlying medical history of reflux disease, lactose intolerance, insulin resistance, vitamin D deficiency, hypertension, hyperlipidemia and obesity, who reports snoring and excessive daytime somnolence.  She has woken up with a sense of gasping for air.  I reviewed your office note from 09/16/2020.  Her Epworth sleepiness score is 14 out of 24, fatigue severity score is 44 out of 63.  She has snored for years, her daytime somnolence has increased over time and her snoring has worsened especially with weight gain.  She has had trouble losing weight.  She follows with weight management.  She just recently started Mounjaro injections 8 weeks ago.  She has lost a little bit of weight thus far.  She has been on Wellbutrin but admits that she is not very good about taking day-to-day oral medications.  She goes to bed around 11 and rise time is between 630 and 7.  She has nocturia once per average night and has occasionally woken up with a headache in the morning, this is a dull, achy, mild frontal headache and sometimes she takes Excedrin.  She does not have a history of migraines.   Her mom snores and has a diagnosis of A. fib.  Patient reports feeling tired all the time and having lack of energy.  They have 1 dog in the household, she lives with her husband and youngest daughter.  She has 2 children altogether.  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  Her commute is now only 10 minutes.  When she had a longer commute she does report feeling sleepy at the wheel and has dozed off in the past at the wheel, thankfully never had a car accident.  She drinks caffeine in the form of coffee or tea, 1 or 2 cups in the mornings, occasional soda, not daily, drinks alcohol about 3 days out of the week and is a non-smoker.  She has no TV in the bedroom.   REVIEW OF SYSTEMS: Out of a complete 14 system review of symptoms, the patient complains only of the following symptoms, and all other reviewed systems are negative.   ESS 8  ALLERGIES: No Known Allergies  HOME MEDICATIONS: Outpatient Medications Prior to Visit  Medication Sig Dispense Refill   buPROPion (WELLBUTRIN XL) 300 MG 24 hr tablet Take 1 tablet (300 mg total) by mouth daily. 90 tablet 0   Cholecalciferol (VITAMIN D3) 1.25 MG (50000 UT) CAPS Take 1 capsule by mouth every 3 (three) days. 10 capsule 0   citalopram (CELEXA) 20 MG tablet Take 1 tablet (20 mg total) by mouth daily.  90 tablet 0   ranitidine (ZANTAC) 75 MG tablet Take 75 mg by mouth daily at 6 (six) AM.      telmisartan (MICARDIS) 80 MG tablet Take 1 tablet (80 mg total) by mouth daily. 90 tablet 0   tirzepatide (MOUNJARO) 12.5 MG/0.5ML Pen Inject 12.5 mg into the skin once a week. 2 mL 0   No facility-administered medications prior to visit.    PAST MEDICAL HISTORY: Past Medical History:  Diagnosis Date   Anxiety    Back pain    Depression    Fatigue    GERD (gastroesophageal reflux disease)    Lactose intolerance     PAST SURGICAL HISTORY: Past Surgical History:  Procedure Laterality Date   BLADDER SUSPENSION N/A 01/23/2015    Procedure: TRANSVAGINAL TAPE (TVT) PROCEDURE;  Surgeon: Bobbye Charleston, MD;  Location: [facility];  Service: Gynecology;  Laterality: N/A;   CYSTOSCOPY N/A 01/23/2015   Procedure: CYSTOSCOPY;  Surgeon: Bobbye Charleston, MD;  Location: [facility];  Service: Gynecology;  Laterality: N/A;   DILATION AND CURETTAGE OF UTERUS  2005   ROBOTIC ASSISTED TOTAL HYSTERECTOMY WITH BILATERAL SALPINGO OOPHERECTOMY Bilateral 01/23/2015   Procedure: ROBOTIC ASSISTED TOTAL HYSTERECTOMY WITH BILATERAL SALPINGO OOPHORECTOMY;  Surgeon: Bobbye Charleston, MD;  Location: [facility];  Service: Gynecology;  Laterality: Bilateral;    FAMILY HISTORY: Family History  Problem Relation Age of Onset   Hypertension Mother    Ovarian cancer Mother    Heart disease Mother 49       a fib   Obesity Mother    Hypertension Father    Diabetes Father    Hyperlipidemia Father    Obesity Father     SOCIAL HISTORY: Social History   Socioeconomic History   Marital status: Married    Spouse name: Terrion Poblano   Number of children: 2   Years of education: Not on file   Highest education level: Not on file  Occupational History   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    Comment: [employer]  Tobacco Use   Smoking status: Former    Packs/day: 0.50    Years: 4.00    Pack years: 2.00    Types: Cigarettes    Quit date: 1994    Years since quitting: 29.1   Smokeless tobacco: Never   Tobacco comments:    smoked in college  Vaping Use   Vaping Use: Never used  Substance and Sexual Activity   Alcohol use: Yes    Alcohol/week: 5.0 standard drinks    Types: 5 Glasses of wine per week   Drug use: No   Sexual activity: Yes    Partners: Male  Other Topics Concern   Not on file  Social History Narrative   Exercise- walks 4 times a week   Caffeine: 1-2 cups daily   Education: BS textile [place]   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    Social Determinants of Health   Financial Resource Strain: Not on file  Food Insecurity:  Not on file  Transportation Needs: Not on file  Physical Activity: Not on file  Stress: Not on file  Social Connections: Not on file  Intimate Partner Violence: Not on file      PHYSICAL EXAM  Vitals:   05/15/21 1109  BP: 132/81  Pulse: 74  Weight: 223 lb 9.6 oz (101.4 kg)  Height: 5\' 4"  (1.626 m)   Body mass index is 38.38 kg/m.  Generalized: Well developed, in no acute distress  Chest: Lungs clear  to auscultation bilaterally  Neurological examination  Mentation: Alert oriented to time, place, history taking. Follows all commands speech and language fluent Cranial nerve II-XII: Extraocular movements were full, visual field were full on confrontational test Head turning and shoulder shrug  were normal and symmetric. Motor: The motor testing reveals 5 over 5 strength of all 4 extremities. Good symmetric motor tone is noted throughout.  Sensory: Sensory testing is intact to soft touch on all 4 extremities. No evidence of extinction is noted.  Gait and station: Gait is normal.    DIAGNOSTIC DATA (LABS, IMAGING, TESTING) - I reviewed patient records, labs, notes, testing and imaging myself where available.  Lab Results  Component Value Date   WBC 8.5 07/12/2017   HGB 13.7 07/12/2017   HCT 39.8 07/12/2017   MCV 88.9 07/12/2017   PLT 383.0 07/12/2017      Component Value Date/Time   NA 142 01/28/2021 0755   K 4.8 01/28/2021 0755   CL 103 01/28/2021 0755   CO2 25 01/28/2021 0755   GLUCOSE 90 01/28/2021 0755   GLUCOSE 97 07/12/2017 1011   BUN 19 01/28/2021 0755   CREATININE 0.95 01/28/2021 0755   CALCIUM 9.2 01/28/2021 0755   PROT 6.9 01/28/2021 0755   ALBUMIN 4.5 01/28/2021 0755   AST 17 01/28/2021 0755   ALT 12 01/28/2021 0755   ALKPHOS 103 01/28/2021 0755   BILITOT 0.5 01/28/2021 0755   GFRNONAA 87 11/16/2019 1252   GFRAA 100 11/16/2019 1252   Lab Results  Component Value Date   CHOL 217 (H) 01/28/2021   HDL 57 01/28/2021   LDLCALC 140 (H) 01/28/2021    TRIG 111 01/28/2021   CHOLHDL 3.1 06/14/2019   Lab Results  Component Value Date   HGBA1C 5.1 01/28/2021   Lab Results  Component Value Date   VITAMINB12 536 02/02/2019   Lab Results  Component Value Date   TSH 1.290 08/26/2017      ASSESSMENT AND PLAN 56 y.o. year old female  has a past medical history of Anxiety, Back pain, Depression, Fatigue, GERD (gastroesophageal reflux disease), and Lactose intolerance. here with:  OSA on CPAP  - CPAP compliance excellent - Good treatment of AHI  - Encourage patient to use CPAP nightly and > 4 hours each night - F/U in 1 year or sooner if needed   Ward Givens, MSN, NP-C 05/15/2021, 11:19 AM [facility] [address], [place], [place] [address] [phone]

## 2021-06-11 ENCOUNTER — Other Ambulatory Visit: Payer: Self-pay

## 2021-06-11 ENCOUNTER — Encounter (INDEPENDENT_AMBULATORY_CARE_PROVIDER_SITE_OTHER): Payer: Self-pay | Admitting: Physician Assistant

## 2021-06-11 ENCOUNTER — Ambulatory Visit (INDEPENDENT_AMBULATORY_CARE_PROVIDER_SITE_OTHER): Payer: No Typology Code available for payment source | Admitting: Physician Assistant

## 2021-06-11 VITALS — BP 123/80 | HR 82 | Temp 98.1°F | Ht 64.0 in | Wt 218.0 lb

## 2021-06-11 DIAGNOSIS — E669 Obesity, unspecified: Secondary | ICD-10-CM

## 2021-06-11 DIAGNOSIS — Z6837 Body mass index (BMI) 37.0-37.9, adult: Secondary | ICD-10-CM

## 2021-06-11 DIAGNOSIS — Z9189 Other specified personal risk factors, not elsewhere classified: Secondary | ICD-10-CM

## 2021-06-11 DIAGNOSIS — E559 Vitamin D deficiency, unspecified: Secondary | ICD-10-CM | POA: Diagnosis not present

## 2021-06-11 DIAGNOSIS — E8881 Metabolic syndrome: Secondary | ICD-10-CM

## 2021-06-11 MED ORDER — VITAMIN D3 1.25 MG (50000 UT) PO CAPS: 1.0000 | ORAL_CAPSULE | ORAL | 0 refills | Status: DC

## 2021-06-11 MED ORDER — TIRZEPATIDE 12.5 MG/0.5ML ~~LOC~~ SOAJ: 12.5000 mg | SUBCUTANEOUS | 0 refills | Status: DC

## 2021-06-12 NOTE — Progress Notes (Signed)
? ? ? ?Chief Complaint:  ? ?OBESITY ?Theresa Mathews is here to discuss her progress with her obesity treatment plan along with follow-up of her obesity related diagnoses. Theresa Mathews is on the Category 2 Plan and states she is following her eating plan approximately 70% of the time. Theresa Mathews states she is walking for 20 minutes 3 times per week. ? ?Today's visit was #: 7 ?Starting weight: 220 lbs ?Starting date: 08/26/2017 ?Today's weight: 218 lbs ?Today's date: 06/11/2021 ?Total lbs lost to date: 2 lbs ?Total lbs lost since last in-office visit: 2 lbs ? ?Interim History: Theresa Mathews reports that this weekend she did some emotional/stress eating, eating when she was not hungry. Her work has been more stressful and she cares for her disabled husband. She is doing a meal prep company for dinner. She is not drinking enough water, getting around 40 ounces daily.  ? ?Subjective:  ? ?1. Insulin resistance ?Theresa Mathews reports that she has noticed a decreased in appetite since being on Mounjaro. ? ?2. Vitamin D deficiency ?Theresa Mathews is on Vitamin D currently. Her last level was 29.1. She is not taking as prescribed consistently.  ? ?3. At risk for medication nonadherence ?Theresa Mathews is at a higher than average risk for medication non-adherence due to not taking her medications as prescribed.  ? ?Assessment/Plan:  ? ?1. Insulin resistance ?We will refill Mounjaro 12.5 mg for 1 month with no refills Theresa Mathews will continue to work on weight loss, exercise, and decreasing simple carbohydrates to help decrease the risk of diabetes. Theresa Mathews agreed to follow-up with [name] as directed to closely monitor her progress. ? ?- tirzepatide (MOUNJARO) 12.5 MG/0.5ML Pen; Inject 12.5 mg into the skin once a week.  Dispense: 2 mL; Refill: 0 ? ?2. Vitamin D deficiency ?Low Vitamin D level contributes to fatigue and are associated with obesity, breast, and colon cancer. We will refill prescription Vitamin D 50,000 IU every 3 days for 1 month with no refills and Theresa Mathews will  follow-up for routine testing of Vitamin D, at least 2-3 times per year to avoid over-replacement. ? ?- Cholecalciferol (VITAMIN D3) 1.25 MG (50000 UT) CAPS; Take 1 capsule by mouth every 3 (three) days.  Dispense: 10 capsule; Refill: 0 ? ?3. At risk for medication nonadherence ?Theresa Mathews was given approximately 15 minutes of counseling today to help her avoid medication non-adherence.  We discussed importance of taking medications at a similar time each day and the use of daily pill organizers to help improve medication adherence. ? ?Repetitive spaced learning was employed today to elicit superior memory formation and behavioral change.  ? ?4. Obesity: BMI 37.4 ?Theresa Mathews is currently in the action stage of change. As such, her goal is to continue with weight loss efforts. She has agreed to the Category 2 Plan.  ? ?We will recheck Theresa Mathews's IC.  ? ?Exercise goals:  As is. ? ?Behavioral modification strategies: increasing water intake and meal planning and cooking strategies. ? ?Theresa Mathews has agreed to follow-up with our clinic in 3-4 weeks. She was informed of the importance of frequent follow-up visits to maximize her success with intensive lifestyle modifications for her multiple health conditions.  ? ?Objective:  ? ?Blood pressure 123/80, pulse 82, temperature 98.1 ?F (36.7 ?C), height '5\' 4"'$  (1.626 m), weight 218 lb (98.9 kg), SpO2 99 %. ?Body mass index is 37.42 kg/m?. ? ?General: Cooperative, alert, well developed, in no acute distress. ?HEENT: Conjunctivae and lids unremarkable. ?Cardiovascular: Regular rhythm.  ?Lungs: Normal work of breathing. ?Neurologic: No focal deficits.  ? ?  Lab Results  ?Component Value Date  ? CREATININE 0.95 01/28/2021  ? BUN 19 01/28/2021  ? NA 142 01/28/2021  ? K 4.8 01/28/2021  ? CL 103 01/28/2021  ? CO2 25 01/28/2021  ? ?Lab Results  ?Component Value Date  ? ALT 12 01/28/2021  ? AST 17 01/28/2021  ? ALKPHOS 103 01/28/2021  ? BILITOT 0.5 01/28/2021  ? ?Lab Results  ?Component Value Date  ?  HGBA1C 5.1 01/28/2021  ? HGBA1C 5.6 06/26/2020  ? HGBA1C 5.6 11/16/2019  ? HGBA1C 5.2 06/14/2019  ? HGBA1C 5.4 02/02/2019  ? ?Lab Results  ?Component Value Date  ? INSULIN 7.7 01/28/2021  ? INSULIN 7.4 06/26/2020  ? INSULIN 10.1 11/16/2019  ? INSULIN 11.0 06/14/2019  ? INSULIN 7.0 02/02/2019  ? ?Lab Results  ?Component Value Date  ? TSH 1.290 08/26/2017  ? ?Lab Results  ?Component Value Date  ? CHOL 217 (H) 01/28/2021  ? HDL 57 01/28/2021  ? LDLCALC 140 (H) 01/28/2021  ? TRIG 111 01/28/2021  ? CHOLHDL 3.1 06/14/2019  ? ?Lab Results  ?Component Value Date  ? VD25OH 29.1 (L) 01/28/2021  ? VD25OH 34.8 06/26/2020  ? VD25OH 26.9 (L) 11/16/2019  ? ?Lab Results  ?Component Value Date  ? WBC 8.5 07/12/2017  ? HGB 13.7 07/12/2017  ? HCT 39.8 07/12/2017  ? MCV 88.9 07/12/2017  ? PLT 383.0 07/12/2017  ? ?No results found for: IRON, TIBC, FERRITIN ? ?Attestation Statements:  ? ?Reviewed by clinician on day of visit: allergies, medications, problem list, medical history, surgical history, family history, social history, and previous encounter notes. ? ?I, Tonye Pearson, am acting as Location manager for [employer], PA-C. ? ?I have reviewed the above documentation for accuracy and completeness, and I agree with the above. Abby Potash, PA-C ? ?

## 2021-06-24 NOTE — Progress Notes (Signed)
?TeleHealth Visit:  ?Due to the COVID-19 pandemic, this visit was completed with telemedicine (audio/video) technology to reduce patient and provider exposure as well as to preserve personal protective equipment.  ? ?Theresa Mathews has verbally consented to this TeleHealth visit. The patient is located at home, the provider is located at home. The participants in this visit include the listed provider and patient. The visit was conducted today via MyChart video. ? ?OBESITY ?Theresa Mathews is here to discuss her progress with her obesity treatment plan along with follow-up of her obesity related diagnoses.  ? ?Today's visit was # 46 ?Starting weight: 220 lbs ?Starting date: 08/26/2017 ?Total weight loss: 2 lbs at last in person visit ?Weight at last in person visit: 218 lbs ?Today's reported weight: 219 lbs (her scales at home weigh 2 pounds heavier than the office scales) ? ?Nutrition Plan: the Category 2 Plan.  ?Hunger is moderately controlled. Cravings are moderately controlled.  ?Current exercise: walks at lunch 3 times per week. ? ?Interim History: Theresa Mathews notes a great deal of stress related to her husband's chronic illness.  He has MS which has worsened recently and impacted his mobility.  She is his caregiver and also carries the weight of taking care of household issues as well as working full-time.  She has continued to do well overall but reports she had a few instances where she just ate what she wanted.  She continues to use a meal prep service for dinner which is working well for her. ?She has done better with water intake and has taken in as much is 80 ounces a few days. ?She has lost 15 lbs since Theresa Mathews was started in August 2022 when she was 233 lbs. ?She is on 12.5 mg of Mounjaro weekly. ? ?Assessment/Plan:  ?1. Other depression with emotional eating ?Shye is dealing with some personal issues which is impacting her mood and eating to some extent.  She is prescribed bupropion 300 mg XL daily and  citalopram 20 mg daily but her compliance is not always good.  She says she takes measures to remember and then remembers briefly and does not do it immediately and then forgets again.  She is not currently seeing a counselor but has seen one in the past. ? ?Plan: ?Continue both bupropion and citalopram at current doses. ?Discussed measures to improve her adherence with these medications and stressed the importance of compliance. ?I encouraged her to make an appointment with her counselor. ? ?2. Insulin Resistance ?Lakendria has had elevated fasting insulin readings. Goal is HgbA1c < 5.7, fasting insulin closer to 5.   ?She denies polyphagia.  ?Medication(s): Mounjaro 12.5 mg weekly.  She has occasional constipation due to the [name]. ?Lab Results  ?Component Value Date  ? HGBA1C 5.1 01/28/2021  ? ?Lab Results  ?Component Value Date  ? INSULIN 7.7 01/28/2021  ? INSULIN 7.4 06/26/2020  ? INSULIN 10.1 11/16/2019  ? INSULIN 11.0 06/14/2019  ? INSULIN 7.0 02/02/2019  ? ? ?Plan ?Continue Mounjaro at 12.5 mg weekly. ? ?Obesity: Current BMI 37.4 ?Theresa Mathews is currently in the action stage of change. As such, her goal is to continue with weight loss efforts. She has agreed to the Category 2 Plan.  ? ?Exercise goals: Continue current regimen plus she would like to begin doing some body weight exercises a few days per week. ? ?Behavioral modification strategies: increasing lean protein intake, decreasing simple carbohydrates, increasing water intake, emotional eating strategies, and planning for success. ?I encouraged [name] to be kind to herself  because she tends to be very hard on herself. ? ?Theresa Mathews has agreed to follow-up with our clinic in 2 weeks.  At that time she will have a repeat IC. ? ?No orders of the defined types were placed in this encounter. ? ? ?There are no discontinued medications.  ? ?No orders of the defined types were placed in this encounter. ?   ? ?Objective:  ? ?VITALS: Per patient if applicable, see  vitals. ?GENERAL: Alert and in no acute distress. ?CARDIOPULMONARY: No increased WOB. Speaking in clear sentences.  ?PSYCH: Pleasant and cooperative. Speech normal rate and rhythm. Affect is appropriate. Insight and judgement are appropriate. Attention is focused, linear, and appropriate.  ?NEURO: Oriented as arrived to appointment on time with no prompting.  ? ?Lab Results  ?Component Value Date  ? CREATININE 0.95 01/28/2021  ? BUN 19 01/28/2021  ? NA 142 01/28/2021  ? K 4.8 01/28/2021  ? CL 103 01/28/2021  ? CO2 25 01/28/2021  ? ?Lab Results  ?Component Value Date  ? ALT 12 01/28/2021  ? AST 17 01/28/2021  ? ALKPHOS 103 01/28/2021  ? BILITOT 0.5 01/28/2021  ? ?Lab Results  ?Component Value Date  ? HGBA1C 5.1 01/28/2021  ? HGBA1C 5.6 06/26/2020  ? HGBA1C 5.6 11/16/2019  ? HGBA1C 5.2 06/14/2019  ? HGBA1C 5.4 02/02/2019  ? ?Lab Results  ?Component Value Date  ? INSULIN 7.7 01/28/2021  ? INSULIN 7.4 06/26/2020  ? INSULIN 10.1 11/16/2019  ? INSULIN 11.0 06/14/2019  ? INSULIN 7.0 02/02/2019  ? ?Lab Results  ?Component Value Date  ? TSH 1.290 08/26/2017  ? ?Lab Results  ?Component Value Date  ? CHOL 217 (H) 01/28/2021  ? HDL 57 01/28/2021  ? LDLCALC 140 (H) 01/28/2021  ? TRIG 111 01/28/2021  ? CHOLHDL 3.1 06/14/2019  ? ?Lab Results  ?Component Value Date  ? WBC 8.5 07/12/2017  ? HGB 13.7 07/12/2017  ? HCT 39.8 07/12/2017  ? MCV 88.9 07/12/2017  ? PLT 383.0 07/12/2017  ? ?No results found for: IRON, TIBC, FERRITIN ?Lab Results  ?Component Value Date  ? VD25OH 29.1 (L) 01/28/2021  ? VD25OH 34.8 06/26/2020  ? VD25OH 26.9 (L) 11/16/2019  ? ? ?Attestation Statements:  ? ?Reviewed by clinician on day of visit: allergies, medications, problem list, medical history, surgical history, family history, social history, and previous encounter notes. ? ?Time spent on visit including pre-visit chart review and post-visit charting and care was 32 minutes. ? ?

## 2021-06-25 ENCOUNTER — Encounter (INDEPENDENT_AMBULATORY_CARE_PROVIDER_SITE_OTHER): Payer: Self-pay | Admitting: Family Medicine

## 2021-06-25 ENCOUNTER — Telehealth (INDEPENDENT_AMBULATORY_CARE_PROVIDER_SITE_OTHER): Payer: No Typology Code available for payment source | Admitting: Family Medicine

## 2021-06-25 DIAGNOSIS — E8881 Metabolic syndrome: Secondary | ICD-10-CM

## 2021-06-25 DIAGNOSIS — Z6837 Body mass index (BMI) 37.0-37.9, adult: Secondary | ICD-10-CM

## 2021-06-25 DIAGNOSIS — F3289 Other specified depressive episodes: Secondary | ICD-10-CM | POA: Diagnosis not present

## 2021-06-25 DIAGNOSIS — E669 Obesity, unspecified: Secondary | ICD-10-CM | POA: Diagnosis not present

## 2021-06-25 DIAGNOSIS — E88819 Insulin resistance, unspecified: Secondary | ICD-10-CM

## 2021-07-09 ENCOUNTER — Encounter (INDEPENDENT_AMBULATORY_CARE_PROVIDER_SITE_OTHER): Payer: Self-pay | Admitting: Physician Assistant

## 2021-07-09 ENCOUNTER — Ambulatory Visit (INDEPENDENT_AMBULATORY_CARE_PROVIDER_SITE_OTHER): Payer: No Typology Code available for payment source | Admitting: Physician Assistant

## 2021-07-09 VITALS — BP 124/75 | HR 64 | Temp 97.9°F | Ht 64.0 in | Wt 217.0 lb

## 2021-07-09 DIAGNOSIS — Z9189 Other specified personal risk factors, not elsewhere classified: Secondary | ICD-10-CM

## 2021-07-09 DIAGNOSIS — E669 Obesity, unspecified: Secondary | ICD-10-CM | POA: Diagnosis not present

## 2021-07-09 DIAGNOSIS — E7849 Other hyperlipidemia: Secondary | ICD-10-CM

## 2021-07-09 DIAGNOSIS — E559 Vitamin D deficiency, unspecified: Secondary | ICD-10-CM | POA: Diagnosis not present

## 2021-07-09 DIAGNOSIS — E8881 Metabolic syndrome: Secondary | ICD-10-CM | POA: Diagnosis not present

## 2021-07-09 DIAGNOSIS — R0602 Shortness of breath: Secondary | ICD-10-CM

## 2021-07-09 DIAGNOSIS — Z6837 Body mass index (BMI) 37.0-37.9, adult: Secondary | ICD-10-CM

## 2021-07-09 MED ORDER — TIRZEPATIDE 12.5 MG/0.5ML ~~LOC~~ SOAJ: 12.5000 mg | SUBCUTANEOUS | 0 refills | Status: DC

## 2021-07-09 MED ORDER — VITAMIN D3 1.25 MG (50000 UT) PO CAPS: 1.0000 | ORAL_CAPSULE | ORAL | 0 refills | Status: DC

## 2021-07-10 LAB — COMPREHENSIVE METABOLIC PANEL
ALT: 15 IU/L (ref 0–32)
AST: 16 IU/L (ref 0–40)
Albumin/Globulin Ratio: 2 (ref 1.2–2.2)
Albumin: 4.5 g/dL (ref 3.8–4.9)
Alkaline Phosphatase: 104 IU/L (ref 44–121)
BUN/Creatinine Ratio: 21 (ref 9–23)
BUN: 21 mg/dL (ref 6–24)
Bilirubin Total: 0.5 mg/dL (ref 0.0–1.2)
CO2: 22 mmol/L (ref 20–29)
Calcium: 9.5 mg/dL (ref 8.7–10.2)
Chloride: 100 mmol/L (ref 96–106)
Creatinine, Ser: 0.99 mg/dL (ref 0.57–1.00)
Globulin, Total: 2.3 g/dL (ref 1.5–4.5)
Glucose: 88 mg/dL (ref 70–99)
Potassium: 4.9 mmol/L (ref 3.5–5.2)
Sodium: 142 mmol/L (ref 134–144)
Total Protein: 6.8 g/dL (ref 6.0–8.5)
eGFR: 67 mL/min/{1.73_m2} (ref >59)

## 2021-07-10 LAB — LIPID PANEL
Chol/HDL Ratio: 3.9 ratio (ref 0.0–4.4)
Cholesterol, Total: 226 mg/dL — ABNORMAL HIGH (ref 100–199)
HDL: 58 mg/dL (ref >39)
LDL Chol Calc (NIH): 149 mg/dL — ABNORMAL HIGH (ref 0–99)
Triglycerides: 106 mg/dL (ref 0–149)
VLDL Cholesterol Cal: 19 mg/dL (ref 5–40)

## 2021-07-10 LAB — HEMOGLOBIN A1C
Est. average glucose Bld gHb Est-mCnc: 100 mg/dL
Hgb A1c MFr Bld: 5.1 % (ref 4.8–5.6)

## 2021-07-10 LAB — INSULIN, RANDOM: INSULIN: 7.3 u[IU]/mL (ref 2.6–24.9)

## 2021-07-10 LAB — VITAMIN D 25 HYDROXY (VIT D DEFICIENCY, FRACTURES): Vit D, 25-Hydroxy: 87.1 ng/mL (ref 30.0–100.0)

## 2021-07-16 NOTE — Progress Notes (Signed)
? ? ? ?Chief Complaint:  ? ?OBESITY ?Theresa Mathews is here to discuss her progress with her obesity treatment plan along with follow-up of her obesity related diagnoses. Theresa Mathews is on the Category 2 Plan and states she is following her eating plan approximately 70% of the time. Theresa Mathews states she is walking for 20-30 minutes 3 times per week. ? ?Today's visit was #: 70 ?Starting weight: 220 lbs ?Starting date: 08/26/2017 ?Today's weight: 217 lbs ?Today's date: 07/09/2021 ?Total lbs lost to date: 3 lbs ?Total lbs lost since last in-office visit: 1 lb ? ?Interim History: Theresa Mathews reports she is doing well for 4-5 days and then "goes off the rails" with her plan. She gets bored with eating the same things. She is willing to journal. We  rechecked  RMR today and it shows an increase in her metabolism.  ? ?Subjective:  ? ?1. Vitamin D deficiency ?Theresa Mathews is currently taking Vitamin D every 3 days.  ? ?2. Other hyperlipidemia ?Theresa Mathews is currently not on medications. Her last lipid panel was not at goal. She is walking 2-3 times per week.  ? ?3. Insulin resistance ?Theresa Mathews is currently on Mounjaro 12.5 mg weekly. She notes decreased appetite.  ? ?4. Shortness of breath on exertion ?Theresa Mathews notes shortness of breath on exertion. She denies dizziness or lightheadedness.  ? ?5. At risk for heart disease ?Theresa Mathews is at higher than average risk for cardiovascular disease due to obesity.  ? ?Assessment/Plan:  ? ?1. Vitamin D deficiency ?Low Vitamin D level contributes to fatigue and are associated with obesity, breast, and colon cancer. We will refill prescription Vitamin D 50,000 IU every week for 1 month with no refills and Theresa Mathews will follow-up for routine testing of Vitamin D, at least 2-3 times per year to avoid over-replacement. We will check labs today.  ? ?- VITAMIN D 25 Hydroxy (Vit-D Deficiency, Fractures) ?- Cholecalciferol (VITAMIN D3) 1.25 MG (50000 UT) CAPS; Take 1 capsule by mouth every 3 (three) days.  Dispense: 10 capsule;  Refill: 0 ? ?2. Other hyperlipidemia ?Cardiovascular risk and specific lipid/LDL goals reviewed.  We will check labs today. Theresa Mathews will continue with the plan and exercise. We discussed several lifestyle modifications today and Lashya will continue to work on diet, exercise and weight loss efforts. Orders and follow up as documented in patient record.  ? ?Counseling ?Intensive lifestyle modifications are the first line treatment for this issue. ?Dietary changes: Increase soluble fiber. Decrease simple carbohydrates. ?Exercise changes: Moderate to vigorous-intensity aerobic activity 150 minutes per week if tolerated. ?Lipid-lowering medications: see documented in medical record. ? ?- Lipid panel ? ?3. Insulin resistance ?We will refill Mounjaro 12.5 mg for 1 month with no refills. We will check labs today. Theresa Mathews will continue to work on weight loss, exercise, and decreasing simple carbohydrates to help decrease the risk of diabetes. Theresa Mathews agreed to follow-up with [name] as directed to closely monitor her progress. ? ?- Comprehensive metabolic panel ?- Hemoglobin A1c ?- Insulin, random ?- tirzepatide (MOUNJARO) 12.5 MG/0.5ML Pen; Inject 12.5 mg into the skin once a week.  Dispense: 2 mL; Refill: 0 ? ?4. Shortness of breath on exertion ?We will check IC today. Theresa Mathews does feel that she gets out of breath more easily that she used to when she exercises. Theresa Mathews's shortness of breath appears to be obesity related and exercise induced. She has agreed to work on weight loss and gradually increase exercise to treat her exercise induced shortness of breath. Will continue to monitor closely.  ? ?  5. At risk for heart disease ?Theresa Mathews was given approximately 15 minutes of coronary artery disease prevention counseling today. She is 56 y.o. female and has risk factors for heart disease including obesity. We discussed intensive lifestyle modifications today with an emphasis on specific weight loss instructions and  strategies. ? ?Repetitive spaced learning was employed today to elicit superior memory formation and behavioral change.   ? ?6. Obesity: BMI 37.23 ?Theresa Mathews is currently in the action stage of change. As such, her goal is to continue with weight loss efforts. She has agreed to keeping a food journal and adhering to recommended goals of 1200-1400 calories and 95 grams of protein daily.  ? ?Exercise goals:  As is.  ? ?Behavioral modification strategies: increasing lean protein intake, meal planning and cooking strategies, planning for success, and keeping a strict food journal. ? ?Theresa Mathews has agreed to follow-up with our clinic in 2 weeks. She was informed of the importance of frequent follow-up visits to maximize her success with intensive lifestyle modifications for her multiple health conditions.  ? ?Theresa Mathews was informed we would discuss her lab results at her next visit unless there is a critical issue that needs to be addressed sooner. Theresa Mathews agreed to keep her next visit at the agreed upon time to discuss these results. ? ?Objective:  ? ?Blood pressure 124/75, pulse 64, temperature 97.9 ?F (36.6 ?C), height '5\' 4"'$  (1.626 m), weight 217 lb (98.4 kg), SpO2 100 %. ?Body mass index is 37.25 kg/m?. ? ?General: Cooperative, alert, well developed, in no acute distress. ?HEENT: Conjunctivae and lids unremarkable. ?Cardiovascular: Regular rhythm.  ?Lungs: Normal work of breathing. ?Neurologic: No focal deficits.  ? ?Lab Results  ?Component Value Date  ? CREATININE 0.99 07/09/2021  ? BUN 21 07/09/2021  ? NA 142 07/09/2021  ? K 4.9 07/09/2021  ? CL 100 07/09/2021  ? CO2 22 07/09/2021  ? ?Lab Results  ?Component Value Date  ? ALT 15 07/09/2021  ? AST 16 07/09/2021  ? ALKPHOS 104 07/09/2021  ? BILITOT 0.5 07/09/2021  ? ?Lab Results  ?Component Value Date  ? HGBA1C 5.1 07/09/2021  ? HGBA1C 5.1 01/28/2021  ? HGBA1C 5.6 06/26/2020  ? HGBA1C 5.6 11/16/2019  ? HGBA1C 5.2 06/14/2019  ? ?Lab Results  ?Component Value Date  ? INSULIN 7.3  07/09/2021  ? INSULIN 7.7 01/28/2021  ? INSULIN 7.4 06/26/2020  ? INSULIN 10.1 11/16/2019  ? INSULIN 11.0 06/14/2019  ? ?Lab Results  ?Component Value Date  ? TSH 1.290 08/26/2017  ? ?Lab Results  ?Component Value Date  ? CHOL 226 (H) 07/09/2021  ? HDL 58 07/09/2021  ? LDLCALC 149 (H) 07/09/2021  ? TRIG 106 07/09/2021  ? CHOLHDL 3.9 07/09/2021  ? ?Lab Results  ?Component Value Date  ? VD25OH 87.1 07/09/2021  ? VD25OH 29.1 (L) 01/28/2021  ? VD25OH 34.8 06/26/2020  ? ?Lab Results  ?Component Value Date  ? WBC 8.5 07/12/2017  ? HGB 13.7 07/12/2017  ? HCT 39.8 07/12/2017  ? MCV 88.9 07/12/2017  ? PLT 383.0 07/12/2017  ? ?No results found for: IRON, TIBC, FERRITIN ? ?Attestation Statements:  ? ?Reviewed by clinician on day of visit: allergies, medications, problem list, medical history, surgical history, family history, social history, and previous encounter notes. ? ?I, Tonye Pearson, am acting as Location manager for [employer], PA-C. ? ?I have reviewed the above documentation for accuracy and completeness, and I agree with the above. Abby Potash, PA-C ? ?

## 2021-07-22 NOTE — Progress Notes (Addendum)
?TeleHealth Visit:  ?This visit was completed with telemedicine (audio/video) technology. ?Theresa Mathews has verbally consented to this TeleHealth visit. The patient is located at home, the provider is located at home. The participants in this visit include the listed provider and patient. The visit was conducted today via MyChart video. ? ?OBESITY ?Theresa Mathews is here to discuss her progress with her obesity treatment plan along with follow-up of her obesity related diagnoses.  ? ?Today's visit was # 21 ?Starting weight: 220 lbs ?Starting date: 08/26/2017 ?Weight at last in office visit: 217 lbs on 07/09/21 ?Total weight loss: 3 lbs at last in office visit on 07/09/21. ?Today's reported weight: 216 lbs  ? ?Nutrition Plan: keeping a food journal and adhering to recommended goals of 1200-1400 calories and 95 gms  protein.  ?Hunger is well controlled. Cravings are moderately controlled.  ?Current exercise: walking 3-4 days per week for 20-30 minutes.  She says she is doing a little bit of strength strength training. ? ?Interim History: Theresa Mathews is doing an excellent job with meal planning.  She is planning so well that she is able to enter food in her journal the night before for the next day.  Journaling is working well for her.  She is journaling consistently and meeting protein and calorie goals. ?She does get very frustrated with slow weight loss. ?Water intake is low at about 40 ounces per day. ?She reports that her compliance with all of her medications has been much better and she gets her daily medications in at least 6 days/week. ? ?Assessment/Plan:  ?We went over her recent lab work results in detail. ? ?1. Hyperlipidemia ?LDL is not at goal. ?Medication(s): None ?Patient denies myalgias.  ?Cardiovascular risk factors: dyslipidemia, hypertension, obesity (BMI >= 30 kg/m2), and sedentary lifestyle ? ?Lab Results  ?Component Value Date  ? CHOL 226 (H) 07/09/2021  ? HDL 58 07/09/2021  ? LDLCALC 149 (H) 07/09/2021  ?  TRIG 106 07/09/2021  ? CHOLHDL 3.9 07/09/2021  ? ?Lab Results  ?Component Value Date  ? ALT 15 07/09/2021  ? AST 16 07/09/2021  ? ALKPHOS 104 07/09/2021  ? BILITOT 0.5 07/09/2021  ? ?The 10-year ASCVD risk score (Arnett DK, et al., 2019) is: 2.7% ?  Values used to calculate the score: ?    Age: 56 years ?    Sex: Female ?    Is Non-Hispanic African American: No ?    Diabetic: No ?    Tobacco smoker: No ?    Systolic Blood Pressure: 759 mmHg ?    Is BP treated: Yes ?    HDL Cholesterol: 58 mg/dL ?    Total Cholesterol: 226 mg/dL ? ?Plan: ?No statin indicated.  Continue with weight loss and exercise. ? ?2. Insulin Resistance ?Theresa Mathews has had elevated fasting insulin readings. Goal is HgbA1c < 5.7, fasting insulin closer to 5.   ?She  denies polyphagia. ?Medication(s): Mounjaro 12.5 mg weekly.  Appetite well controlled.   ?Denies nausea.  Reports some constipation which she is able to manage effectively with MiraLAX. ?Lab Results  ?Component Value Date  ? HGBA1C 5.1 07/09/2021  ? ?Lab Results  ?Component Value Date  ? INSULIN 7.3 07/09/2021  ? INSULIN 7.7 01/28/2021  ? INSULIN 7.4 06/26/2020  ? INSULIN 10.1 11/16/2019  ? INSULIN 11.0 06/14/2019  ? ? ?Plan ?Continue Mounjaro at 12.5 mg weekly. ? ?3. Vitamin D Deficiency ?Vitamin D is at goal of 50. She is on twice weekly prescription Vitamin D 50,000 IU.  ?Lab  Results  ?Component Value Date  ? VD25OH 87.1 07/09/2021  ? VD25OH 29.1 (L) 01/28/2021  ? VD25OH 34.8 06/26/2020  ? ? ?Plan: ?Reduce frequency of prescription vitamin D 50,000 IU to weekly. ? ? ?4.  Other depression with emotional eating ?Theresa Mathews is being much more compliant with taking her Celexa 20 mg and bupropion 300 XL daily.  She notices that her mood is better and much more stable.  This is despite the fact that her husband's multiple sclerosis has worsened recently. ? ?Plan: ?Continue bupropion and Celexa at current dosage. ? ?5. Obesity: Current BMI 37.23 ?Theresa Mathews is currently in the action stage of change.  As such, her goal is to continue with weight loss efforts.  ?She has agreed to  keeping a food journal and adhering to recommended goals of 1200-1400 calories and 95 gms  protein.  ? ?Exercise goals: Continue walking regimen and try to add in more strength training. ? ?Behavioral modification strategies: increasing water intake, planning for success, and keeping a strict food journal. ? ?Theresa Mathews has agreed to follow-up with our clinic in 3 weeks.  ? ?No orders of the defined types were placed in this encounter. ? ? ?There are no discontinued medications.  ? ?No orders of the defined types were placed in this encounter. ?   ? ?Objective:  ? ?VITALS: Per patient if applicable, see vitals. ?GENERAL: Alert and in no acute distress. ?CARDIOPULMONARY: No increased WOB. Speaking in clear sentences.  ?PSYCH: Pleasant and cooperative. Speech normal rate and rhythm. Affect is appropriate. Insight and judgement are appropriate. Attention is focused, linear, and appropriate.  ?NEURO: Oriented as arrived to appointment on time with no prompting.  ? ?Lab Results  ?Component Value Date  ? CREATININE 0.99 07/09/2021  ? BUN 21 07/09/2021  ? NA 142 07/09/2021  ? K 4.9 07/09/2021  ? CL 100 07/09/2021  ? CO2 22 07/09/2021  ? ?Lab Results  ?Component Value Date  ? ALT 15 07/09/2021  ? AST 16 07/09/2021  ? ALKPHOS 104 07/09/2021  ? BILITOT 0.5 07/09/2021  ? ?Lab Results  ?Component Value Date  ? HGBA1C 5.1 07/09/2021  ? HGBA1C 5.1 01/28/2021  ? HGBA1C 5.6 06/26/2020  ? HGBA1C 5.6 11/16/2019  ? HGBA1C 5.2 06/14/2019  ? ?Lab Results  ?Component Value Date  ? INSULIN 7.3 07/09/2021  ? INSULIN 7.7 01/28/2021  ? INSULIN 7.4 06/26/2020  ? INSULIN 10.1 11/16/2019  ? INSULIN 11.0 06/14/2019  ? ?Lab Results  ?Component Value Date  ? TSH 1.290 08/26/2017  ? ?Lab Results  ?Component Value Date  ? CHOL 226 (H) 07/09/2021  ? HDL 58 07/09/2021  ? LDLCALC 149 (H) 07/09/2021  ? TRIG 106 07/09/2021  ? CHOLHDL 3.9 07/09/2021  ? ?Lab Results  ?Component  Value Date  ? WBC 8.5 07/12/2017  ? HGB 13.7 07/12/2017  ? HCT 39.8 07/12/2017  ? MCV 88.9 07/12/2017  ? PLT 383.0 07/12/2017  ? ?No results found for: IRON, TIBC, FERRITIN ?Lab Results  ?Component Value Date  ? VD25OH 87.1 07/09/2021  ? VD25OH 29.1 (L) 01/28/2021  ? VD25OH 34.8 06/26/2020  ? ? ?Attestation Statements:  ? ?Reviewed by clinician on day of visit: allergies, medications, problem list, medical history, surgical history, family history, social history, and previous encounter notes. ? ?Time spent on visit including pre-visit chart review and post-visit charting and care was 35 minutes.  ? ? ?

## 2021-07-23 ENCOUNTER — Encounter (INDEPENDENT_AMBULATORY_CARE_PROVIDER_SITE_OTHER): Payer: Self-pay | Admitting: Family Medicine

## 2021-07-23 ENCOUNTER — Telehealth (INDEPENDENT_AMBULATORY_CARE_PROVIDER_SITE_OTHER): Payer: No Typology Code available for payment source | Admitting: Family Medicine

## 2021-07-23 DIAGNOSIS — Z6837 Body mass index (BMI) 37.0-37.9, adult: Secondary | ICD-10-CM

## 2021-07-23 DIAGNOSIS — E559 Vitamin D deficiency, unspecified: Secondary | ICD-10-CM | POA: Diagnosis not present

## 2021-07-23 DIAGNOSIS — E7849 Other hyperlipidemia: Secondary | ICD-10-CM

## 2021-07-23 DIAGNOSIS — E88819 Insulin resistance, unspecified: Secondary | ICD-10-CM

## 2021-07-23 DIAGNOSIS — E669 Obesity, unspecified: Secondary | ICD-10-CM

## 2021-07-23 DIAGNOSIS — F3289 Other specified depressive episodes: Secondary | ICD-10-CM | POA: Diagnosis not present

## 2021-07-23 DIAGNOSIS — E8881 Metabolic syndrome: Secondary | ICD-10-CM | POA: Diagnosis not present

## 2021-07-30 ENCOUNTER — Telehealth (INDEPENDENT_AMBULATORY_CARE_PROVIDER_SITE_OTHER): Payer: Self-pay | Admitting: Physician Assistant

## 2021-07-30 NOTE — Telephone Encounter (Signed)
Rep at [employer] called to speak with Prior Authorization representative for T. Aguilar PA-C.   Please follow up. REF KEY: [id number].  [employer] phone: [phone]

## 2021-07-30 NOTE — Telephone Encounter (Signed)
No further action needed. Request was cancelled, due to [employer] not handling prior authorization.  ?

## 2021-08-11 ENCOUNTER — Ambulatory Visit (INDEPENDENT_AMBULATORY_CARE_PROVIDER_SITE_OTHER): Payer: No Typology Code available for payment source | Admitting: Physician Assistant

## 2021-08-18 ENCOUNTER — Ambulatory Visit (INDEPENDENT_AMBULATORY_CARE_PROVIDER_SITE_OTHER): Payer: No Typology Code available for payment source | Admitting: Nurse Practitioner

## 2021-08-18 ENCOUNTER — Encounter (INDEPENDENT_AMBULATORY_CARE_PROVIDER_SITE_OTHER): Payer: Self-pay | Admitting: Nurse Practitioner

## 2021-08-18 VITALS — BP 125/78 | HR 70 | Temp 97.9°F | Ht 64.0 in | Wt 214.0 lb

## 2021-08-18 DIAGNOSIS — Z6836 Body mass index (BMI) 36.0-36.9, adult: Secondary | ICD-10-CM | POA: Diagnosis not present

## 2021-08-18 DIAGNOSIS — I10 Essential (primary) hypertension: Secondary | ICD-10-CM | POA: Diagnosis not present

## 2021-08-18 DIAGNOSIS — E8881 Metabolic syndrome: Secondary | ICD-10-CM | POA: Diagnosis not present

## 2021-08-18 DIAGNOSIS — Z7985 Long-term (current) use of injectable non-insulin antidiabetic drugs: Secondary | ICD-10-CM

## 2021-08-18 DIAGNOSIS — E669 Obesity, unspecified: Secondary | ICD-10-CM | POA: Diagnosis not present

## 2021-08-18 DIAGNOSIS — E66812 Obesity, class 2: Secondary | ICD-10-CM

## 2021-08-18 DIAGNOSIS — E88819 Insulin resistance, unspecified: Secondary | ICD-10-CM

## 2021-08-18 MED ORDER — TELMISARTAN 80 MG PO TABS: 80.0000 mg | ORAL_TABLET | Freq: Every day | ORAL | 0 refills | Status: DC

## 2021-08-18 MED ORDER — TIRZEPATIDE 12.5 MG/0.5ML ~~LOC~~ SOAJ: 12.5000 mg | SUBCUTANEOUS | 0 refills | Status: DC

## 2021-08-21 ENCOUNTER — Telehealth (INDEPENDENT_AMBULATORY_CARE_PROVIDER_SITE_OTHER): Payer: Self-pay | Admitting: Nurse Practitioner

## 2021-08-21 ENCOUNTER — Encounter (INDEPENDENT_AMBULATORY_CARE_PROVIDER_SITE_OTHER): Payer: Self-pay

## 2021-08-21 NOTE — Telephone Encounter (Signed)
Theresa Mathews - Prior authorization denied for [employer]. Per insurance: Patient does not have type 2 diabetes. Patient already uses copay card. Patient sent denial message via mychart.

## 2021-08-26 NOTE — Progress Notes (Signed)
Chief Complaint:   OBESITY Theresa Mathews is here to discuss her progress with her obesity treatment plan along with follow-up of her obesity related diagnoses. Theresa Mathews is on keeping a food journal and adhering to recommended goals of 1200-1400 calories and 95 grams of protein and states she is following her eating plan approximately 90% of the time. Theresa Mathews states she is walking for 20-40 minutes 3-4 times per week.  Today's visit was #: 25 Starting weight: 220 lbs Starting date: 08/26/2017 Today's weight: 214 lbs Today's date:08/18/2021 Total lbs lost to date: 6 lbs Total lbs lost since last in-office visit: 3 lbs  Interim History: Theresa Mathews states journaling is working well at this time. She meal plans for the week. She denies hunger or cravings. She is meeting protein and calories goals. She is drinking water daily but not enough.   Subjective:   1. Essential hypertension Theresa Mathews is currently taking Micardis 80 mg 4-5 days per week. She denies side effects chest pain, shortness of breath, and palpitations.   2. Insulin resistance Theresa Mathews is taking Mounjaro 12.5 mg. She denies side effects. She denies hunger or cravings.   Assessment/Plan:   1. Essential hypertension Theresa Mathews is working on healthy weight loss and exercise to improve blood pressure control. We will refill Micardis 80 mg for 3 month with no refills. We will watch for signs of hypotension as she continues her lifestyle modifications.  - telmisartan (MICARDIS) 80 MG tablet; Take 1 tablet (80 mg total) by mouth daily.  Dispense: 90 tablet; Refill: 0  2. Insulin resistance Theresa Mathews will continue to work on weight loss, exercise, and decreasing simple carbohydrates to help decrease the risk of diabetes. We will refill Mounjaro 12.5 mg for 1 month with no refills. We discussed side effects. Theresa Mathews agreed to follow-up with [name] as directed to closely monitor her progress.  - tirzepatide (MOUNJARO) 12.5 MG/0.5ML Pen; Inject 12.5 mg into  the skin once a week.  Dispense: 2 mL; Refill: 0  3. Obesity: BMI 36.7 Theresa Mathews is currently in the action stage of change. As such, her goal is to continue with weight loss efforts. She has agreed to keeping a food journal and adhering to recommended goals of 1200-1300 calories and 95 plus grams of protein.   Exercise goals:  As is.   Behavioral modification strategies: increasing vegetables, increasing water intake, and planning for success.  Theresa Mathews has agreed to follow-up with our clinic in 3 weeks. She was informed of the importance of frequent follow-up visits to maximize her success with intensive lifestyle modifications for her multiple health conditions.   Objective:   Blood pressure 125/78, pulse 70, temperature 97.9 F (36.6 C), height '5\' 4"'$  (1.626 m), weight 214 lb (97.1 kg), SpO2 98 %. Body mass index is 36.73 kg/m.  General: Cooperative, alert, well developed, in no acute distress. HEENT: Conjunctivae and lids unremarkable. Cardiovascular: Regular rhythm.  Lungs: Normal work of breathing. Neurologic: No focal deficits.   Lab Results  Component Value Date   CREATININE 0.99 07/09/2021   BUN 21 07/09/2021   NA 142 07/09/2021   K 4.9 07/09/2021   CL 100 07/09/2021   CO2 22 07/09/2021   Lab Results  Component Value Date   ALT 15 07/09/2021   AST 16 07/09/2021   ALKPHOS 104 07/09/2021   BILITOT 0.5 07/09/2021   Lab Results  Component Value Date   HGBA1C 5.1 07/09/2021   HGBA1C 5.1 01/28/2021   HGBA1C 5.6 06/26/2020   HGBA1C 5.6 11/16/2019  HGBA1C 5.2 06/14/2019   Lab Results  Component Value Date   INSULIN 7.3 07/09/2021   INSULIN 7.7 01/28/2021   INSULIN 7.4 06/26/2020   INSULIN 10.1 11/16/2019   INSULIN 11.0 06/14/2019   Lab Results  Component Value Date   TSH 1.290 08/26/2017   Lab Results  Component Value Date   CHOL 226 (H) 07/09/2021   HDL 58 07/09/2021   LDLCALC 149 (H) 07/09/2021   TRIG 106 07/09/2021   CHOLHDL 3.9 07/09/2021   Lab  Results  Component Value Date   VD25OH 87.1 07/09/2021   VD25OH 29.1 (L) 01/28/2021   VD25OH 34.8 06/26/2020   Lab Results  Component Value Date   WBC 8.5 07/12/2017   HGB 13.7 07/12/2017   HCT 39.8 07/12/2017   MCV 88.9 07/12/2017   PLT 383.0 07/12/2017   No results found for: IRON, TIBC, FERRITIN  Attestation Statements:   Reviewed by clinician on day of visit: allergies, medications, problem list, medical history, surgical history, family history, social history, and previous encounter notes.  I, Lizbeth Bark, RMA, am acting as Location manager for Everardo Pacific, FNP.  I have reviewed the above documentation for accuracy and completeness, and I agree with the above. Everardo Pacific, FNP

## 2021-08-28 NOTE — Progress Notes (Signed)
TeleHealth Visit:  This visit was completed with telemedicine (audio/video) technology. Theresa Mathews has verbally consented to this TeleHealth visit. The patient is located at home, the provider is located at home. The participants in this visit include the listed provider and patient. The visit was conducted today via MyChart video.  OBESITY Theresa Mathews is here to discuss her progress with her obesity treatment plan along with follow-up of her obesity related diagnoses.   Today's visit was # 30 Starting weight: 220 lbs Starting date: 08/26/2017 Weight at last in office visit: 214 lbs on 08/18/21 Total weight loss: 6 lbs at last in office visit on 08/18/21. Today's reported weight: 214 lbs   Nutrition Plan: keeping a food journal and adhering to recommended goals of 1200-1300 calories and 95 gms protein.  Hunger is well controlled. Cravings are well controlled.  Current exercise: walking 30 minutes 4 days per week.  She is trying to fit in some strength training with bands, squats, hand weights.  Interim History: Theresa Mathews has done well over the past 10 months.  She has lost 18 pounds since August 2022 when we started the [name].  Her coupon is about to run out. She is doing a good job with exercise and meal planning.  She is journaling 6 out of 7 days/week.  She usually gets over 100 g of protein per day and keep calories between 1200 and 1400.  She uses [employer]. 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  However he is staying with his parents for 1 month so she is getting a bit of respite.  Assessment/Plan:  1. Insulin Resistance Well-controlled.  Fasting insulin nearly at goal (less than 5). She denies polyphagia. Medication(s): Mounjaro 12.5 mg weekly.  This has been on backorder-she took her last shot last week.  She has 1 refill left on her coupon. Lab Results  Component Value Date   HGBA1C 5.1 07/09/2021   Lab Results  Component Value Date    INSULIN 7.3 07/09/2021   INSULIN 7.7 01/28/2021   INSULIN 7.4 06/26/2020   INSULIN 10.1 11/16/2019   INSULIN 11.0 06/14/2019    Plan Continue Mounjaro 12.5 mg if available.   2. Eating disorder/emotional eating Theresa Mathews has had issues with stress/emotional eating. Currently this is well controlled. Overall mood is stable. Denies suicidal/homicidal ideation. Medication(s): Bupropion XL 300 mg daily and citalopram 20 mg daily-reports good compliance.  Plan: Continue bupropion and citalopram at current doses.   3. Hypertension Hypertension well controlled.  Medication(s): Telmisartan 80 mg daily-reports good compliance.  Has history of noncompliance.  BP Readings from Last 3 Encounters:  08/18/21 125/78  07/09/21 124/75  06/11/21 123/80   Lab Results  Component Value Date   CREATININE 0.99 07/09/2021   CREATININE 0.95 01/28/2021   CREATININE 0.83 06/26/2020    Plan: Current treatment plan is effective, no change in therapy..   4. Obesity: Current BMI 36.7 Theresa Mathews is currently in the action stage of change. As such, her goal is to continue with weight loss efforts.  She has agreed to keeping a food journal and adhering to recommended goals of 1200-1400 calories and 95 gms protein.   Discussed options once she is unable to obtain [facility].  Her insurance does not cover GLP-1's since she is not diabetic.  Also does not cover antiobesity medications. Possibly consider Qsymia or phentermine plus topiramate. We will schedule next appointment with Dr. Owens Shark to discuss options.  Exercise goals: as is.  Behavioral  modification strategies: increasing lean protein intake, decreasing simple carbohydrates, decreasing eating out, and planning for success.  Theresa Mathews has agreed to follow-up with our clinic in 3 weeks.   No orders of the defined types were placed in this encounter.   There are no discontinued medications.   No orders of the defined types were placed in this  encounter.     Objective:   VITALS: Per patient if applicable, see vitals. GENERAL: Alert and in no acute distress. CARDIOPULMONARY: No increased WOB. Speaking in clear sentences.  PSYCH: Pleasant and cooperative. Speech normal rate and rhythm. Affect is appropriate. Insight and judgement are appropriate. Attention is focused, linear, and appropriate.  NEURO: Oriented as arrived to appointment on time with no prompting.   Lab Results  Component Value Date   CREATININE 0.99 07/09/2021   BUN 21 07/09/2021   NA 142 07/09/2021   K 4.9 07/09/2021   CL 100 07/09/2021   CO2 22 07/09/2021   Lab Results  Component Value Date   ALT 15 07/09/2021   AST 16 07/09/2021   ALKPHOS 104 07/09/2021   BILITOT 0.5 07/09/2021   Lab Results  Component Value Date   HGBA1C 5.1 07/09/2021   HGBA1C 5.1 01/28/2021   HGBA1C 5.6 06/26/2020   HGBA1C 5.6 11/16/2019   HGBA1C 5.2 06/14/2019   Lab Results  Component Value Date   INSULIN 7.3 07/09/2021   INSULIN 7.7 01/28/2021   INSULIN 7.4 06/26/2020   INSULIN 10.1 11/16/2019   INSULIN 11.0 06/14/2019   Lab Results  Component Value Date   TSH 1.290 08/26/2017   Lab Results  Component Value Date   CHOL 226 (H) 07/09/2021   HDL 58 07/09/2021   LDLCALC 149 (H) 07/09/2021   TRIG 106 07/09/2021   CHOLHDL 3.9 07/09/2021   Lab Results  Component Value Date   WBC 8.5 07/12/2017   HGB 13.7 07/12/2017   HCT 39.8 07/12/2017   MCV 88.9 07/12/2017   PLT 383.0 07/12/2017   No results found for: IRON, TIBC, FERRITIN Lab Results  Component Value Date   VD25OH 87.1 07/09/2021   VD25OH 29.1 (L) 01/28/2021   VD25OH 34.8 06/26/2020    Attestation Statements:   Reviewed by clinician on day of visit: allergies, medications, problem list, medical history, surgical history, family history, social history, and previous encounter notes.  Time spent on visit including pre-visit chart review and post-visit charting and care was 35 minutes.  This time  included: -preparing to see the patient (e.g., review of tests) -obtaining and/or reviewing separately obtained history -counseling and educating the patient/family/caregiver -referring and communicating with other health care professionals (when not separately reported)- message to Dr. Owens Shark. -documenting clinical information in the electronic or other health record

## 2021-09-01 ENCOUNTER — Encounter (INDEPENDENT_AMBULATORY_CARE_PROVIDER_SITE_OTHER): Payer: Self-pay | Admitting: Family Medicine

## 2021-09-01 ENCOUNTER — Telehealth (INDEPENDENT_AMBULATORY_CARE_PROVIDER_SITE_OTHER): Payer: No Typology Code available for payment source | Admitting: Family Medicine

## 2021-09-01 DIAGNOSIS — I10 Essential (primary) hypertension: Secondary | ICD-10-CM | POA: Diagnosis not present

## 2021-09-01 DIAGNOSIS — F3289 Other specified depressive episodes: Secondary | ICD-10-CM

## 2021-09-01 DIAGNOSIS — E66812 Obesity, class 2: Secondary | ICD-10-CM

## 2021-09-01 DIAGNOSIS — E669 Obesity, unspecified: Secondary | ICD-10-CM | POA: Diagnosis not present

## 2021-09-01 DIAGNOSIS — E8881 Metabolic syndrome: Secondary | ICD-10-CM

## 2021-09-01 DIAGNOSIS — Z6836 Body mass index (BMI) 36.0-36.9, adult: Secondary | ICD-10-CM

## 2021-09-01 DIAGNOSIS — E88819 Insulin resistance, unspecified: Secondary | ICD-10-CM

## 2021-09-22 ENCOUNTER — Ambulatory Visit (INDEPENDENT_AMBULATORY_CARE_PROVIDER_SITE_OTHER): Payer: No Typology Code available for payment source | Admitting: Bariatrics

## 2021-09-22 ENCOUNTER — Encounter (INDEPENDENT_AMBULATORY_CARE_PROVIDER_SITE_OTHER): Payer: Self-pay | Admitting: Bariatrics

## 2021-09-22 VITALS — BP 131/82 | HR 63 | Temp 97.3°F | Ht 64.0 in | Wt 219.0 lb

## 2021-09-22 DIAGNOSIS — E7849 Other hyperlipidemia: Secondary | ICD-10-CM

## 2021-09-22 DIAGNOSIS — R632 Polyphagia: Secondary | ICD-10-CM

## 2021-09-22 DIAGNOSIS — E669 Obesity, unspecified: Secondary | ICD-10-CM | POA: Diagnosis not present

## 2021-09-22 DIAGNOSIS — Z7985 Long-term (current) use of injectable non-insulin antidiabetic drugs: Secondary | ICD-10-CM

## 2021-09-22 DIAGNOSIS — E8881 Metabolic syndrome: Secondary | ICD-10-CM

## 2021-09-22 DIAGNOSIS — Z6837 Body mass index (BMI) 37.0-37.9, adult: Secondary | ICD-10-CM

## 2021-09-22 MED ORDER — TIRZEPATIDE 12.5 MG/0.5ML ~~LOC~~ SOAJ: 12.5000 mg | SUBCUTANEOUS | 0 refills | Status: DC

## 2021-09-23 NOTE — Progress Notes (Signed)
Chief Complaint:   OBESITY Theresa Mathews is here to discuss her progress with her obesity treatment plan along with follow-up of her obesity related diagnoses. Theresa Mathews is on keeping a food journal and adhering to recommended goals of 1200-1400 calories and 95 grams of protein daily and states she is following her eating plan approximately 70% of the time. Theresa Mathews states she is walking for 30 minutes 3-4 times per week.  Today's visit was #: 81 Starting weight: 220 lbs Starting date: 07/30/2017 Today's weight: 219 lbs Today's date: 09/22/2021 Total lbs lost to date: 1 Total lbs lost since last in-office visit: 0  Interim History: Theresa Mathews is up 5 pounds from her last visit.  She has been on vacation and she is up about 3 pounds in water weight.  Subjective:   1. Insulin resistance Theresa Mathews is taking Mounjaro and she notes decreased food intake.  2. Polyphagia Theresa Mathews is taking Mounjaro.  3. Other hyperlipidemia Theresa Mathews is not on medications currently.  Assessment/Plan:   1. Insulin resistance Theresa Mathews will continue Mounjaro 12.5 mg once weekly, and we will refill for 1 month.  - tirzepatide (MOUNJARO) 12.5 MG/0.5ML Pen; Inject 12.5 mg into the skin once a week.  Dispense: 2 mL; Refill: 0  2. Polyphagia Theresa Mathews will continue Mounjaro 12.5 mg once weekly, and we will refill for 1 month.  - tirzepatide (MOUNJARO) 12.5 MG/0.5ML Pen; Inject 12.5 mg into the skin once a week.  Dispense: 2 mL; Refill: 0  3. Other hyperlipidemia Theresa Mathews will illuminate trans fats, and decrease saturated fats except for dairy, unprocessed meat, and dark chocolate.  4. Obesity: BMI 37.7 Theresa Mathews is currently in the action stage of change. As such, her goal is to continue with weight loss efforts. She has agreed to keeping a food journal and adhering to recommended goals of 1200-1400 calories and 95 grams of protein daily.   Theresa Mathews will continue to journal.  She will adhere closely to the plan 80-90%.  Exercise  goals: As is.   Behavioral modification strategies: increasing lean protein intake, decreasing simple carbohydrates, increasing vegetables, increasing water intake, decreasing eating out, no skipping meals, meal planning and cooking strategies, keeping healthy foods in the home, and planning for success.  Theresa Mathews has agreed to follow-up with our clinic in 3 to 4 weeks via virtual with [employer], FNP-C. She was informed of the importance of frequent follow-up visits to maximize her success with intensive lifestyle modifications for her multiple health conditions.   Objective:   Blood pressure 131/82, pulse 63, temperature (!) 97.3 F (36.3 C), height '5\' 4"'$  (1.626 m), weight 219 lb (99.3 kg), SpO2 95 %. Body mass index is 37.59 kg/m.  General: Cooperative, alert, well developed, in no acute distress. HEENT: Conjunctivae and lids unremarkable. Cardiovascular: Regular rhythm.  Lungs: Normal work of breathing. Neurologic: No focal deficits.   Lab Results  Component Value Date   CREATININE 0.99 07/09/2021   BUN 21 07/09/2021   NA 142 07/09/2021   K 4.9 07/09/2021   CL 100 07/09/2021   CO2 22 07/09/2021   Lab Results  Component Value Date   ALT 15 07/09/2021   AST 16 07/09/2021   ALKPHOS 104 07/09/2021   BILITOT 0.5 07/09/2021   Lab Results  Component Value Date   HGBA1C 5.1 07/09/2021   HGBA1C 5.1 01/28/2021   HGBA1C 5.6 06/26/2020   HGBA1C 5.6 11/16/2019   HGBA1C 5.2 06/14/2019   Lab Results  Component Value Date   INSULIN 7.3 07/09/2021  INSULIN 7.7 01/28/2021   INSULIN 7.4 06/26/2020   INSULIN 10.1 11/16/2019   INSULIN 11.0 06/14/2019   Lab Results  Component Value Date   TSH 1.290 08/26/2017   Lab Results  Component Value Date   CHOL 226 (H) 07/09/2021   HDL 58 07/09/2021   LDLCALC 149 (H) 07/09/2021   TRIG 106 07/09/2021   CHOLHDL 3.9 07/09/2021   Lab Results  Component Value Date   VD25OH 87.1 07/09/2021   VD25OH 29.1 (L) 01/28/2021   VD25OH 34.8  06/26/2020   Lab Results  Component Value Date   WBC 8.5 07/12/2017   HGB 13.7 07/12/2017   HCT 39.8 07/12/2017   MCV 88.9 07/12/2017   PLT 383.0 07/12/2017   No results found for: "IRON", "TIBC", "FERRITIN"  Attestation Statements:   Reviewed by clinician on day of visit: allergies, medications, problem list, medical history, surgical history, family history, social history, and previous encounter notes.   Wilhemena Durie, am acting as Location manager for [employer], DO.  I have reviewed the above documentation for accuracy and completeness, and I agree with the above. Jearld Lesch, DO

## 2021-09-24 ENCOUNTER — Encounter (INDEPENDENT_AMBULATORY_CARE_PROVIDER_SITE_OTHER): Payer: Self-pay | Admitting: Bariatrics

## 2021-09-29 ENCOUNTER — Encounter (INDEPENDENT_AMBULATORY_CARE_PROVIDER_SITE_OTHER): Payer: Self-pay

## 2021-09-29 ENCOUNTER — Telehealth (INDEPENDENT_AMBULATORY_CARE_PROVIDER_SITE_OTHER): Payer: Self-pay | Admitting: Bariatrics

## 2021-09-29 NOTE — Telephone Encounter (Signed)
Dr. Owens Shark - Prior authorization denied for [facility] Patient already uses copay card. Patient sent denial message via mychart.

## 2021-10-23 ENCOUNTER — Telehealth (INDEPENDENT_AMBULATORY_CARE_PROVIDER_SITE_OTHER): Payer: Self-pay | Admitting: Bariatrics

## 2021-10-23 ENCOUNTER — Encounter (INDEPENDENT_AMBULATORY_CARE_PROVIDER_SITE_OTHER): Payer: Self-pay

## 2021-10-23 NOTE — Telephone Encounter (Signed)
Dr. Owens Shark - Prior authorization denied for [facility]. Per insurance: Patient does not have type 2 diabetes. Patient already uses copay card. Patient sent denial message via mychart.

## 2021-10-27 ENCOUNTER — Ambulatory Visit (INDEPENDENT_AMBULATORY_CARE_PROVIDER_SITE_OTHER): Payer: No Typology Code available for payment source | Admitting: Bariatrics

## 2021-10-27 ENCOUNTER — Encounter (INDEPENDENT_AMBULATORY_CARE_PROVIDER_SITE_OTHER): Payer: Self-pay | Admitting: Bariatrics

## 2021-10-27 VITALS — BP 120/76 | HR 69 | Temp 97.9°F | Ht 64.0 in | Wt 212.0 lb

## 2021-10-27 DIAGNOSIS — E8881 Metabolic syndrome: Secondary | ICD-10-CM

## 2021-10-27 DIAGNOSIS — Z6836 Body mass index (BMI) 36.0-36.9, adult: Secondary | ICD-10-CM

## 2021-10-27 DIAGNOSIS — I10 Essential (primary) hypertension: Secondary | ICD-10-CM

## 2021-10-27 DIAGNOSIS — E669 Obesity, unspecified: Secondary | ICD-10-CM | POA: Diagnosis not present

## 2021-10-27 DIAGNOSIS — R632 Polyphagia: Secondary | ICD-10-CM

## 2021-11-05 ENCOUNTER — Encounter (INDEPENDENT_AMBULATORY_CARE_PROVIDER_SITE_OTHER): Payer: Self-pay

## 2021-11-05 MED ORDER — TOPIRAMATE 25 MG PO TABS: 25.0000 mg | ORAL_TABLET | Freq: Two times a day (BID) | ORAL | 0 refills | Status: DC

## 2021-11-05 MED ORDER — PHENTERMINE HCL 15 MG PO CAPS: 15.0000 mg | ORAL_CAPSULE | Freq: Every day | ORAL | 0 refills | Status: DC

## 2021-11-05 NOTE — Progress Notes (Signed)
Chief Complaint:   OBESITY Theresa Mathews is here to discuss her progress with her obesity treatment plan along with follow-up of her obesity related diagnoses. Theresa Mathews is on keeping a food journal and adhering to recommended goals of 1200-1400 calories and 95 grams of protein daily and states she is following her eating plan approximately 80% of the time. Theresa Mathews states she is doing 0 minutes 0 times per week.  Today's visit was #: 32 Starting weight: 220 lbs Starting date: 07/30/2017 Today's weight: 212 lbs Today's date: 10/27/2021 Total lbs lost to date: 8 Total lbs lost since last in-office visit: 2  Interim History: Theresa Mathews is down 2 lbs since her last visit. She struggles with her water intake.   Subjective:   1. Insulin resistance Theresa Mathews has taken [employer].   2. Polyphagia Theresa Mathews has taken [facility].   3. Essential hypertension Theresa Mathews is taking Micardis, and her blood pressure is controlled.   Assessment/Plan:   1. Insulin resistance Theresa Mathews will keep all carbohydrates low (starches and sweets).   2. Polyphagia Theresa Mathews is to increase her protein and water, and increase healthy fats.   3. Essential hypertension Theresa Mathews will continue her medications as directed.   4. Obesity: BMI 36.5 Theresa Mathews is currently in the action stage of change. As such, her goal is to continue with weight loss efforts. She has agreed to keeping a food journal and adhering to recommended goals of 1200-1400 calories and 95 grams of protein daily.   Meal planning and intentional eating were discussed. We will recheck fasting labs at her next visit.  We discussed various medication options to help Theresa Mathews with her weight loss efforts and we both agreed to start phentermine 15 mg once daily with lunch with no refills; and start Topamax 25 mg BID, with no refills.   Exercise goals: No exercise has been prescribed at this time.  Behavioral modification strategies: increasing lean protein intake, decreasing  simple carbohydrates, increasing vegetables, increasing water intake, decreasing eating out, no skipping meals, meal planning and cooking strategies, keeping healthy foods in the home, and planning for success.  Theresa Mathews has agreed to follow-up with our clinic in 4 weeks. She was informed of the importance of frequent follow-up visits to maximize her success with intensive lifestyle modifications for her multiple health conditions.   Objective:   Blood pressure 120/76, pulse 69, temperature 97.9 F (36.6 C), height '5\' 4"'$  (1.626 m), weight 212 lb (96.2 kg), SpO2 96 %. Body mass index is 36.39 kg/m.  General: Cooperative, alert, well developed, in no acute distress. HEENT: Conjunctivae and lids unremarkable. Cardiovascular: Regular rhythm.  Lungs: Normal work of breathing. Neurologic: No focal deficits.   Lab Results  Component Value Date   CREATININE 0.99 07/09/2021   BUN 21 07/09/2021   NA 142 07/09/2021   K 4.9 07/09/2021   CL 100 07/09/2021   CO2 22 07/09/2021   Lab Results  Component Value Date   ALT 15 07/09/2021   AST 16 07/09/2021   ALKPHOS 104 07/09/2021   BILITOT 0.5 07/09/2021   Lab Results  Component Value Date   HGBA1C 5.1 07/09/2021   HGBA1C 5.1 01/28/2021   HGBA1C 5.6 06/26/2020   HGBA1C 5.6 11/16/2019   HGBA1C 5.2 06/14/2019   Lab Results  Component Value Date   INSULIN 7.3 07/09/2021   INSULIN 7.7 01/28/2021   INSULIN 7.4 06/26/2020   INSULIN 10.1 11/16/2019   INSULIN 11.0 06/14/2019   Lab Results  Component Value Date   TSH  1.290 08/26/2017   Lab Results  Component Value Date   CHOL 226 (H) 07/09/2021   HDL 58 07/09/2021   LDLCALC 149 (H) 07/09/2021   TRIG 106 07/09/2021   CHOLHDL 3.9 07/09/2021   Lab Results  Component Value Date   VD25OH 87.1 07/09/2021   VD25OH 29.1 (L) 01/28/2021   VD25OH 34.8 06/26/2020   Lab Results  Component Value Date   WBC 8.5 07/12/2017   HGB 13.7 07/12/2017   HCT 39.8 07/12/2017   MCV 88.9 07/12/2017    PLT 383.0 07/12/2017   No results found for: "IRON", "TIBC", "FERRITIN"  Attestation Statements:   Reviewed by clinician on day of visit: allergies, medications, problem list, medical history, surgical history, family history, social history, and previous encounter notes.   Wilhemena Durie, am acting as Location manager for [employer], DO.  I have reviewed the above documentation for accuracy and completeness, and I agree with the above. Jearld Lesch, DO

## 2021-11-24 ENCOUNTER — Ambulatory Visit (INDEPENDENT_AMBULATORY_CARE_PROVIDER_SITE_OTHER): Payer: No Typology Code available for payment source | Admitting: Bariatrics

## 2021-11-24 ENCOUNTER — Encounter (INDEPENDENT_AMBULATORY_CARE_PROVIDER_SITE_OTHER): Payer: Self-pay | Admitting: Bariatrics

## 2021-11-24 VITALS — BP 111/73 | HR 60 | Temp 97.3°F | Ht 64.0 in | Wt 213.0 lb

## 2021-11-24 DIAGNOSIS — R632 Polyphagia: Secondary | ICD-10-CM

## 2021-11-24 DIAGNOSIS — Z6836 Body mass index (BMI) 36.0-36.9, adult: Secondary | ICD-10-CM

## 2021-11-24 DIAGNOSIS — E669 Obesity, unspecified: Secondary | ICD-10-CM

## 2021-11-24 DIAGNOSIS — E7849 Other hyperlipidemia: Secondary | ICD-10-CM | POA: Diagnosis not present

## 2021-11-24 MED ORDER — PHENTERMINE HCL 15 MG PO CAPS: 15.0000 mg | ORAL_CAPSULE | Freq: Every day | ORAL | 0 refills | Status: DC

## 2021-11-24 MED ORDER — TOPIRAMATE 25 MG PO TABS: 25.0000 mg | ORAL_TABLET | Freq: Two times a day (BID) | ORAL | 0 refills | Status: DC

## 2021-12-02 NOTE — Progress Notes (Unsigned)
Chief Complaint:   OBESITY Theresa Mathews is here to discuss her progress with her obesity treatment plan along with follow-up of her obesity related diagnoses. Theresa Mathews is on keeping a food journal and adhering to recommended goals of 1200-1400 calories and 95 grams of protein and states she is following her eating plan approximately 70% of the time. Theresa Mathews states she is doing 0 minutes 0 times per week.  Today's visit was #: 26 Starting weight: 220 lbs Starting date: 07/30/2017 Today's weight: 213 lbs Today's date: 11/24/2021 Total lbs lost to date: 7 lbs Total lbs lost since last in-office visit: 0  Interim History: Theresa Mathews is up 1 lb since her last visit. She struggled with staying on the plan. She feels like she has gotten back on track. She denies side effects with Phentermine.  Subjective:   1. Polyphagia Theresa Mathews is taking Phentermine for appetite, and has decreased food noise.    2. Other hyperlipidemia Theresa Mathews is currently taking medications and levels are controlled.  Assessment/Plan:   1. Polyphagia Theresa Mathews will continue her medications, she will increase her protein and water intake and decrease carbohydrates.  - topiramate (TOPAMAX) 25 MG tablet; Take 1 tablet (25 mg total) by mouth 2 (two) times daily.  Dispense: 60 tablet; Refill: 0 - phentermine 15 MG capsule; Take 1 capsule (15 mg total) by mouth daily with lunch.  Dispense: 30 capsule; Refill: 0  2. Other hyperlipidemia Theresa Mathews will continue her medications as directed.  3. Obesity, Current BMI 36.6 Theresa Mathews is currently in the action stage of change. As such, her goal is to continue with weight loss efforts. She has agreed to keeping a food journal and adhering to recommended goals of 1200-1400 calories and 95 grams of protein.   Meal planning and intentional eating were discussed. We will refill Phentermine 15 mg daily for one month and Topamax 25 mg twice daily for one month.   - topiramate (TOPAMAX) 25 MG tablet;  Take 1 tablet (25 mg total) by mouth 2 (two) times daily.  Dispense: 60 tablet; Refill: 0 - phentermine 15 MG capsule; Take 1 capsule (15 mg total) by mouth daily with lunch.  Dispense: 30 capsule; Refill: 0  Exercise goals: No exercise has been prescribed at this time. Hopes to increase exercise.   Behavioral modification strategies: increasing lean protein intake, decreasing simple carbohydrates, increasing vegetables, increasing water intake, decreasing eating out, no skipping meals, meal planning and cooking strategies, keeping healthy foods in the home, and planning for success.  Theresa Mathews has agreed to follow-up with our clinic in 4 weeks. She was informed of the importance of frequent follow-up visits to maximize her success with intensive lifestyle modifications for her multiple health conditions.   Objective:   Blood pressure 111/73, pulse 60, temperature (!) 97.3 F (36.3 C), height '5\' 4"'$  (1.626 m), weight 213 lb (96.6 kg), SpO2 98 %. Body mass index is 36.56 kg/m.  General: Cooperative, alert, well developed, in no acute distress. HEENT: Conjunctivae and lids unremarkable. Cardiovascular: Regular rhythm.  Lungs: Normal work of breathing. Neurologic: No focal deficits.   Lab Results  Component Value Date   CREATININE 0.99 07/09/2021   BUN 21 07/09/2021   NA 142 07/09/2021   K 4.9 07/09/2021   CL 100 07/09/2021   CO2 22 07/09/2021   Lab Results  Component Value Date   ALT 15 07/09/2021   AST 16 07/09/2021   ALKPHOS 104 07/09/2021   BILITOT 0.5 07/09/2021   Lab Results  Component  Value Date   HGBA1C 5.1 07/09/2021   HGBA1C 5.1 01/28/2021   HGBA1C 5.6 06/26/2020   HGBA1C 5.6 11/16/2019   HGBA1C 5.2 06/14/2019   Lab Results  Component Value Date   INSULIN 7.3 07/09/2021   INSULIN 7.7 01/28/2021   INSULIN 7.4 06/26/2020   INSULIN 10.1 11/16/2019   INSULIN 11.0 06/14/2019   Lab Results  Component Value Date   TSH 1.290 08/26/2017   Lab Results  Component  Value Date   CHOL 226 (H) 07/09/2021   HDL 58 07/09/2021   LDLCALC 149 (H) 07/09/2021   TRIG 106 07/09/2021   CHOLHDL 3.9 07/09/2021   Lab Results  Component Value Date   VD25OH 87.1 07/09/2021   VD25OH 29.1 (L) 01/28/2021   VD25OH 34.8 06/26/2020   Lab Results  Component Value Date   WBC 8.5 07/12/2017   HGB 13.7 07/12/2017   HCT 39.8 07/12/2017   MCV 88.9 07/12/2017   PLT 383.0 07/12/2017   No results found for: "IRON", "TIBC", "FERRITIN"  Attestation Statements:   Reviewed by clinician on day of visit: allergies, medications, problem list, medical history, surgical history, family history, social history, and previous encounter notes.  Graylon Good, am acting as Location manager for [employer], DO.  I have reviewed the above documentation for accuracy and completeness, and I agree with the above. Jearld Lesch, DO

## 2021-12-03 ENCOUNTER — Encounter (INDEPENDENT_AMBULATORY_CARE_PROVIDER_SITE_OTHER): Payer: Self-pay | Admitting: Bariatrics

## 2021-12-25 ENCOUNTER — Encounter: Payer: Self-pay | Admitting: Bariatrics

## 2021-12-25 ENCOUNTER — Ambulatory Visit: Payer: No Typology Code available for payment source | Admitting: Bariatrics

## 2021-12-25 VITALS — BP 135/77 | HR 63 | Temp 98.0°F | Ht 64.0 in | Wt 215.0 lb

## 2021-12-25 DIAGNOSIS — E669 Obesity, unspecified: Secondary | ICD-10-CM

## 2021-12-25 DIAGNOSIS — E88819 Insulin resistance, unspecified: Secondary | ICD-10-CM

## 2021-12-25 DIAGNOSIS — Z6836 Body mass index (BMI) 36.0-36.9, adult: Secondary | ICD-10-CM

## 2021-12-25 DIAGNOSIS — R632 Polyphagia: Secondary | ICD-10-CM

## 2021-12-25 DIAGNOSIS — F509 Eating disorder, unspecified: Secondary | ICD-10-CM

## 2021-12-25 DIAGNOSIS — I10 Essential (primary) hypertension: Secondary | ICD-10-CM

## 2021-12-25 DIAGNOSIS — E559 Vitamin D deficiency, unspecified: Secondary | ICD-10-CM | POA: Diagnosis not present

## 2021-12-25 DIAGNOSIS — E8881 Metabolic syndrome: Secondary | ICD-10-CM

## 2021-12-25 DIAGNOSIS — E66812 Morbid (severe) obesity due to excess calories: Secondary | ICD-10-CM

## 2021-12-25 MED ORDER — TOPIRAMATE 25 MG PO TABS: 25.0000 mg | ORAL_TABLET | Freq: Two times a day (BID) | ORAL | 0 refills | Status: DC

## 2021-12-25 MED ORDER — PHENTERMINE HCL 15 MG PO CAPS: 15.0000 mg | ORAL_CAPSULE | Freq: Every day | ORAL | 0 refills | Status: DC

## 2021-12-26 LAB — HEMOGLOBIN A1C
Est. average glucose Bld gHb Est-mCnc: 103 mg/dL
Hgb A1c MFr Bld: 5.2 % (ref 4.8–5.6)

## 2021-12-26 LAB — COMPREHENSIVE METABOLIC PANEL
ALT: 17 IU/L (ref 0–32)
AST: 18 IU/L (ref 0–40)
Albumin/Globulin Ratio: 1.7 (ref 1.2–2.2)
Albumin: 4.3 g/dL (ref 3.8–4.9)
Alkaline Phosphatase: 96 IU/L (ref 44–121)
BUN/Creatinine Ratio: 25 — ABNORMAL HIGH (ref 9–23)
BUN: 20 mg/dL (ref 6–24)
Bilirubin Total: 0.3 mg/dL (ref 0.0–1.2)
CO2: 24 mmol/L (ref 20–29)
Calcium: 9.2 mg/dL (ref 8.7–10.2)
Chloride: 102 mmol/L (ref 96–106)
Creatinine, Ser: 0.81 mg/dL (ref 0.57–1.00)
Globulin, Total: 2.5 g/dL (ref 1.5–4.5)
Glucose: 81 mg/dL (ref 70–99)
Potassium: 4.5 mmol/L (ref 3.5–5.2)
Sodium: 140 mmol/L (ref 134–144)
Total Protein: 6.8 g/dL (ref 6.0–8.5)
eGFR: 86 mL/min/{1.73_m2} (ref >59)

## 2021-12-26 LAB — LIPID PANEL WITH LDL/HDL RATIO
Cholesterol, Total: 221 mg/dL — ABNORMAL HIGH (ref 100–199)
HDL: 52 mg/dL (ref >39)
LDL Chol Calc (NIH): 128 mg/dL — ABNORMAL HIGH (ref 0–99)
LDL/HDL Ratio: 2.5 ratio (ref 0.0–3.2)
Triglycerides: 235 mg/dL — ABNORMAL HIGH (ref 0–149)
VLDL Cholesterol Cal: 41 mg/dL — ABNORMAL HIGH (ref 5–40)

## 2021-12-26 LAB — VITAMIN D 25 HYDROXY (VIT D DEFICIENCY, FRACTURES): Vit D, 25-Hydroxy: 60.4 ng/mL (ref 30.0–100.0)

## 2021-12-26 LAB — INSULIN, RANDOM: INSULIN: 8.7 u[IU]/mL (ref 2.6–24.9)

## 2021-12-29 NOTE — Progress Notes (Signed)
Chief Complaint:   OBESITY Theresa Mathews is here to discuss her progress with her obesity treatment plan along with follow-up of her obesity related diagnoses. Theresa Mathews is on keeping a food journal and adhering to recommended goals of 1200-1400 calories and 95 grams of protein daily and states she is following her eating plan approximately 70% of the time. Theresa Mathews states she is walking for 20-30 minutes 4 times per week.  Today's visit was #: 68 Starting weight: 220 lbs Starting date: 07/30/2017 Today's weight: 215 lbs Today's date: 12/25/2021 Total lbs lost to date: 5 Total lbs lost since last in-office visit: 0  Interim History: Theresa Mathews is up 2 lbs since her last visit. She did not take her medications during her vacation.   Subjective:   1. Polyphagia Theresa Mathews is taking phentermine. She notes cravings.   2. Insulin resistance Theresa Mathews is due for labs.   3. Vitamin D deficiency Theresa Mathews is taking Vitamin D once weekly.   4. Essential hypertension Theresa Mathews's blood pressure is controlled.   5. Eating disorder, unspecified type Theresa Mathews notes Topamax helps with the cravings.   Assessment/Plan:   1. Polyphagia We will check labs today. Theresa Mathews will continue phentermine as prescribed.   - Comprehensive metabolic panel - Lipid Panel With LDL/HDL Ratio - phentermine 15 MG capsule; Take 1 capsule (15 mg total) by mouth daily with lunch.  Dispense: 30 capsule; Refill: 0  2. Insulin resistance We will check labs today, and we will follow-up at Theresa Mathews's next visit.  - Insulin, random - Hemoglobin A1c - Lipid Panel With LDL/HDL Ratio  3. Vitamin D deficiency We will check labs today, and Theresa Mathews will continue prescription Vitamin D once weekly.    - VITAMIN D 25 Hydroxy (Vit-D Deficiency, Fractures)  4. Essential hypertension Theresa Mathews will continue her medications as directed.   5. Eating disorder, unspecified type Theresa Mathews will continue Topamax 25 mg BID, and we will refill for 1 month.    - topiramate (TOPAMAX) 25 MG tablet; Take 1 tablet (25 mg total) by mouth 2 (two) times daily.  Dispense: 60 tablet; Refill: 0  6. Obesity, Current BMI 36.6 Theresa Mathews is currently in the action stage of change. As such, her goal is to continue with weight loss efforts. She has agreed to keeping a food journal and adhering to recommended goals of 1200-1400 calories and 95 grams of protein daily.    We discussed various medication options to help Theresa Mathews with her weight loss efforts and we both agreed to continue phentermine 15 mg daily with lunch and we will refill for 1 month.  - phentermine 15 MG capsule; Take 1 capsule (15 mg total) by mouth daily with lunch.  Dispense: 30 capsule; Refill: 0  Exercise goals: Increase walking and increase muscle mass about 2 lbs.  Behavioral modification strategies: increasing lean protein intake, decreasing simple carbohydrates, increasing vegetables, increasing water intake, decreasing eating out, no skipping meals, meal planning and cooking strategies, keeping healthy foods in the home, and planning for success.  Theresa Mathews has agreed to follow-up with our clinic in 4 weeks. She was informed of the importance of frequent follow-up visits to maximize her success with intensive lifestyle modifications for her multiple health conditions.   Objective:   Blood pressure 135/77, pulse 63, temperature 98 F (36.7 C), height '5\' 4"'$  (1.626 m), weight 215 lb (97.5 kg), SpO2 100 %. Body mass index is 36.9 kg/m.  General: Cooperative, alert, well developed, in no acute distress. HEENT: Conjunctivae and lids unremarkable.  Cardiovascular: Regular rhythm.  Lungs: Normal work of breathing. Neurologic: No focal deficits.   Lab Results  Component Value Date   CREATININE 0.81 12/25/2021   BUN 20 12/25/2021   NA 140 12/25/2021   K 4.5 12/25/2021   CL 102 12/25/2021   CO2 24 12/25/2021   Lab Results  Component Value Date   ALT 17 12/25/2021   AST 18 12/25/2021    ALKPHOS 96 12/25/2021   BILITOT 0.3 12/25/2021   Lab Results  Component Value Date   HGBA1C 5.2 12/25/2021   HGBA1C 5.1 07/09/2021   HGBA1C 5.1 01/28/2021   HGBA1C 5.6 06/26/2020   HGBA1C 5.6 11/16/2019   Lab Results  Component Value Date   INSULIN 8.7 12/25/2021   INSULIN 7.3 07/09/2021   INSULIN 7.7 01/28/2021   INSULIN 7.4 06/26/2020   INSULIN 10.1 11/16/2019   Lab Results  Component Value Date   TSH 1.290 08/26/2017   Lab Results  Component Value Date   CHOL 221 (H) 12/25/2021   HDL 52 12/25/2021   LDLCALC 128 (H) 12/25/2021   TRIG 235 (H) 12/25/2021   CHOLHDL 3.9 07/09/2021   Lab Results  Component Value Date   VD25OH 60.4 12/25/2021   VD25OH 87.1 07/09/2021   VD25OH 29.1 (L) 01/28/2021   Lab Results  Component Value Date   WBC 8.5 07/12/2017   HGB 13.7 07/12/2017   HCT 39.8 07/12/2017   MCV 88.9 07/12/2017   PLT 383.0 07/12/2017   No results found for: "IRON", "TIBC", "FERRITIN"  Attestation Statements:   Reviewed by clinician on day of visit: allergies, medications, problem list, medical history, surgical history, family history, social history, and previous encounter notes.   Wilhemena Durie, am acting as Location manager for [employer], DO.  I have reviewed the above documentation for accuracy and completeness, and I agree with the above. Jearld Lesch, DO

## 2022-01-20 ENCOUNTER — Encounter: Payer: Self-pay | Admitting: Bariatrics

## 2022-01-20 ENCOUNTER — Ambulatory Visit: Payer: No Typology Code available for payment source | Admitting: Bariatrics

## 2022-01-20 DIAGNOSIS — E669 Obesity, unspecified: Secondary | ICD-10-CM | POA: Diagnosis not present

## 2022-01-20 DIAGNOSIS — F5089 Other specified eating disorder: Secondary | ICD-10-CM

## 2022-01-20 DIAGNOSIS — Z6837 Body mass index (BMI) 37.0-37.9, adult: Secondary | ICD-10-CM

## 2022-01-20 DIAGNOSIS — R632 Polyphagia: Secondary | ICD-10-CM | POA: Diagnosis not present

## 2022-01-20 MED ORDER — TOPIRAMATE 25 MG PO TABS: 25.0000 mg | ORAL_TABLET | Freq: Two times a day (BID) | ORAL | 0 refills | Status: DC

## 2022-01-20 MED ORDER — PHENTERMINE HCL 30 MG PO CAPS: 30.0000 mg | ORAL_CAPSULE | ORAL | 0 refills | Status: DC

## 2022-01-25 NOTE — Progress Notes (Signed)
Chief Complaint:   OBESITY Theresa Mathews is here to discuss her progress with her obesity treatment plan along with follow-up of her obesity related diagnoses. Theresa Mathews is on keeping a food journal and adhering to recommended goals of 1200-1400 calories and 95 grams of protein and states she is following her eating plan approximately 60% of the time. Theresa Mathews states she is walking for 20 minutes 2 times per week.  Today's visit was #: 78 Starting weight: 220 lbs Starting date: 07/30/2017 Today's weight: 216 lbs Today's date: 01/20/2022 Total lbs lost to date: 4 Total lbs lost since last in-office visit: 0  Interim History: Theresa Mathews is up 1 pound since her last visit.  She strained her back and she is not moving as much.  She is not getting enough water.  Subjective:   1. Polyphagia Theresa Mathews denies side effects with phentermine.  2. Other disorder of eating Theresa Mathews is taking Topamax as directed.  Assessment/Plan:   1. Polyphagia Theresa Mathews will continue her medications, and we will refill phentermine 30 mg once daily for 1 month.  Foods high on fiber handout was given today.  - phentermine 30 MG capsule; Take 1 capsule (30 mg total) by mouth every morning.  Dispense: 30 capsule; Refill: 0  2. Other disorder of eating Theresa Mathews will continue Topamax 25 mg twice daily, and we will refill for 1 month.  - topiramate (TOPAMAX) 25 MG tablet; Take 1 tablet (25 mg total) by mouth 2 (two) times daily.  Dispense: 60 tablet; Refill: 0  3. Obesity, Current BMI 37.1 Theresa Mathews is currently in the action stage of change. As such, her goal is to continue with weight loss efforts. She has agreed to keeping a food journal and adhering to recommended goals of 1400 calories and 95 grams of protein.   She will adhere to the plan 80-90%.  Meal planning was discussed.  She will keep her water intake high and will track her calories and protein daily.  Labs were reviewed with the patient from 12/25/2021, CMP, lipids, A1c,  and insulin.  Exercise goals: As is.   Behavioral modification strategies: increasing lean protein intake, decreasing simple carbohydrates, increasing vegetables, increasing water intake, decreasing eating out, no skipping meals, meal planning and cooking strategies, keeping healthy foods in the home, and planning for success.  Theresa Mathews has agreed to follow-up with our clinic in 4 weeks. She was informed of the importance of frequent follow-up visits to maximize her success with intensive lifestyle modifications for her multiple health conditions.   Objective:   Blood pressure 127/80, height '5\' 4"'$  (1.626 m), weight 216 lb (98 kg). Body mass index is 37.08 kg/m.  General: Cooperative, alert, well developed, in no acute distress. HEENT: Conjunctivae and lids unremarkable. Cardiovascular: Regular rhythm.  Lungs: Normal work of breathing. Neurologic: No focal deficits.   Lab Results  Component Value Date   CREATININE 0.81 12/25/2021   BUN 20 12/25/2021   NA 140 12/25/2021   K 4.5 12/25/2021   CL 102 12/25/2021   CO2 24 12/25/2021   Lab Results  Component Value Date   ALT 17 12/25/2021   AST 18 12/25/2021   ALKPHOS 96 12/25/2021   BILITOT 0.3 12/25/2021   Lab Results  Component Value Date   HGBA1C 5.2 12/25/2021   HGBA1C 5.1 07/09/2021   HGBA1C 5.1 01/28/2021   HGBA1C 5.6 06/26/2020   HGBA1C 5.6 11/16/2019   Lab Results  Component Value Date   INSULIN 8.7 12/25/2021   INSULIN 7.3  07/09/2021   INSULIN 7.7 01/28/2021   INSULIN 7.4 06/26/2020   INSULIN 10.1 11/16/2019   Lab Results  Component Value Date   TSH 1.290 08/26/2017   Lab Results  Component Value Date   CHOL 221 (H) 12/25/2021   HDL 52 12/25/2021   LDLCALC 128 (H) 12/25/2021   TRIG 235 (H) 12/25/2021   CHOLHDL 3.9 07/09/2021   Lab Results  Component Value Date   VD25OH 60.4 12/25/2021   VD25OH 87.1 07/09/2021   VD25OH 29.1 (L) 01/28/2021   Lab Results  Component Value Date   WBC 8.5 07/12/2017    HGB 13.7 07/12/2017   HCT 39.8 07/12/2017   MCV 88.9 07/12/2017   PLT 383.0 07/12/2017   No results found for: "IRON", "TIBC", "FERRITIN"  Attestation Statements:   Reviewed by clinician on day of visit: allergies, medications, problem list, medical history, surgical history, family history, social history, and previous encounter notes.   Wilhemena Durie, am acting as Location manager for [employer], DO.  I have reviewed the above documentation for accuracy and completeness, and I agree with the above. Jearld Lesch, DO

## 2022-02-05 ENCOUNTER — Encounter: Payer: Self-pay | Admitting: Bariatrics

## 2022-02-17 ENCOUNTER — Encounter: Payer: Self-pay | Admitting: Bariatrics

## 2022-02-17 ENCOUNTER — Ambulatory Visit: Payer: No Typology Code available for payment source | Admitting: Bariatrics

## 2022-02-17 VITALS — BP 124/75 | HR 82 | Temp 97.8°F | Ht 64.0 in | Wt 217.0 lb

## 2022-02-17 DIAGNOSIS — E669 Obesity, unspecified: Secondary | ICD-10-CM | POA: Diagnosis not present

## 2022-02-17 DIAGNOSIS — Z6837 Body mass index (BMI) 37.0-37.9, adult: Secondary | ICD-10-CM

## 2022-02-17 DIAGNOSIS — R632 Polyphagia: Secondary | ICD-10-CM | POA: Diagnosis not present

## 2022-02-17 DIAGNOSIS — F5089 Other specified eating disorder: Secondary | ICD-10-CM | POA: Diagnosis not present

## 2022-02-17 MED ORDER — TOPIRAMATE 50 MG PO TABS: 50.0000 mg | ORAL_TABLET | Freq: Two times a day (BID) | ORAL | 0 refills | Status: DC

## 2022-02-17 MED ORDER — PHENTERMINE HCL 30 MG PO CAPS: 30.0000 mg | ORAL_CAPSULE | ORAL | 0 refills | Status: DC

## 2022-03-02 ENCOUNTER — Encounter: Payer: Self-pay | Admitting: Bariatrics

## 2022-03-02 NOTE — Progress Notes (Signed)
Chief Complaint:   OBESITY Theresa Mathews is here to discuss her progress with her obesity treatment plan along with follow-up of her obesity related diagnoses. Theresa Mathews is on keeping a food journal and adhering to recommended goals of 1400 calories and 95 grams of protein and states she is following her eating plan approximately 70% of the time. Theresa Mathews states she is doing 0 minutes 0 times per week.  Today's visit was #: 83 Starting weight: 220 lbs Starting date: 07/30/2017 Today's weight: 217 lbs Today's date: 02/17/2022 Total lbs lost to date: 3 Total lbs lost since last in-office visit: 0  Interim History: Theresa Mathews is up 1 lb since her last visit.   Subjective:   1. Polyphagia Theresa Mathews is currently taking phentermine.   2. Other disorder of eating Theresa Mathews notes some stress eating recently.   Assessment/Plan:   1. Polyphagia Theresa Mathews will continue phentermine 30 mg once daily, and we will refill for 1 month. She will work on increasing her protein and fiber intake.   - phentermine 30 MG capsule; Take 1 capsule (30 mg total) by mouth every morning.  Dispense: 30 capsule; Refill: 0  2. Other disorder of eating Theresa Mathews agreed to increase Topamax from 25 mg to 50 mg BID, and we will refill for 1 month.  - topiramate (TOPAMAX) 50 MG tablet; Take 1 tablet (50 mg total) by mouth 2 (two) times daily.  Dispense: 60 tablet; Refill: 0  3. Obesity, Current BMI 37.3 Theresa Mathews is currently in the action stage of change. As such, her goal is to continue with weight loss efforts. She has agreed to keeping a food journal and adhering to recommended goals of 1400 calories and 95 grams of protein.   Meal planning was discussed.  She will adhere closely to the plan.  She will choose healthy snacks.  Exercise goals: Work on being more active.   Behavioral modification strategies: increasing lean protein intake, decreasing simple carbohydrates, increasing vegetables, increasing water intake, decreasing  eating out, no skipping meals, meal planning and cooking strategies, keeping healthy foods in the home, and planning for success.  Theresa Mathews has agreed to follow-up with our clinic in 4 weeks with Everardo Pacific, FNP-C. She was informed of the importance of frequent follow-up visits to maximize her success with intensive lifestyle modifications for her multiple health conditions.   Objective:   Blood pressure 124/75, pulse 82, temperature 97.8 F (36.6 C), height '5\' 4"'$  (1.626 m), weight 217 lb (98.4 kg), SpO2 97 %. Body mass index is 37.25 kg/m.  General: Cooperative, alert, well developed, in no acute distress. HEENT: Conjunctivae and lids unremarkable. Cardiovascular: Regular rhythm.  Lungs: Normal work of breathing. Neurologic: No focal deficits.   Lab Results  Component Value Date   CREATININE 0.81 12/25/2021   BUN 20 12/25/2021   NA 140 12/25/2021   K 4.5 12/25/2021   CL 102 12/25/2021   CO2 24 12/25/2021   Lab Results  Component Value Date   ALT 17 12/25/2021   AST 18 12/25/2021   ALKPHOS 96 12/25/2021   BILITOT 0.3 12/25/2021   Lab Results  Component Value Date   HGBA1C 5.2 12/25/2021   HGBA1C 5.1 07/09/2021   HGBA1C 5.1 01/28/2021   HGBA1C 5.6 06/26/2020   HGBA1C 5.6 11/16/2019   Lab Results  Component Value Date   INSULIN 8.7 12/25/2021   INSULIN 7.3 07/09/2021   INSULIN 7.7 01/28/2021   INSULIN 7.4 06/26/2020   INSULIN 10.1 11/16/2019   Lab Results  Component Value Date   TSH 1.290 08/26/2017   Lab Results  Component Value Date   CHOL 221 (H) 12/25/2021   HDL 52 12/25/2021   LDLCALC 128 (H) 12/25/2021   TRIG 235 (H) 12/25/2021   CHOLHDL 3.9 07/09/2021   Lab Results  Component Value Date   VD25OH 60.4 12/25/2021   VD25OH 87.1 07/09/2021   VD25OH 29.1 (L) 01/28/2021   Lab Results  Component Value Date   WBC 8.5 07/12/2017   HGB 13.7 07/12/2017   HCT 39.8 07/12/2017   MCV 88.9 07/12/2017   PLT 383.0 07/12/2017   No results found  for: "IRON", "TIBC", "FERRITIN"  Attestation Statements:   Reviewed by clinician on day of visit: allergies, medications, problem list, medical history, surgical history, family history, social history, and previous encounter notes.   Wilhemena Durie, am acting as Location manager for [employer], DO.  I have reviewed the above documentation for accuracy and completeness, and I agree with the above. Jearld Lesch, DO

## 2022-03-17 ENCOUNTER — Ambulatory Visit: Payer: No Typology Code available for payment source | Admitting: Bariatrics

## 2022-03-17 ENCOUNTER — Encounter: Payer: Self-pay | Admitting: Bariatrics

## 2022-03-17 VITALS — BP 143/84 | HR 68 | Temp 97.9°F | Ht 64.0 in | Wt 221.0 lb

## 2022-03-17 DIAGNOSIS — Z6838 Body mass index (BMI) 38.0-38.9, adult: Secondary | ICD-10-CM | POA: Diagnosis not present

## 2022-03-17 DIAGNOSIS — E669 Obesity, unspecified: Secondary | ICD-10-CM | POA: Diagnosis not present

## 2022-03-17 DIAGNOSIS — R632 Polyphagia: Secondary | ICD-10-CM

## 2022-03-17 DIAGNOSIS — F5089 Other specified eating disorder: Secondary | ICD-10-CM | POA: Diagnosis not present

## 2022-03-17 MED ORDER — TOPIRAMATE 50 MG PO TABS: 50.0000 mg | ORAL_TABLET | Freq: Two times a day (BID) | ORAL | 0 refills | Status: DC

## 2022-03-17 MED ORDER — PHENTERMINE HCL 30 MG PO CAPS: 30.0000 mg | ORAL_CAPSULE | ORAL | 0 refills | Status: DC

## 2022-04-01 NOTE — Patient Instructions (Signed)

## 2022-04-07 ENCOUNTER — Encounter: Payer: Self-pay | Admitting: Bariatrics

## 2022-04-07 ENCOUNTER — Other Ambulatory Visit: Payer: Self-pay | Admitting: Family Medicine

## 2022-04-07 DIAGNOSIS — Z1231 Encounter for screening mammogram for malignant neoplasm of breast: Secondary | ICD-10-CM

## 2022-04-07 NOTE — Progress Notes (Signed)
Chief Complaint:   OBESITY Theresa Mathews is here to discuss her progress with her obesity treatment plan along with follow-up of her obesity related diagnoses. Theresa Mathews is on keeping a food journal and adhering to recommended goals of 1400 calories and 95 grams of protein and states she is following her eating plan approximately 60% of the time. Theresa Mathews states she is doing 0 minutes 0 times per week.  Today's visit was #: 3 Starting weight: 220 lbs Starting date: 07/30/2017 Today's weight: 221 lbs Today's date: 03/17/2022 Total lbs lost to date: 0 Total lbs lost since last in-office visit: 0  Interim History: Theresa Mathews is up 4 lbs since over the holiday season. She does well with breakfast and lunch, but she struggles with dinner.   Subjective:   1. Polyphagia [name] is taking phentermine with no side effects. PDMP was checked.   2. Other disorder of eating [name] notes Topamax helps with her emotional eating.   Assessment/Plan:   1. Polyphagia Theresa Mathews will continue phentermine 30 mg once daily, and we will refill for 1 month.   - phentermine 30 MG capsule; Take 1 capsule (30 mg total) by mouth every morning.  Dispense: 30 capsule; Refill: 0  2. Other disorder of eating Theresa Mathews will continue Topamax 50 mg BID, and we will refill for 1 month.   - topiramate (TOPAMAX) 50 MG tablet; Take 1 tablet (50 mg total) by mouth 2 (two) times daily.  Dispense: 60 tablet; Refill: 0  3. Obesity, Current BMI 38.1 Theresa Mathews is currently in the action stage of change. As such, her goal is to continue with weight loss efforts. She has agreed to keeping a food journal and adhering to recommended goals of 1400 calories and 80-90 grams of protein.   Meal planning and intentional eating were discussed. She is to have a protein shake if she does not eat a meal. Mindful eating and journaling were discussed.   Exercise goals: No exercise has been prescribed at this time.  Behavioral modification strategies:  increasing lean protein intake, decreasing simple carbohydrates, increasing vegetables, increasing water intake, decreasing eating out, no skipping meals, meal planning and cooking strategies, keeping healthy foods in the home, and planning for success.  Theresa Mathews has agreed to follow-up with our clinic in 4 weeks. She was informed of the importance of frequent follow-up visits to maximize her success with intensive lifestyle modifications for her multiple health conditions.   Objective:   Blood pressure (!) 143/84, pulse 68, temperature 97.9 F (36.6 C), height '5\' 4"'$  (1.626 m), weight 221 lb (100.2 kg), SpO2 97 %. Body mass index is 37.93 kg/m.  General: Cooperative, alert, well developed, in no acute distress. HEENT: Conjunctivae and lids unremarkable. Cardiovascular: Regular rhythm.  Lungs: Normal work of breathing. Neurologic: No focal deficits.   Lab Results  Component Value Date   CREATININE 0.81 12/25/2021   BUN 20 12/25/2021   NA 140 12/25/2021   K 4.5 12/25/2021   CL 102 12/25/2021   CO2 24 12/25/2021   Lab Results  Component Value Date   ALT 17 12/25/2021   AST 18 12/25/2021   ALKPHOS 96 12/25/2021   BILITOT 0.3 12/25/2021   Lab Results  Component Value Date   HGBA1C 5.2 12/25/2021   HGBA1C 5.1 07/09/2021   HGBA1C 5.1 01/28/2021   HGBA1C 5.6 06/26/2020   HGBA1C 5.6 11/16/2019   Lab Results  Component Value Date   INSULIN 8.7 12/25/2021   INSULIN 7.3 07/09/2021   INSULIN 7.7  01/28/2021   INSULIN 7.4 06/26/2020   INSULIN 10.1 11/16/2019   Lab Results  Component Value Date   TSH 1.290 08/26/2017   Lab Results  Component Value Date   CHOL 221 (H) 12/25/2021   HDL 52 12/25/2021   LDLCALC 128 (H) 12/25/2021   TRIG 235 (H) 12/25/2021   CHOLHDL 3.9 07/09/2021   Lab Results  Component Value Date   VD25OH 60.4 12/25/2021   VD25OH 87.1 07/09/2021   VD25OH 29.1 (L) 01/28/2021   Lab Results  Component Value Date   WBC 8.5 07/12/2017   HGB 13.7  07/12/2017   HCT 39.8 07/12/2017   MCV 88.9 07/12/2017   PLT 383.0 07/12/2017   No results found for: "IRON", "TIBC", "FERRITIN"  Attestation Statements:   Reviewed by clinician on day of visit: allergies, medications, problem list, medical history, surgical history, family history, social history, and previous encounter notes.   Wilhemena Durie, am acting as Location manager for [employer], DO.  I have reviewed the above documentation for accuracy and completeness, and I agree with the above. Jearld Lesch, DO

## 2022-04-14 ENCOUNTER — Ambulatory Visit: Payer: No Typology Code available for payment source | Admitting: Bariatrics

## 2022-05-18 ENCOUNTER — Encounter: Payer: Self-pay | Admitting: *Deleted

## 2022-05-18 NOTE — Progress Notes (Unsigned)
PATIENT: Theresa Mathews DOB: 1965-11-09  REASON FOR VISIT: follow up HISTORY FROM: patient PRIMARY NEUROLOGIST: Dr. Rexene Alberts  HISTORY OF PRESENT ILLNESS: Today 05/18/22:  TYENA Mathews is a 57 y.o. female with a history of OSA On CPAP. Returns today for follow-up.      05/15/21: Theresa Mathews is a 57 year old female with a history of obstructive sleep apnea on CPAP.  She returns today for follow-up.  Her download indicates that she used her machine nightly for compliance of 100%.  She used her machine greater than 4 hours for compliance of 77%.  On average she uses her machine 5 and half hours most nights.  Her residual AHI is 1 on 6 to 14 cm of water.  She reports that she has noticed the benefit with the CPAP.  She states that she feels like she is getting better sleep and more rested during the day.  Returns today for an evaluation.  HISTORY (copied from Dr. Guadelupe Sabin note)  Theresa Mathews is a 57 year old right-handed woman with an underlying medical history of reflux disease, lactose intolerance, insulin resistance, vitamin D deficiency, hypertension, hyperlipidemia and obesity, who reports snoring and excessive daytime somnolence.  She has woken up with a sense of gasping for air.  I reviewed your office note from 09/16/2020.  Her Epworth sleepiness score is 14 out of 24, fatigue severity score is 44 out of 63.  She has snored for years, her daytime somnolence has increased over time and her snoring has worsened especially with weight gain.  She has had trouble losing weight.  She follows with weight management.  She just recently started Mounjaro injections 8 weeks ago.  She has lost a little bit of weight thus far.  She has been on Wellbutrin but admits that she is not very good about taking day-to-day oral medications.  She goes to bed around 11 and rise time is between 630 and 7.  She has nocturia once per average night and has occasionally woken up with a headache in the morning, this is  a dull, achy, mild frontal headache and sometimes she takes Excedrin.  She does not have a history of migraines.  Her mom snores and has a diagnosis of A. fib.  Patient reports feeling tired all the time and having lack of energy.  They have 1 dog in the household, she lives with her husband and youngest daughter.  She has 2 children altogether.  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  Her commute is now only 10 minutes.  When she had a longer commute she does report feeling sleepy at the wheel and has dozed off in the past at the wheel, thankfully never had a car accident.  She drinks caffeine in the form of coffee or tea, 1 or 2 cups in the mornings, occasional soda, not daily, drinks alcohol about 3 days out of the week and is a non-smoker.  She has no TV in the bedroom.   REVIEW OF SYSTEMS: Out of a complete 14 system review of symptoms, the patient complains only of the following symptoms, and all other reviewed systems are negative.   ESS 8  ALLERGIES: No Known Allergies  HOME MEDICATIONS: Outpatient Medications Prior to Visit  Medication Sig Dispense Refill   buPROPion (WELLBUTRIN XL) 300 MG 24 hr tablet Take 1 tablet (300 mg total) by mouth daily. 90 tablet 0   Cholecalciferol (VITAMIN D3) 1.25 MG (50000 UT) CAPS Take 1 capsule  by mouth every 3 (three) days. 10 capsule 0   citalopram (CELEXA) 20 MG tablet Take 1 tablet (20 mg total) by mouth daily. 90 tablet 0   phentermine 30 MG capsule Take 1 capsule (30 mg total) by mouth every morning. 30 capsule 0   ranitidine (ZANTAC) 75 MG tablet Take 75 mg by mouth daily at 6 (six) AM.      telmisartan (MICARDIS) 80 MG tablet Take 1 tablet (80 mg total) by mouth daily. 90 tablet 0   topiramate (TOPAMAX) 50 MG tablet Take 1 tablet (50 mg total) by mouth 2 (two) times daily. 60 tablet 0   No facility-administered medications prior to visit.    PAST MEDICAL HISTORY: Past Medical History:  Diagnosis Date   Anxiety     Back pain    Depression    Fatigue    GERD (gastroesophageal reflux disease)    Lactose intolerance     PAST SURGICAL HISTORY: Past Surgical History:  Procedure Laterality Date   BLADDER SUSPENSION N/A 01/23/2015   Procedure: TRANSVAGINAL TAPE (TVT) PROCEDURE;  Surgeon: Bobbye Charleston, MD;  Location: [facility];  Service: Gynecology;  Laterality: N/A;   CYSTOSCOPY N/A 01/23/2015   Procedure: CYSTOSCOPY;  Surgeon: Bobbye Charleston, MD;  Location: [facility];  Service: Gynecology;  Laterality: N/A;   DILATION AND CURETTAGE OF UTERUS  2005   ROBOTIC ASSISTED TOTAL HYSTERECTOMY WITH BILATERAL SALPINGO OOPHERECTOMY Bilateral 01/23/2015   Procedure: ROBOTIC ASSISTED TOTAL HYSTERECTOMY WITH BILATERAL SALPINGO OOPHORECTOMY;  Surgeon: Bobbye Charleston, MD;  Location: [facility];  Service: Gynecology;  Laterality: Bilateral;    FAMILY HISTORY: Family History  Problem Relation Age of Onset   Hypertension Mother    Ovarian cancer Mother    Heart disease Mother 37       a fib   Obesity Mother    Hypertension Father    Diabetes Father    Hyperlipidemia Father    Obesity Father     SOCIAL HISTORY: Social History   Socioeconomic History   Marital status: Married    Spouse name: Libni Buttacavoli   Number of children: 2   Years of education: Not on file   Highest education level: Not on file  Occupational History   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    Comment: [employer]  Tobacco Use   Smoking status: Former    Packs/day: 0.50    Years: 4.00    Total pack years: 2.00    Types: Cigarettes    Quit date: 1994    Years since quitting: 30.1   Smokeless tobacco: Never   Tobacco comments:    smoked in college  Vaping Use   Vaping Use: Never used  Substance and Sexual Activity   Alcohol use: Yes    Alcohol/week: 5.0 standard drinks of alcohol    Types: 5 Glasses of wine per week   Drug use: No   Sexual activity: Yes    Partners: Male  Other Topics Concern   Not on file  Social History  Narrative   Exercise- walks 4 times a week   Caffeine: 1-2 cups daily   Education: BS textile [place]   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    Social Determinants of Health   Financial Resource Strain: Not on file  Food Insecurity: Not on file  Transportation Needs: Not on file  Physical Activity: Not on file  Stress: Not on file  Social Connections: Not on file  Intimate Partner Violence: Not on file  PHYSICAL EXAM  There were no vitals filed for this visit.  There is no height or weight on file to calculate BMI.  Generalized: Well developed, in no acute distress  Chest: Lungs clear to auscultation bilaterally  Neurological examination  Mentation: Alert oriented to time, place, history taking. Follows all commands speech and language fluent Cranial nerve II-XII: Extraocular movements were full, visual field were full on confrontational test Head turning and shoulder shrug  were normal and symmetric. Motor: The motor testing reveals 5 over 5 strength of all 4 extremities. Good symmetric motor tone is noted throughout.  Sensory: Sensory testing is intact to soft touch on all 4 extremities. No evidence of extinction is noted.  Gait and station: Gait is normal.    DIAGNOSTIC DATA (LABS, IMAGING, TESTING) - I reviewed patient records, labs, notes, testing and imaging myself where available.  Lab Results  Component Value Date   WBC 8.5 07/12/2017   HGB 13.7 07/12/2017   HCT 39.8 07/12/2017   MCV 88.9 07/12/2017   PLT 383.0 07/12/2017      Component Value Date/Time   NA 140 12/25/2021 0810   K 4.5 12/25/2021 0810   CL 102 12/25/2021 0810   CO2 24 12/25/2021 0810   GLUCOSE 81 12/25/2021 0810   GLUCOSE 97 07/12/2017 1011   BUN 20 12/25/2021 0810   CREATININE 0.81 12/25/2021 0810   CALCIUM 9.2 12/25/2021 0810   PROT 6.8 12/25/2021 0810   ALBUMIN 4.3 12/25/2021 0810   AST 18 12/25/2021 0810   ALT 17 12/25/2021 0810   ALKPHOS 96 12/25/2021 0810   BILITOT  0.3 12/25/2021 0810   GFRNONAA 87 11/16/2019 1252   GFRAA 100 11/16/2019 1252   Lab Results  Component Value Date   CHOL 221 (H) 12/25/2021   HDL 52 12/25/2021   LDLCALC 128 (H) 12/25/2021   TRIG 235 (H) 12/25/2021   CHOLHDL 3.9 07/09/2021   Lab Results  Component Value Date   HGBA1C 5.2 12/25/2021   Lab Results  Component Value Date   VITAMINB12 536 02/02/2019   Lab Results  Component Value Date   TSH 1.290 08/26/2017      ASSESSMENT AND PLAN 57 y.o. year old female  has a past medical history of Anxiety, Back pain, Depression, Fatigue, GERD (gastroesophageal reflux disease), and Lactose intolerance. here with:  OSA on CPAP  - CPAP compliance excellent - Good treatment of AHI  - Encourage patient to use CPAP nightly and > 4 hours each night - F/U in 1 year or sooner if needed   Ward Givens, MSN, NP-C 05/18/2022, 4:34 PM [facility] [address] [place], [place] [address] [phone]

## 2022-05-19 ENCOUNTER — Ambulatory Visit: Payer: No Typology Code available for payment source | Admitting: Adult Health

## 2022-05-19 ENCOUNTER — Encounter: Payer: Self-pay | Admitting: Adult Health

## 2022-05-19 VITALS — BP 137/74 | HR 68 | Ht 64.0 in | Wt 222.6 lb

## 2022-05-19 DIAGNOSIS — G4733 Obstructive sleep apnea (adult) (pediatric): Secondary | ICD-10-CM | POA: Diagnosis not present

## 2022-05-19 DIAGNOSIS — G479 Sleep disorder, unspecified: Secondary | ICD-10-CM

## 2022-05-19 NOTE — Patient Instructions (Signed)
Can try Melatonin 1-3 mg about 90 minutes before bedtime.  Continue CPAP If your symptoms worsen or you develop new symptoms please let us know.

## 2022-05-27 ENCOUNTER — Ambulatory Visit
Admission: RE | Admit: 2022-05-27 | Discharge: 2022-05-27 | Disposition: A | Discharge status: Home / Self Care | Payer: No Typology Code available for payment source | Source: Ambulatory Visit

## 2022-05-27 DIAGNOSIS — Z1231 Encounter for screening mammogram for malignant neoplasm of breast: Secondary | ICD-10-CM

## 2022-07-09 ENCOUNTER — Ambulatory Visit: Payer: No Typology Code available for payment source | Admitting: Family Medicine

## 2022-07-09 VITALS — BP 138/80 | HR 71 | Temp 97.7°F | Resp 18 | Ht 64.0 in | Wt 221.8 lb

## 2022-07-09 DIAGNOSIS — M722 Plantar fascial fibromatosis: Secondary | ICD-10-CM | POA: Diagnosis not present

## 2022-07-09 NOTE — Progress Notes (Addendum)
Subjective:   By signing my name below, I, Shehryar Baig, attest that this documentation has been prepared under the direction and in the presence of Donato Schultz, DO. 07/09/2022   Patient ID: Theresa Mathews, female    DOB: Jan 07, 1966, 57 y.o.   MRN: [id number]  Chief Complaint  Patient presents with   Foot Pain    Left heel and ankle pain, x3 weeks ago, some swelling, no injury.     Foot Pain   Patient is in today for a office visit.   She complains of left heel pain and ankle pain for the past 3 weeks. She has mild swelling but notes having no prior injury. Her pain worsens in the morning or when she starts walking after resting. She is walking on hard floor more often. She started yoga recently and noticed some exercised worsened her pain. She is applying ice to her ankle and heel at night.    Past Medical History:  Diagnosis Date   Anxiety    Back pain    Depression    Fatigue    GERD (gastroesophageal reflux disease)    Lactose intolerance     Past Surgical History:  Procedure Laterality Date   BLADDER SUSPENSION N/A 01/23/2015   Procedure: TRANSVAGINAL TAPE (TVT) PROCEDURE;  Surgeon: Carrington Clamp, MD;  Location: [facility];  Service: Gynecology;  Laterality: N/A;   CYSTOSCOPY N/A 01/23/2015   Procedure: CYSTOSCOPY;  Surgeon: Carrington Clamp, MD;  Location: [facility];  Service: Gynecology;  Laterality: N/A;   DILATION AND CURETTAGE OF UTERUS  2005   ROBOTIC ASSISTED TOTAL HYSTERECTOMY WITH BILATERAL SALPINGO OOPHERECTOMY Bilateral 01/23/2015   Procedure: ROBOTIC ASSISTED TOTAL HYSTERECTOMY WITH BILATERAL SALPINGO OOPHORECTOMY;  Surgeon: Carrington Clamp, MD;  Location: [facility];  Service: Gynecology;  Laterality: Bilateral;    Family History  Problem Relation Age of Onset   Hypertension Mother    Ovarian cancer Mother    Heart disease Mother 69       a fib   Obesity Mother    Hypertension Father    Diabetes Father    Hyperlipidemia Father    Obesity  Father    Sleep apnea Neg Hx     Social History   Socioeconomic History   Marital status: Married    Spouse name: Koa Crickmore   Number of children: 2   Years of education: Not on file   Highest education level: Bachelor's degree (e.g., BA, AB, BS)  Occupational History   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    Comment: [employer]  Tobacco Use   Smoking status: Former    Packs/day: 0.50    Years: 4.00    Additional pack years: 0.00    Total pack years: 2.00    Types: Cigarettes    Quit date: 1994    Years since quitting: 30.2   Smokeless tobacco: Never   Tobacco comments:    smoked in college  Vaping Use   Vaping Use: Never used  Substance and Sexual Activity   Alcohol use: Not Currently    Comment: occ   Drug use: No   Sexual activity: Yes    Partners: Male  Other Topics Concern   Not on file  Social History Narrative   Exercise- walks 4 times a week   Caffeine: 1-2 cups daily   Education: BS textile [place]   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    Social Determinants of Health   Financial Resource Strain: Low Risk  (  07/09/2022)   Overall Financial Resource Strain (CARDIA)    Difficulty of Paying Living Expenses: Not very hard  Food Insecurity: No Food Insecurity (07/09/2022)   Hunger Vital Sign    Worried About Running Out of Food in the Last Year: Never true    Ran Out of Food in the Last Year: Never true  Transportation Needs: No Transportation Needs (07/09/2022)   PRAPARE - Administrator, Civil ServiceTransportation    Lack of Transportation (Medical): No    Lack of Transportation (Non-Medical): No  Physical Activity: Insufficiently Active (07/09/2022)   Exercise Vital Sign    Days of Exercise per Week: 3 days    Minutes of Exercise per Session: 40 min  Stress: Stress Concern Present (07/09/2022)   [employer]ational [employer] - Occupational Stress Questionnaire    Feeling of Stress : To some extent  Social Connections: Moderately Integrated (07/09/2022)   Social  Connection and Isolation Panel [NHANES]    Frequency of Communication with Friends and Family: More than three times a week    Frequency of Social Gatherings with Friends and Family: Once a week    Attends Religious Services: 1 to 4 times per year    Active Member of Golden West FinancialClubs or Organizations: No    Attends Engineer, structuralClub or Organization Meetings: Not on file    Marital Status: Married  Catering managerntimate Partner Violence: Not on file    Outpatient Medications Prior to Visit  Medication Sig Dispense Refill   citalopram (CELEXA) 20 MG tablet Take 1 tablet (20 mg total) by mouth daily. 90 tablet 0   ranitidine (ZANTAC) 75 MG tablet Take 75 mg by mouth daily at 6 (six) AM.      telmisartan (MICARDIS) 80 MG tablet Take 1 tablet (80 mg total) by mouth daily. 90 tablet 0   buPROPion (WELLBUTRIN XL) 300 MG 24 hr tablet Take 1 tablet (300 mg total) by mouth daily. 90 tablet 0   Cholecalciferol (VITAMIN D3) 1.25 MG (50000 UT) CAPS Take 1 capsule by mouth every 3 (three) days. 10 capsule 0   phentermine 30 MG capsule Take 1 capsule (30 mg total) by mouth every morning. 30 capsule 0   topiramate (TOPAMAX) 50 MG tablet Take 1 tablet (50 mg total) by mouth 2 (two) times daily. 60 tablet 0   No facility-administered medications prior to visit.    No Known Allergies  Review of Systems  Musculoskeletal:        (+)left heel pain (+)left ankle pain       Objective:    Physical Exam Constitutional:      General: She is not in acute distress.    Appearance: Normal appearance. She is not ill-appearing.  HENT:     Head: Normocephalic and atraumatic.     Right Ear: External ear normal.     Left Ear: External ear normal.  Eyes:     Extraocular Movements: Extraocular movements intact.     Pupils: Pupils are equal, round, and reactive to light.  Cardiovascular:     Rate and Rhythm: Normal rate and regular rhythm.     Heart sounds: Normal heart sounds. No murmur heard.    No gallop.  Pulmonary:     Effort: Pulmonary  effort is normal. No respiratory distress.     Breath sounds: Normal breath sounds. No wheezing or rales.  Skin:    General: Skin is warm and dry.  Neurological:     Mental Status: She is alert and oriented to person, place, and time.  Psychiatric:        Judgment: Judgment normal.     BP 138/80 (BP Location: Left Arm, Patient Position: Sitting, Cuff Size: Large)   Pulse 71   Temp 97.7 F (36.5 C) (Oral)   Resp 18   Ht 5\' 4"  (1.626 m)   Wt 221 lb 12.8 oz (100.6 kg)   SpO2 99%   BMI 38.07 kg/m  Wt Readings from Last 3 Encounters:  07/09/22 221 lb 12.8 oz (100.6 kg)  05/19/22 222 lb 9.6 oz (101 kg)  03/17/22 221 lb (100.2 kg)       Assessment & Plan:  Plantar fasciitis Assessment & Plan: Aleve, ice Stretching , no flip flops No flats  Get better shoes  Podiatry if no better  May be achilles tendonitis / and or spur as well      I, Donato Schultz, DO, personally preformed the services described in this documentation.  All medical record entries made by the scribe were at my direction and in my presence.  I have reviewed the chart and discharge instructions (if applicable) and agree that the record reflects my personal performance and is accurate and complete. 07/09/2022   I,Shehryar Baig,acting as a scribe for Donato Schultz, DO.,have documented all relevant documentation on the behalf of Donato Schultz, DO,as directed by  Donato Schultz, DO while in the presence of Donato Schultz, DO.   Donato Schultz, DO

## 2022-07-09 NOTE — Assessment & Plan Note (Addendum)
Aleve, ice Stretching , no flip flops No flats  Get better shoes  Podiatry if no better  May be achilles tendonitis / and or spur as well

## 2022-07-09 NOTE — Patient Instructions (Signed)
Plantar Fasciitis  Plantar fasciitis is a painful foot condition that affects the heel. It occurs when the band of tissue that connects the toes to the heel bone (plantar fascia) becomes irritated. This can happen as the result of exercising too much or doing other repetitive activities (overuse injury). Plantar fasciitis can cause mild irritation to severe pain that makes it difficult to walk or move. The pain is usually worse in the morning after sleeping, or after sitting or lying down for a period of time. Pain may also be worse after long periods of walking or standing. What are the causes? This condition may be caused by: Standing for long periods of time. Wearing shoes that do not have good arch support. Doing activities that put stress on joints (high-impact activities). This includes ballet and exercise that makes your heart beat faster (aerobic exercise), such as running. Being overweight. An abnormal way of walking (gait). Tight muscles in the back of your lower leg (calf). High arches in your feet or flat feet. Starting a new athletic activity. What are the signs or symptoms? The main symptom of this condition is heel pain. Pain may get worse after the following: Taking the first steps after a time of rest, especially in the morning after awakening, or after you have been sitting or lying down for a while. Long periods of standing still. Pain may decrease after 30-45 minutes of activity, such as gentle walking. How is this diagnosed? This condition may be diagnosed based on your medical history, a physical exam, and your symptoms. Your health care provider will check for: A tender area on the bottom of your foot. A high arch in your foot or flat feet. Pain when you move your foot. Difficulty moving your foot. You may have imaging tests to confirm the diagnosis, such as: X-rays. Ultrasound. MRI. How is this treated? Treatment for plantar fasciitis depends on how severe your  condition is. Treatment may include: Rest, ice, pressure (compression), and raising (elevating) the affected foot. This is called RICE therapy. Your health care provider may recommend RICE therapy along with over-the-counter pain medicines to manage your pain. Exercises to stretch your calves and your plantar fascia. A splint that holds your foot in a stretched, upward position while you sleep (night splint). Physical therapy to relieve symptoms and prevent problems in the future. Injections of steroid medicine (cortisone) to relieve pain and inflammation. Stimulating your plantar fascia with electrical impulses (extracorporeal shock wave therapy). This is usually the last treatment option before surgery. Surgery, if other treatments have not worked after 12 months. Follow these instructions at home: Managing pain, stiffness, and swelling  If directed, put ice on the painful area. To do this: Put ice in a plastic bag, or use a frozen bottle of water. Place a towel between your skin and the bag or bottle. Roll the bottom of your foot over the bag or bottle. Do this for 20 minutes, 2-3 times a day. Wear athletic shoes that have air-sole or gel-sole cushions, or try soft shoe inserts that are designed for plantar fasciitis. Elevate your foot above the level of your heart while you are sitting or lying down. Activity Avoid activities that cause pain. Ask your health care provider what activities are safe for you. Do physical therapy exercises and stretches as told by your health care provider. Try activities and forms of exercise that are easier on your joints (low impact). Examples include swimming, water aerobics, and biking. General instructions Take over-the-counter   and prescription medicines only as told by your health care provider. Wear a night splint while sleeping, if told by your health care provider. Loosen the splint if your toes tingle, become numb, or turn cold and blue. Maintain a  healthy weight, or work with your health care provider to lose weight as needed. Keep all follow-up visits. This is important. Contact a health care provider if you have: Symptoms that do not go away with home treatment. Pain that gets worse. Pain that affects your ability to move or do daily activities. Summary Plantar fasciitis is a painful foot condition that affects the heel. It occurs when the band of tissue that connects the toes to the heel bone (plantar fascia) becomes irritated. Heel pain is the main symptom of this condition. It may get worse after exercising too much or standing still for a long time. Treatment varies, but it usually starts with rest, ice, pressure (compression), and raising (elevating) the affected foot. This is called RICE therapy. Over-the-counter medicines can also be used to manage pain. This information is not intended to replace advice given to you by your health care provider. Make sure you discuss any questions you have with your health care provider. Document Revised: 07/03/2019 Document Reviewed: 07/03/2019 Elsevier Patient Education  2023 Elsevier Inc.  

## 2023-01-21 ENCOUNTER — Encounter: Payer: Self-pay | Admitting: Family Medicine

## 2023-01-21 ENCOUNTER — Ambulatory Visit (INDEPENDENT_AMBULATORY_CARE_PROVIDER_SITE_OTHER): Payer: No Typology Code available for payment source | Admitting: Family Medicine

## 2023-01-21 VITALS — BP 130/80 | HR 62 | Temp 97.6°F | Resp 18 | Ht 64.0 in | Wt 228.0 lb

## 2023-01-21 DIAGNOSIS — I10 Essential (primary) hypertension: Secondary | ICD-10-CM | POA: Diagnosis not present

## 2023-01-21 DIAGNOSIS — E7849 Other hyperlipidemia: Secondary | ICD-10-CM | POA: Diagnosis not present

## 2023-01-21 DIAGNOSIS — Z0000 Encounter for general adult medical examination without abnormal findings: Secondary | ICD-10-CM | POA: Diagnosis not present

## 2023-01-21 DIAGNOSIS — E88819 Insulin resistance, unspecified: Secondary | ICD-10-CM | POA: Diagnosis not present

## 2023-01-21 DIAGNOSIS — Z23 Encounter for immunization: Secondary | ICD-10-CM | POA: Diagnosis not present

## 2023-01-21 DIAGNOSIS — E66812 Obesity, class 2: Secondary | ICD-10-CM

## 2023-01-21 DIAGNOSIS — Z1159 Encounter for screening for other viral diseases: Secondary | ICD-10-CM

## 2023-01-21 DIAGNOSIS — Z6839 Body mass index (BMI) 39.0-39.9, adult: Secondary | ICD-10-CM

## 2023-01-21 NOTE — Assessment & Plan Note (Signed)
CON'T WITH DIET AND EXERCISE

## 2023-01-21 NOTE — Assessment & Plan Note (Signed)
[facility] UTD CHECK LABS  SEE [facility] Health Maintenance  Topic Date Due   COVID-19 Vaccine (1) Never done   Hepatitis C Screening  Never done   INFLUENZA VACCINE  10/29/2022   HIV Screening  07/13/2023 (Originally 04/17/1981)   MAMMOGRAM  05/28/2023   DTaP/Tdap/Td (2 - Td or Tdap) 07/13/2027   Colonoscopy  09/07/2027   Zoster Vaccines- Shingrix  Completed   HPV VACCINES  Aged Out

## 2023-01-21 NOTE — Progress Notes (Signed)
Established Patient Office Visit  Subjective   Patient ID: Theresa Mathews, female    DOB: 1966-01-29  Age: 57 y.o. MRN: [id number]  Chief Complaint  Patient presents with   Annual Exam    Pt states fasting     HPI Discussed the use of AI scribe software for clinical note transcription with the patient, who gave verbal consent to proceed.  History of Present Illness   The patient, with a history of hypertension and depression, presents for a physical exam and medication refills. She has not been taking her blood pressure medication or antidepressant for a few months and reports feeling well. She monitors her blood pressure at home, which typically ranges from 130-150/80s. She has been using a CPAP machine for an unspecified condition.  The patient was previously attending a weight loss program but stopped due to logistical challenges and lack of significant weight loss. She has gained back the weight she initially lost. She has incorporated some dietary changes learned from the program but struggles with meal planning.  The patient also reports plantar fasciitis, which has limited her ability to walk for exercise. She has recently started taking short walks again and also attends yoga classes.  The patient's mother has recently been diagnosed with congestive heart failure.     Past Medical History:  Diagnosis Date   Anxiety    Back pain    Depression    Fatigue    GERD (gastroesophageal reflux disease)    Lactose intolerance      Review of Systems  Constitutional:  Negative for chills, fever and malaise/fatigue.  HENT:  Negative for congestion and hearing loss.   Eyes:  Negative for blurred vision and discharge.  Respiratory:  Negative for cough, sputum production and shortness of breath.   Cardiovascular:  Negative for chest pain, palpitations and leg swelling.  Gastrointestinal:  Negative for abdominal pain, blood in stool, constipation, diarrhea, heartburn, nausea and  vomiting.  Genitourinary:  Negative for dysuria, frequency, hematuria and urgency.  Musculoskeletal:  Negative for back pain, falls and myalgias.  Skin:  Negative for rash.  Neurological:  Negative for dizziness, sensory change, loss of consciousness, weakness and headaches.  Endo/Heme/Allergies:  Negative for environmental allergies. Does not bruise/bleed easily.  Psychiatric/Behavioral:  Negative for depression and suicidal ideas. The patient is not nervous/anxious and does not have insomnia.       Objective:     BP 130/80 (BP Location: Left Arm, Patient Position: Sitting, Cuff Size: Large)   Pulse 62   Temp 97.6 F (36.4 C) (Oral)   Resp 18   Ht 5\' 4"  (1.626 m)   Wt 228 lb (103.4 kg)   SpO2 97%   BMI 39.14 kg/m  BP Readings from Last 3 Encounters:  01/21/23 130/80  07/09/22 138/80  05/19/22 137/74   Wt Readings from Last 3 Encounters:  01/21/23 228 lb (103.4 kg)  07/09/22 221 lb 12.8 oz (100.6 kg)  05/19/22 222 lb 9.6 oz (101 kg)   SpO2 Readings from Last 3 Encounters:  01/21/23 97%  07/09/22 99%  03/17/22 97%      Physical Exam Vitals and nursing note reviewed.  Constitutional:      General: She is not in acute distress.    Appearance: Normal appearance. She is well-developed.  HENT:     Head: Normocephalic and atraumatic.     Right Ear: Tympanic membrane, ear canal and external ear normal. There is no impacted cerumen.     Left  Ear: Tympanic membrane, ear canal and external ear normal. There is no impacted cerumen.     Nose: Nose normal.     Mouth/Throat:     Mouth: Mucous membranes are moist.     Pharynx: Oropharynx is clear. No oropharyngeal exudate or posterior oropharyngeal erythema.  Eyes:     General: No scleral icterus.       Right eye: No discharge.        Left eye: No discharge.     Conjunctiva/sclera: Conjunctivae normal.     Pupils: Pupils are equal, round, and reactive to light.  Neck:     Thyroid: No thyromegaly or thyroid tenderness.      Vascular: No JVD.  Cardiovascular:     Rate and Rhythm: Normal rate and regular rhythm.     Heart sounds: Normal heart sounds. No murmur heard. Pulmonary:     Effort: Pulmonary effort is normal. No respiratory distress.     Breath sounds: Normal breath sounds.  Abdominal:     General: Bowel sounds are normal. There is no distension.     Palpations: Abdomen is soft. There is no mass.     Tenderness: There is no abdominal tenderness. There is no guarding or rebound.  Genitourinary:    Vagina: Normal.  Musculoskeletal:        General: Normal range of motion.     Cervical back: Normal range of motion and neck supple.     Right lower leg: No edema.     Left lower leg: No edema.  Lymphadenopathy:     Cervical: No cervical adenopathy.  Skin:    General: Skin is warm and dry.     Findings: No erythema or rash.  Neurological:     Mental Status: She is alert and oriented to person, place, and time.     Cranial Nerves: No cranial nerve deficit.     Deep Tendon Reflexes: Reflexes are normal and symmetric.  Psychiatric:        Mood and Affect: Mood normal.        Behavior: Behavior normal.        Thought Content: Thought content normal.        Judgment: Judgment normal.      No results found for any visits on 01/21/23.  Last CBC Lab Results  Component Value Date   WBC 8.5 07/12/2017   HGB 13.7 07/12/2017   HCT 39.8 07/12/2017   MCV 88.9 07/12/2017   MCH 31.0 01/08/2015   RDW 13.7 07/12/2017   PLT 383.0 07/12/2017   Last metabolic panel Lab Results  Component Value Date   GLUCOSE 81 12/25/2021   NA 140 12/25/2021   K 4.5 12/25/2021   CL 102 12/25/2021   CO2 24 12/25/2021   BUN 20 12/25/2021   CREATININE 0.81 12/25/2021   EGFR 86 12/25/2021   CALCIUM 9.2 12/25/2021   PROT 6.8 12/25/2021   ALBUMIN 4.3 12/25/2021   LABGLOB 2.5 12/25/2021   AGRATIO 1.7 12/25/2021   BILITOT 0.3 12/25/2021   ALKPHOS 96 12/25/2021   AST 18 12/25/2021   ALT 17 12/25/2021   Last  lipids Lab Results  Component Value Date   CHOL 221 (H) 12/25/2021   HDL 52 12/25/2021   LDLCALC 128 (H) 12/25/2021   TRIG 235 (H) 12/25/2021   CHOLHDL 3.9 07/09/2021   Last hemoglobin A1c Lab Results  Component Value Date   HGBA1C 5.2 12/25/2021   Last thyroid functions Lab Results  Component Value Date  TSH 1.290 08/26/2017   T3TOTAL 111 08/26/2017   Last vitamin D Lab Results  Component Value Date   VD25OH 60.4 12/25/2021   Last vitamin B12 and Folate Lab Results  Component Value Date   VITAMINB12 536 02/02/2019   FOLATE 13.2 08/26/2017      The 10-year ASCVD risk score (Arnett DK, et al., 2019) is: 3.5%    Assessment & Plan:   Problem List Items Addressed This Visit       Unprioritized   Insulin resistance   Relevant Orders   Comprehensive metabolic panel   VITAMIN D 25 Hydroxy (Vit-D Deficiency, Fractures)   Insulin, random   Preventative health care - Primary   Relevant Orders   CBC with Differential/Platelet   Comprehensive metabolic panel   Lipid panel   TSH   Class 2 severe obesity with serious comorbidity and body mass index (BMI) of 37.0 to 37.9 in adult [facility])   Relevant Orders   VITAMIN D 25 Hydroxy (Vit-D Deficiency, Fractures)   Insulin, random   Other hyperlipidemia    Encourage heart healthy diet such as MIND or DASH diet, increase exercise, avoid trans fats, simple carbohydrates and processed foods, consider a krill or fish or flaxseed oil cap daily.        Relevant Orders   Comprehensive metabolic panel   Lipid panel   Morbid obesity with body mass index of 40.0-44.9 in adult (HCC)    CON'T WITH DIET AND EXERCISE      Essential hypertension    Well controlled, no changes to meds. Encouraged heart healthy diet such as the DASH diet and exercise as tolerated.        Relevant Orders   CBC with Differential/Platelet   Comprehensive metabolic panel   Lipid panel   TSH   Other Visit Diagnoses     Need for influenza  vaccination       Relevant Orders   Flu vaccine trivalent PF, 6mos and older(Flulaval,Afluria,Fluarix,Fluzone)   Need for hepatitis C screening test       Relevant Orders   Hepatitis C antibody     Assessment and Plan    Hypertension Patient has been off medication for a few months with home readings ranging from 130-150 systolic and low to mid 80s diastolic. Today's reading is borderline. Patient has been monitoring at home. -Advise patient to continue monitoring blood pressure at home. -If diastolic readings frequently reach 90s or systolic readings frequently reach 140s-150s, consider restarting medication. -Advise patient to limit salt intake. -Plan to recheck in 3 months.  Depression Patient has been off antidepressant for a while and reports feeling well. -No plan to restart antidepressant at this time.  Obesity Patient has tried various weight loss strategies including medication and a weight loss program but has not seen significant results. Patient has gained back weight lost during previous attempts. -Encourage patient to continue implementing learned healthy eating habits and meal planning. -Consider further weight loss strategies if patient is interested.  General Health Maintenance -Administer influenza vaccine today. -Schedule follow-up visit in 3 months or sooner if needed for blood pressure management.        No follow-ups on file.    Donato Schultz, DO

## 2023-01-21 NOTE — Assessment & Plan Note (Signed)
Well controlled, no changes to meds. Encouraged heart healthy diet such as the DASH diet and exercise as tolerated.  °

## 2023-01-21 NOTE — Assessment & Plan Note (Signed)
Encourage heart healthy diet such as MIND or DASH diet, increase exercise, avoid trans fats, simple carbohydrates and processed foods, consider a krill or fish or flaxseed oil cap daily.  °

## 2023-01-22 LAB — CBC WITH DIFFERENTIAL/PLATELET
Basophils Absolute: 0.1 10*3/uL (ref 0.0–0.1)
Basophils Relative: 1 % (ref 0.0–3.0)
Eosinophils Absolute: 0.3 10*3/uL (ref 0.0–0.7)
Eosinophils Relative: 3.8 % (ref 0.0–5.0)
HCT: 40.2 % (ref 36.0–46.0)
Hemoglobin: 13.2 g/dL (ref 12.0–15.0)
Lymphocytes Relative: 24.3 % (ref 12.0–46.0)
Lymphs Abs: 1.8 10*3/uL (ref 0.7–4.0)
MCHC: 32.8 g/dL (ref 30.0–36.0)
MCV: 93.3 fL (ref 78.0–100.0)
Monocytes Absolute: 0.4 10*3/uL (ref 0.1–1.0)
Monocytes Relative: 5.2 % (ref 3.0–12.0)
Neutro Abs: 5 10*3/uL (ref 1.4–7.7)
Neutrophils Relative %: 65.7 % (ref 43.0–77.0)
Platelets: 353 10*3/uL (ref 150.0–400.0)
RBC: 4.31 Mil/uL (ref 3.87–5.11)
RDW: 13.6 % (ref 11.5–15.5)
WBC: 7.6 10*3/uL (ref 4.0–10.5)

## 2023-01-22 LAB — LIPID PANEL
Cholesterol: 221 mg/dL — ABNORMAL HIGH (ref 0–200)
HDL: 61.3 mg/dL (ref >39.00)
LDL Cholesterol: 127 mg/dL — ABNORMAL HIGH (ref 0–99)
NonHDL: 159.32
Total CHOL/HDL Ratio: 4
Triglycerides: 161 mg/dL — ABNORMAL HIGH (ref 0.0–149.0)
VLDL: 32.2 mg/dL (ref 0.0–40.0)

## 2023-01-22 LAB — COMPREHENSIVE METABOLIC PANEL
ALT: 16 U/L (ref 0–35)
AST: 18 U/L (ref 0–37)
Albumin: 4.2 g/dL (ref 3.5–5.2)
Alkaline Phosphatase: 95 U/L (ref 39–117)
BUN: 13 mg/dL (ref 6–23)
CO2: 29 meq/L (ref 19–32)
Calcium: 9.4 mg/dL (ref 8.4–10.5)
Chloride: 101 meq/L (ref 96–112)
Creatinine, Ser: 0.74 mg/dL (ref 0.40–1.20)
GFR: 90.23 mL/min (ref >60.00)
Glucose, Bld: 94 mg/dL (ref 70–99)
Potassium: 4.3 meq/L (ref 3.5–5.1)
Sodium: 139 meq/L (ref 135–145)
Total Bilirubin: 0.7 mg/dL (ref 0.2–1.2)
Total Protein: 7 g/dL (ref 6.0–8.3)

## 2023-01-22 LAB — INSULIN, RANDOM: Insulin: 4.9 u[IU]/mL

## 2023-01-22 LAB — TSH: TSH: 1.27 u[IU]/mL (ref 0.35–5.50)

## 2023-01-22 LAB — VITAMIN D 25 HYDROXY (VIT D DEFICIENCY, FRACTURES): VITD: 32.81 ng/mL (ref 30.00–100.00)

## 2023-01-22 LAB — HEPATITIS C ANTIBODY: Hepatitis C Ab: NONREACTIVE

## 2023-03-04 ENCOUNTER — Ambulatory Visit: Payer: No Typology Code available for payment source | Admitting: Family Medicine

## 2023-03-04 VITALS — BP 130/90 | HR 83 | Temp 98.7°F | Resp 18 | Ht 64.0 in | Wt 224.2 lb

## 2023-03-04 DIAGNOSIS — R051 Acute cough: Secondary | ICD-10-CM | POA: Diagnosis not present

## 2023-03-04 DIAGNOSIS — J0140 Acute pansinusitis, unspecified: Secondary | ICD-10-CM | POA: Diagnosis not present

## 2023-03-04 LAB — POC COVID19 BINAXNOW: SARS Coronavirus 2 Ag: NEGATIVE

## 2023-03-04 MED ORDER — AMOXICILLIN-POT CLAVULANATE 875-125 MG PO TABS: 1.0000 | ORAL_TABLET | Freq: Two times a day (BID) | ORAL | 0 refills | Status: DC

## 2023-03-04 MED ORDER — FLUTICASONE PROPIONATE 50 MCG/ACT NA SUSP: 2.0000 | Freq: Every day | NASAL | 6 refills | Status: DC

## 2023-03-04 MED ORDER — PROMETHAZINE-DM 6.25-15 MG/5ML PO SYRP: 5.0000 mL | ORAL_SOLUTION | Freq: Four times a day (QID) | ORAL | 0 refills | Status: DC | PRN

## 2023-03-04 NOTE — Progress Notes (Signed)
Established Patient Office Visit  Subjective   Patient ID: Theresa Mathews, female    DOB: 1966-02-12  Age: 57 y.o. MRN: [id number]  Chief Complaint  Patient presents with   Cough    sXs Saturday evening, no COVID test, dry cough, scratchy throat.     HPI Pt presents today with dry cough, sinus congestion and sore throat since Sat.  No covid test.  She was taking coricidin Hbp with no relief.  No wheezing , no fever.  Patient Active Problem List   Diagnosis Date Noted   Acute cough 03/04/2023   Acute non-recurrent pansinusitis 03/04/2023   Plantar fasciitis 07/09/2022   Eating disorder 12/25/2021   Polyphagia 11/24/2021   OSA on CPAP 04/01/2021   Preventative health care 09/16/2020   Bilateral chronic knee pain 02/29/2020   Depression 10/25/2019   Other hyperlipidemia 09/22/2018   Exposure to COVID-19 virus 08/16/2018   Diarrhea 08/16/2018   Fever and chills 08/16/2018   Vitamin D deficiency 04/28/2018   Insulin resistance 04/14/2018   Essential hypertension 03/15/2018   Class 2 severe obesity with serious comorbidity and body mass index (BMI) of 37.0 to 37.9 in adult [facility]) 01/18/2018   Other fatigue 08/26/2017   Shortness of breath on exertion 08/26/2017   Gastroesophageal reflux disease without esophagitis 08/26/2017   Depression with anxiety 04/12/2017   Morbid obesity with body mass index of 40.0-44.9 in adult [facility]) 04/12/2017   Postoperative state 01/23/2015   Low back pain 12/01/2012   Past Medical History:  Diagnosis Date   Anxiety    Back pain    Depression    Fatigue    GERD (gastroesophageal reflux disease)    Lactose intolerance    Past Surgical History:  Procedure Laterality Date   BLADDER SUSPENSION N/A 01/23/2015   Procedure: TRANSVAGINAL TAPE (TVT) PROCEDURE;  Surgeon: Carrington Clamp, MD;  Location: [facility];  Service: Gynecology;  Laterality: N/A;   CYSTOSCOPY N/A 01/23/2015   Procedure: CYSTOSCOPY;  Surgeon: Carrington Clamp, MD;  Location: [facility]  [facility];  Service: Gynecology;  Laterality: N/A;   DILATION AND CURETTAGE OF UTERUS  2005   ROBOTIC ASSISTED TOTAL HYSTERECTOMY WITH BILATERAL SALPINGO OOPHERECTOMY Bilateral 01/23/2015   Procedure: ROBOTIC ASSISTED TOTAL HYSTERECTOMY WITH BILATERAL SALPINGO OOPHORECTOMY;  Surgeon: Carrington Clamp, MD;  Location: [facility];  Service: Gynecology;  Laterality: Bilateral;   Social History   Tobacco Use   Smoking status: Former    Current packs/day: 0.00    Average packs/day: 0.5 packs/day for 4.0 years (2.0 ttl pk-yrs)    Types: Cigarettes    Start date: 69    Quit date: 10    Years since quitting: 30.9   Smokeless tobacco: Never   Tobacco comments:    smoked in college  Vaping Use   Vaping status: Never Used  Substance Use Topics   Alcohol use: Not Currently    Comment: occ   Drug use: No   Social History   Socioeconomic History   Marital status: Married    Spouse name: Irene Macbeth   Number of children: 2   Years of education: Not on file   Highest education level: Bachelor's degree (e.g., BA, AB, BS)  Occupational History   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    Comment: [employer]  Tobacco Use   Smoking status: Former    Current packs/day: 0.00    Average packs/day: 0.5 packs/day for 4.0 years (2.0 ttl pk-yrs)    Types: Cigarettes    Start date: 37  Quit date: 81    Years since quitting: 30.9   Smokeless tobacco: Never   Tobacco comments:    smoked in college  Vaping Use   Vaping status: Never Used  Substance and Sexual Activity   Alcohol use: Not Currently    Comment: occ   Drug use: No   Sexual activity: Yes    Partners: Male  Other Topics Concern   Not on file  Social History Narrative   Exercise- walks 4 times a week   Caffeine: 1-2 cups daily   Education: BS textile [employer]   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    Social Determinants of Health   Financial Resource Strain: Low Risk  (03/04/2023)   Overall Financial Resource Strain (CARDIA)     Difficulty of Paying Living Expenses: Not very hard  Food Insecurity: No Food Insecurity (03/04/2023)   Hunger Vital Sign    Worried About Running Out of Food in the Last Year: Never true    Ran Out of Food in the Last Year: Never true  Transportation Needs: No Transportation Needs (03/04/2023)   PRAPARE - Administrator, Civil Service (Medical): No    Lack of Transportation (Non-Medical): No  Physical Activity: Insufficiently Active (03/04/2023)   Exercise Vital Sign    Days of Exercise per Week: 2 days    Minutes of Exercise per Session: 20 min  Stress: No Stress Concern Present (03/04/2023)   [employer] of Occupational Health - Occupational Stress Questionnaire    Feeling of Stress : Only a little  Social Connections: Moderately Integrated (03/04/2023)   Social Connection and Isolation Panel [NHANES]    Frequency of Communication with Friends and Family: More than three times a week    Frequency of Social Gatherings with Friends and Family: Once a week    Attends Religious Services: 1 to 4 times per year    Active Member of [employer] or Organizations: No    Attends Engineer, structural: Not on file    Marital Status: Married  Catering manager Violence: Not on file   Family Status  Relation Name Status   Mother  Alive   Father  Alive   Brother  Alive   PGF  (Not Specified)   Neg Hx  (Not Specified)  No partnership data on file   Family History  Problem Relation Age of Onset   Hypertension Mother    Ovarian cancer Mother    Heart disease Mother 1       a fib   Obesity Mother    Heart failure Mother    Hypertension Father    Diabetes Father    Hyperlipidemia Father    Obesity Father    Asthma Paternal Grandfather    Sleep apnea Neg Hx    No Known Allergies    Review of Systems  Constitutional:  Negative for fever and malaise/fatigue.  HENT:  Positive for congestion, sinus pain and sore throat.   Eyes:  Negative for blurred vision.   Respiratory:  Positive for cough. Negative for sputum production, shortness of breath and wheezing.   Cardiovascular:  Negative for chest pain, palpitations and leg swelling.  Gastrointestinal:  Negative for vomiting.  Musculoskeletal:  Negative for back pain.  Skin:  Negative for rash.  Neurological:  Negative for loss of consciousness and headaches.      Objective:     BP (!) 130/90 (BP Location: Left Arm, Patient Position: Sitting, Cuff Size: Large)  Pulse 83   Temp 98.7 F (37.1 C) (Oral)   Resp 18   Ht 5\' 4"  (1.626 m)   Wt 224 lb 3.2 oz (101.7 kg)   SpO2 96%   BMI 38.48 kg/m  BP Readings from Last 3 Encounters:  03/04/23 (!) 130/90  01/21/23 130/80  07/09/22 138/80   Wt Readings from Last 3 Encounters:  03/04/23 224 lb 3.2 oz (101.7 kg)  01/21/23 228 lb (103.4 kg)  07/09/22 221 lb 12.8 oz (100.6 kg)   SpO2 Readings from Last 3 Encounters:  03/04/23 96%  01/21/23 97%  07/09/22 99%    Physical Exam Vitals and nursing note reviewed.  Constitutional:      General: She is not in acute distress.    Appearance: Normal appearance. She is well-developed.  HENT:     Head: Normocephalic and atraumatic.     Nose:     Right Sinus: Maxillary sinus tenderness and frontal sinus tenderness present.     Left Sinus: Maxillary sinus tenderness and frontal sinus tenderness present.  Eyes:     General: No scleral icterus.       Right eye: No discharge.        Left eye: No discharge.  Cardiovascular:     Rate and Rhythm: Normal rate and regular rhythm.     Heart sounds: No murmur heard. Pulmonary:     Effort: Pulmonary effort is normal. No respiratory distress.     Breath sounds: Normal breath sounds.  Musculoskeletal:        General: Normal range of motion.     Cervical back: Normal range of motion and neck supple.     Right lower leg: No edema.     Left lower leg: No edema.  Skin:    General: Skin is warm and dry.  Neurological:     Mental Status: She is alert and  oriented to person, place, and time.  Psychiatric:        Mood and Affect: Mood normal.        Behavior: Behavior normal.        Thought Content: Thought content normal.        Judgment: Judgment normal.      Results for orders placed or performed in visit on 03/04/23  POC COVID-19  Result Value Ref Range   SARS Coronavirus 2 Ag Negative Negative    Last CBC Lab Results  Component Value Date   WBC 7.6 01/21/2023   HGB 13.2 01/21/2023   HCT 40.2 01/21/2023   MCV 93.3 01/21/2023   MCH 31.0 01/08/2015   RDW 13.6 01/21/2023   PLT 353.0 01/21/2023   Last metabolic panel Lab Results  Component Value Date   GLUCOSE 94 01/21/2023   NA 139 01/21/2023   K 4.3 01/21/2023   CL 101 01/21/2023   CO2 29 01/21/2023   BUN 13 01/21/2023   CREATININE 0.74 01/21/2023   GFR 90.23 01/21/2023   CALCIUM 9.4 01/21/2023   PROT 7.0 01/21/2023   ALBUMIN 4.2 01/21/2023   LABGLOB 2.5 12/25/2021   AGRATIO 1.7 12/25/2021   BILITOT 0.7 01/21/2023   ALKPHOS 95 01/21/2023   AST 18 01/21/2023   ALT 16 01/21/2023   Last lipids Lab Results  Component Value Date   CHOL 221 (H) 01/21/2023   HDL 61.30 01/21/2023   LDLCALC 127 (H) 01/21/2023   TRIG 161.0 (H) 01/21/2023   CHOLHDL 4 01/21/2023   Last hemoglobin A1c Lab Results  Component Value Date  HGBA1C 5.2 12/25/2021   Last thyroid functions Lab Results  Component Value Date   TSH 1.27 01/21/2023   T3TOTAL 111 08/26/2017   Last vitamin D Lab Results  Component Value Date   VD25OH 32.81 01/21/2023   Last vitamin B12 and Folate Lab Results  Component Value Date   VITAMINB12 536 02/02/2019   FOLATE 13.2 08/26/2017      The 10-year ASCVD risk score (Arnett DK, et al., 2019) is: 2.3%    Assessment & Plan:   Problem List Items Addressed This Visit       Unprioritized   Acute non-recurrent pansinusitis    Augmentin for 10 days Flonase  Cough med Covid negative Return to office as needed       Relevant Medications    promethazine-dextromethorphan (PROMETHAZINE-DM) 6.25-15 MG/5ML syrup   amoxicillin-clavulanate (AUGMENTIN) 875-125 MG tablet   fluticasone (FLONASE) 50 MCG/ACT nasal spray   Acute cough - Primary   Relevant Medications   promethazine-dextromethorphan (PROMETHAZINE-DM) 6.25-15 MG/5ML syrup   Other Relevant Orders   POC COVID-19 (Completed)  Assessment and Plan          ]  No follow-ups on file.    Donato Schultz, DO

## 2023-03-04 NOTE — Assessment & Plan Note (Signed)
Augmentin for 10 days Flonase  Cough med Covid negative Return to office as needed

## 2023-04-20 ENCOUNTER — Ambulatory Visit: Payer: No Typology Code available for payment source | Admitting: Family Medicine

## 2023-04-20 ENCOUNTER — Encounter: Payer: Self-pay | Admitting: Family Medicine

## 2023-04-20 VITALS — BP 128/70 | HR 67 | Temp 98.6°F | Resp 18 | Ht 64.0 in | Wt 229.8 lb

## 2023-04-20 DIAGNOSIS — D229 Melanocytic nevi, unspecified: Secondary | ICD-10-CM | POA: Diagnosis not present

## 2023-04-20 DIAGNOSIS — G4733 Obstructive sleep apnea (adult) (pediatric): Secondary | ICD-10-CM

## 2023-04-20 MED ORDER — FAMOTIDINE 10 MG PO TABS: 10.0000 mg | ORAL_TABLET | Freq: Two times a day (BID) | ORAL | Status: AC

## 2023-04-20 NOTE — Patient Instructions (Signed)

## 2023-04-20 NOTE — Progress Notes (Signed)
Established Patient Office Visit  Subjective   Patient ID: Theresa Mathews, female    DOB: March 12, 1966  Age: 58 y.o. MRN: [id number]  Chief Complaint  Patient presents with   Hypertension   Follow-up    HPI Discussed the use of AI scribe software for clinical note transcription with the patient, who gave verbal consent to proceed.  History of Present Illness   The patient, with a history of sleep apnea and dermatological concerns, presents for routine follow-up and referral renewals. She has been seeing a dermatologist for annual skin checks due to a history of atypical skin lesions, but no skin cancers. She has an upcoming appointment scheduled in March. She also has been seeing a neurologist for management of sleep apnea, with a follow-up appointment already scheduled.  The patient is currently on famotidine for occasional use, which she reports has been effective. She notes that symptoms of reflux tend to worsen with weight gain. She has not reported any new symptoms or changes in her health status.  The patient's spouse, who has MS, recently contracted pneumonia. The patient expressed concern about the contagiousness of the condition, but does not report any symptoms of respiratory illness. She also discussed her cholesterol levels, which are higher than desired. The patient is aware of the need for dietary changes and exercise to manage this. She plans to have her cholesterol rechecked in six months.      Patient Active Problem List   Diagnosis Date Noted   Acute cough 03/04/2023   Acute non-recurrent pansinusitis 03/04/2023   Plantar fasciitis 07/09/2022   Eating disorder 12/25/2021   Polyphagia 11/24/2021   OSA on CPAP 04/01/2021   Preventative health care 09/16/2020   Bilateral chronic knee pain 02/29/2020   Depression 10/25/2019   Other hyperlipidemia 09/22/2018   Exposure to COVID-19 virus 08/16/2018   Diarrhea 08/16/2018   Fever and chills 08/16/2018   Vitamin D  deficiency 04/28/2018   Insulin resistance 04/14/2018   Essential hypertension 03/15/2018   Class 2 severe obesity with serious comorbidity and body mass index (BMI) of 37.0 to 37.9 in adult [facility]) 01/18/2018   Other fatigue 08/26/2017   Shortness of breath on exertion 08/26/2017   Gastroesophageal reflux disease without esophagitis 08/26/2017   Depression with anxiety 04/12/2017   Morbid obesity with body mass index of 40.0-44.9 in adult [facility]) 04/12/2017   Postoperative state 01/23/2015   Low back pain 12/01/2012   Past Medical History:  Diagnosis Date   Anxiety    Back pain    Depression    Fatigue    GERD (gastroesophageal reflux disease)    Lactose intolerance    Past Surgical History:  Procedure Laterality Date   BLADDER SUSPENSION N/A 01/23/2015   Procedure: TRANSVAGINAL TAPE (TVT) PROCEDURE;  Surgeon: Carrington Clamp, MD;  Location: [facility];  Service: Gynecology;  Laterality: N/A;   CYSTOSCOPY N/A 01/23/2015   Procedure: CYSTOSCOPY;  Surgeon: Carrington Clamp, MD;  Location: [facility];  Service: Gynecology;  Laterality: N/A;   DILATION AND CURETTAGE OF UTERUS  2005   ROBOTIC ASSISTED TOTAL HYSTERECTOMY WITH BILATERAL SALPINGO OOPHERECTOMY Bilateral 01/23/2015   Procedure: ROBOTIC ASSISTED TOTAL HYSTERECTOMY WITH BILATERAL SALPINGO OOPHORECTOMY;  Surgeon: Carrington Clamp, MD;  Location: [facility];  Service: Gynecology;  Laterality: Bilateral;   Social History   Tobacco Use   Smoking status: Former    Current packs/day: 0.00    Average packs/day: 0.5 packs/day for 4.0 years (2.0 ttl pk-yrs)    Types: Cigarettes  Start date: 32    Quit date: 25    Years since quitting: 31.0   Smokeless tobacco: Never   Tobacco comments:    smoked in college  Vaping Use   Vaping status: Never Used  Substance Use Topics   Alcohol use: Not Currently    Comment: occ   Drug use: No   Social History   Socioeconomic History   Marital status: Married    Spouse name: Morgyn Melucci    Number of children: 2   Years of education: Not on file   Highest education level: Bachelor's degree (e.g., BA, AB, BS)  Occupational History   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    Comment: [employer]  Tobacco Use   Smoking status: Former    Current packs/day: 0.00    Average packs/day: 0.5 packs/day for 4.0 years (2.0 ttl pk-yrs)    Types: Cigarettes    Start date: 31    Quit date: 47    Years since quitting: 31.0   Smokeless tobacco: Never   Tobacco comments:    smoked in college  Vaping Use   Vaping status: Never Used  Substance and Sexual Activity   Alcohol use: Not Currently    Comment: occ   Drug use: No   Sexual activity: Yes    Partners: Male  Other Topics Concern   Not on file  Social History Narrative   Exercise- walks 4 times a week   Caffeine: 1-2 cups daily   Education: BS [employer]   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    Social Drivers of Corporate investment banker Strain: Low Risk  (04/19/2023)   Overall Financial Resource Strain (CARDIA)    Difficulty of Paying Living Expenses: Not very hard  Food Insecurity: No Food Insecurity (04/19/2023)   Hunger Vital Sign    Worried About Running Out of Food in the Last Year: Never true    Ran Out of Food in the Last Year: Never true  Transportation Needs: No Transportation Needs (04/19/2023)   PRAPARE - Administrator, Civil Service (Medical): No    Lack of Transportation (Non-Medical): No  Physical Activity: Insufficiently Active (04/19/2023)   Exercise Vital Sign    Days of Exercise per Week: 2 days    Minutes of Exercise per Session: 20 min  Stress: Stress Concern Present (04/19/2023)   [employer] of Occupational Health - Occupational Stress Questionnaire    Feeling of Stress : To some extent  Social Connections: Moderately Integrated (04/19/2023)   Social Connection and Isolation Panel [NHANES]    Frequency of Communication with Friends and Family: More than three times a week     Frequency of Social Gatherings with Friends and Family: Once a week    Attends Religious Services: 1 to 4 times per year    Active Member of [employer] or Organizations: No    Attends Engineer, structural: Not on file    Marital Status: Married  Catering manager Violence: Not on file   Family Status  Relation Name Status   Mother  Alive   Father  Alive   Brother  Alive   PGF  (Not Specified)   Neg Hx  (Not Specified)  No partnership data on file   Family History  Problem Relation Age of Onset   Hypertension Mother    Ovarian cancer Mother    Heart disease Mother 87       a fib   Obesity Mother  Heart failure Mother    Hypertension Father    Diabetes Father    Hyperlipidemia Father    Obesity Father    Asthma Paternal Grandfather    Sleep apnea Neg Hx    No Known Allergies    Review of Systems  Constitutional:  Negative for fever.  HENT:  Negative for congestion.   Eyes:  Negative for blurred vision.  Respiratory:  Negative for cough.   Cardiovascular:  Negative for chest pain and palpitations.  Gastrointestinal:  Negative for vomiting.  Musculoskeletal:  Negative for back pain.  Skin:  Negative for rash.  Neurological:  Negative for loss of consciousness and headaches.      Objective:     BP 128/70 (BP Location: Left Arm, Patient Position: Sitting)   Pulse 67   Temp 98.6 F (37 C) (Oral)   Resp 18   Ht 5\' 4"  (1.626 m)   Wt 229 lb 12.8 oz (104.2 kg)   SpO2 99%   BMI 39.45 kg/m  BP Readings from Last 3 Encounters:  04/20/23 128/70  03/04/23 (!) 130/90  01/21/23 130/80   Wt Readings from Last 3 Encounters:  04/20/23 229 lb 12.8 oz (104.2 kg)  03/04/23 224 lb 3.2 oz (101.7 kg)  01/21/23 228 lb (103.4 kg)   SpO2 Readings from Last 3 Encounters:  04/20/23 99%  03/04/23 96%  01/21/23 97%      Physical Exam Vitals and nursing note reviewed.  Constitutional:      General: She is not in acute distress.    Appearance: Normal appearance.  She is well-developed.  HENT:     Head: Normocephalic and atraumatic.  Eyes:     General: No scleral icterus.       Right eye: No discharge.        Left eye: No discharge.  Cardiovascular:     Rate and Rhythm: Normal rate and regular rhythm.     Heart sounds: No murmur heard. Pulmonary:     Effort: Pulmonary effort is normal. No respiratory distress.     Breath sounds: Normal breath sounds.  Musculoskeletal:        General: Normal range of motion.     Cervical back: Normal range of motion and neck supple.     Right lower leg: No edema.     Left lower leg: No edema.  Skin:    General: Skin is warm and dry.  Neurological:     Mental Status: She is alert and oriented to person, place, and time.  Psychiatric:        Mood and Affect: Mood normal.        Behavior: Behavior normal.        Thought Content: Thought content normal.        Judgment: Judgment normal.      No results found for any visits on 04/20/23.  Last CBC Lab Results  Component Value Date   WBC 7.6 01/21/2023   HGB 13.2 01/21/2023   HCT 40.2 01/21/2023   MCV 93.3 01/21/2023   MCH 31.0 01/08/2015   RDW 13.6 01/21/2023   PLT 353.0 01/21/2023   Last metabolic panel Lab Results  Component Value Date   GLUCOSE 94 01/21/2023   NA 139 01/21/2023   K 4.3 01/21/2023   CL 101 01/21/2023   CO2 29 01/21/2023   BUN 13 01/21/2023   CREATININE 0.74 01/21/2023   GFR 90.23 01/21/2023   CALCIUM 9.4 01/21/2023   PROT 7.0 01/21/2023   ALBUMIN  4.2 01/21/2023   LABGLOB 2.5 12/25/2021   AGRATIO 1.7 12/25/2021   BILITOT 0.7 01/21/2023   ALKPHOS 95 01/21/2023   AST 18 01/21/2023   ALT 16 01/21/2023   Last lipids Lab Results  Component Value Date   CHOL 221 (H) 01/21/2023   HDL 61.30 01/21/2023   LDLCALC 127 (H) 01/21/2023   TRIG 161.0 (H) 01/21/2023   CHOLHDL 4 01/21/2023   Last hemoglobin A1c Lab Results  Component Value Date   HGBA1C 5.2 12/25/2021   Last thyroid functions Lab Results  Component  Value Date   TSH 1.27 01/21/2023   T3TOTAL 111 08/26/2017   Last vitamin D Lab Results  Component Value Date   VD25OH 32.81 01/21/2023   Last vitamin B12 and Folate Lab Results  Component Value Date   VITAMINB12 536 02/02/2019   FOLATE 13.2 08/26/2017      The 10-year ASCVD risk score (Arnett DK, et al., 2019) is: 2.4%    Assessment & Plan:   Problem List Items Addressed This Visit   None Visit Diagnoses       Atypical nevus    -  Primary   Relevant Orders   Ambulatory referral to Dermatology     OSA (obstructive sleep apnea)       Relevant Orders   Ambulatory referral to Neurology     Assessment and Plan    Referral for Neurology Follow-up with neurology is required for sleep apnea. Currently under the care of [facility] with an appointment scheduled. Submit referral for sleep apnea follow-up.  Referral for Dermatology An annual dermatology check for atypical lesions is needed. There is no history of skin cancer. An appointment is scheduled for March. Submit referral for the annual skin check.  Hypertension Blood pressure was elevated during a previous visit when ill. Monitor blood pressure, especially if feeling unwell, tired, or dizzy. Recheck blood pressure in six months.  Hyperlipidemia Cholesterol levels are elevated. Discussed weight loss, exercise, and dietary modifications to improve HDL levels. Explained HDL as 'healthy' cholesterol and LDL as 'bad' cholesterol. Recheck cholesterol levels in six months. Encourage weight loss and regular exercise. Recommend dietary modifications to reduce fatty foods and increase HDL.  General Health Maintenance Schedule a follow-up appointment in six months for blood pressure and cholesterol recheck.        Return in about 6 months (around 10/18/2023) for bp and lipid.    Donato Schultz, DO

## 2023-04-23 ENCOUNTER — Ambulatory Visit: Payer: No Typology Code available for payment source | Admitting: Family Medicine

## 2023-05-01 ENCOUNTER — Other Ambulatory Visit: Payer: Self-pay | Admitting: Family Medicine

## 2023-05-01 DIAGNOSIS — Z1231 Encounter for screening mammogram for malignant neoplasm of breast: Secondary | ICD-10-CM

## 2023-05-18 ENCOUNTER — Encounter: Payer: Self-pay | Admitting: Family Medicine

## 2023-05-18 DIAGNOSIS — Z1231 Encounter for screening mammogram for malignant neoplasm of breast: Secondary | ICD-10-CM

## 2023-05-25 ENCOUNTER — Encounter: Payer: Self-pay | Admitting: Adult Health

## 2023-05-25 ENCOUNTER — Ambulatory Visit: Payer: No Typology Code available for payment source | Admitting: Adult Health

## 2023-05-25 VITALS — BP 145/83 | HR 73 | Ht 64.0 in | Wt 229.0 lb

## 2023-05-25 DIAGNOSIS — G4733 Obstructive sleep apnea (adult) (pediatric): Secondary | ICD-10-CM

## 2023-05-25 NOTE — Progress Notes (Signed)
 PATIENT: Theresa Mathews DOB: 11-Nov-1965  REASON FOR VISIT: follow up HISTORY FROM: patient PRIMARY NEUROLOGIST: Dr. Frances Furbish  Chief Complaint  Patient presents with   Follow-up    Patient in room #18 and alone. Patient states while using her CPAP machine it makes her feel stuffy.     HISTORY OF PRESENT ILLNESS: Today 05/25/23:  Theresa Mathews is a 58 y.o. female with a history of OSA on CPAP. Returns today for follow-up.  She reports that her husband was sick with flu and pneumonia therefore she slept in a different room but did not move her CPAP.  She also states that she got sick and was unable to use the CPAP.  Which explains the gap in usage.  She does use the nasal pillows.  She states that sometimes causes her nose to feel stuffy but she does not wish to switch to a different mask.       05/19/22: Theresa Mathews is a 58 y.o. female with a history of OSA On CPAP. Returns today for follow-up.  She reports that when she is using the CPAP she Can tell a big difference in how she feels.  She states that there are some nights that she wakes up and she has taken the mask off unknowingly during the night.  She also reports that recently she has had trouble falling and staying asleep.  Denies any additional stress or changes in medication.  Her download indicates that she has her machine 26 out of 30 days for compliance of 86.7%.  On average she uses her machine 3 hours and 59 minutes.  She used her machine greater than 4 hours 15 out of 30 days for compliance of 50%.  Her residual AHI is 0.8 on 6 to 14 cm of water.  She returns today for an evaluation.   05/15/21: Theresa Mathews is a 58 year old female with a history of obstructive sleep apnea on CPAP.  She returns today for follow-up.  Her download indicates that she used her machine nightly for compliance of 100%.  She used her machine greater than 4 hours for compliance of 77%.  On average she uses her machine 5 and half hours most  nights.  Her residual AHI is 1 on 6 to 14 cm of water.  She reports that she has noticed the benefit with the CPAP.  She states that she feels like she is getting better sleep and more rested during the day.  Returns today for an evaluation.  HISTORY (copied from Dr. Teofilo Pod note)  Theresa Mathews is a 58 year old right-handed woman with an underlying medical history of reflux disease, lactose intolerance, insulin resistance, vitamin D deficiency, hypertension, hyperlipidemia and obesity, who reports snoring and excessive daytime somnolence.  She has woken up with a sense of gasping for air.  I reviewed your office note from 09/16/2020.  Her Epworth sleepiness score is 14 out of 24, fatigue severity score is 44 out of 63.  She has snored for years, her daytime somnolence has increased over time and her snoring has worsened especially with weight gain.  She has had trouble losing weight.  She follows with weight management.  She just recently started Mounjaro injections 8 weeks ago.  She has lost a little bit of weight thus far.  She has been on Wellbutrin but admits that she is not very good about taking day-to-day oral medications.  She goes to bed around 11 and rise time is between 630 and  7.  She has nocturia once per average night and has occasionally woken up with a headache in the morning, this is a dull, achy, mild frontal headache and sometimes she takes Excedrin.  She does not have a history of migraines.  Her mom snores and has a diagnosis of A. fib.  Patient reports feeling tired all the time and having lack of energy.  They have 1 dog in the household, she lives with her husband and youngest daughter.  She has 2 children altogether.  She works as an Environmental health practitioner for [employer].  Her commute is now only 10 minutes.  When she had a longer commute she does report feeling sleepy at the wheel and has dozed off in the past at the wheel, thankfully never had a car accident.  She drinks  caffeine in the form of coffee or tea, 1 or 2 cups in the mornings, occasional soda, not daily, drinks alcohol about 3 days out of the week and is a non-smoker.  She has no TV in the bedroom.   REVIEW OF SYSTEMS: Out of a complete 14 system review of symptoms, the patient complains only of the following symptoms, and all other reviewed systems are negative.   ESS 5  ALLERGIES: No Known Allergies  HOME MEDICATIONS: Outpatient Medications Prior to Visit  Medication Sig Dispense Refill   famotidine (ZANTAC 360) 10 MG tablet Take 1 tablet (10 mg total) by mouth 2 (two) times daily.     No facility-administered medications prior to visit.    PAST MEDICAL HISTORY: Past Medical History:  Diagnosis Date   Anxiety    Back pain    Depression    Fatigue    GERD (gastroesophageal reflux disease)    Lactose intolerance     PAST SURGICAL HISTORY: Past Surgical History:  Procedure Laterality Date   BLADDER SUSPENSION N/A 01/23/2015   Procedure: TRANSVAGINAL TAPE (TVT) PROCEDURE;  Surgeon: Carrington Clamp, MD;  Location: [facility];  Service: Gynecology;  Laterality: N/A;   CYSTOSCOPY N/A 01/23/2015   Procedure: CYSTOSCOPY;  Surgeon: Carrington Clamp, MD;  Location: [facility];  Service: Gynecology;  Laterality: N/A;   DILATION AND CURETTAGE OF UTERUS  2005   ROBOTIC ASSISTED TOTAL HYSTERECTOMY WITH BILATERAL SALPINGO OOPHERECTOMY Bilateral 01/23/2015   Procedure: ROBOTIC ASSISTED TOTAL HYSTERECTOMY WITH BILATERAL SALPINGO OOPHORECTOMY;  Surgeon: Carrington Clamp, MD;  Location: [facility];  Service: Gynecology;  Laterality: Bilateral;    FAMILY HISTORY: Family History  Problem Relation Age of Onset   Hypertension Mother    Ovarian cancer Mother    Heart disease Mother 28       a fib   Obesity Mother    Heart failure Mother    Hypertension Father    Diabetes Father    Hyperlipidemia Father    Obesity Father    Asthma Paternal Grandfather    Sleep apnea Neg Hx     SOCIAL  HISTORY: Social History   Socioeconomic History   Marital status: Married    Spouse name: Jeda Pardue   Number of children: 2   Years of education: Not on file   Highest education level: Bachelor's degree (e.g., BA, AB, BS)  Occupational History   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    Comment: [employer]  Tobacco Use   Smoking status: Former    Current packs/day: 0.00    Average packs/day: 0.5 packs/day for 4.0 years (2.0 ttl pk-yrs)    Types: Cigarettes    Start date: 54  Quit date: 61    Years since quitting: 31.1   Smokeless tobacco: Never   Tobacco comments:    smoked in college  Vaping Use   Vaping status: Never Used  Substance and Sexual Activity   Alcohol use: Not Currently    Comment: occ   Drug use: No   Sexual activity: Yes    Partners: Male  Other Topics Concern   Not on file  Social History Narrative   Exercise- walks 4 times a week   Caffeine: 1-2 cups daily   Education: BS textile [employer]   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    Social Drivers of Corporate investment banker Strain: Low Risk  (04/19/2023)   Overall Financial Resource Strain (CARDIA)    Difficulty of Paying Living Expenses: Not very hard  Food Insecurity: No Food Insecurity (04/19/2023)   Hunger Vital Sign    Worried About Running Out of Food in the Last Year: Never true    Ran Out of Food in the Last Year: Never true  Transportation Needs: No Transportation Needs (04/19/2023)   PRAPARE - Administrator, Civil Service (Medical): No    Lack of Transportation (Non-Medical): No  Physical Activity: Insufficiently Active (04/19/2023)   Exercise Vital Sign    Days of Exercise per Week: 2 days    Minutes of Exercise per Session: 20 min  Stress: Stress Concern Present (04/19/2023)   [employer] of Occupational Health - Occupational Stress Questionnaire    Feeling of Stress : To some extent  Social Connections: Moderately Integrated (04/19/2023)   Social Connection  and Isolation Panel [NHANES]    Frequency of Communication with Friends and Family: More than three times a week    Frequency of Social Gatherings with Friends and Family: Once a week    Attends Religious Services: 1 to 4 times per year    Active Member of [employer] or Organizations: No    Attends Engineer, structural: Not on file    Marital Status: Married  Catering manager Violence: Not on file      PHYSICAL EXAM  Vitals:   05/25/23 0813  BP: (!) 145/83  Pulse: 73  Weight: 229 lb (103.9 kg)  Height: 5\' 4"  (1.626 m)    Body mass index is 39.31 kg/m.  Generalized: Well developed, in no acute distress  Chest: Lungs clear to auscultation bilaterally. Hearts sounds normal.  Neurological examination  Mentation: Alert oriented to time, place, history taking. Follows all commands speech and language fluent Cranial nerve II-XII: Extraocular movements were full, visual field were full on confrontational test facial symmetry noted Gait and station: Gait is normal.    DIAGNOSTIC DATA (LABS, IMAGING, TESTING) - I reviewed patient records, labs, notes, testing and imaging myself where available.  Lab Results  Component Value Date   WBC 7.6 01/21/2023   HGB 13.2 01/21/2023   HCT 40.2 01/21/2023   MCV 93.3 01/21/2023   PLT 353.0 01/21/2023      Component Value Date/Time   NA 139 01/21/2023 1337   NA 140 12/25/2021 0810   K 4.3 01/21/2023 1337   CL 101 01/21/2023 1337   CO2 29 01/21/2023 1337   GLUCOSE 94 01/21/2023 1337   BUN 13 01/21/2023 1337   BUN 20 12/25/2021 0810   CREATININE 0.74 01/21/2023 1337   CALCIUM 9.4 01/21/2023 1337   PROT 7.0 01/21/2023 1337   PROT 6.8 12/25/2021 0810   ALBUMIN 4.2 01/21/2023 1337  ALBUMIN 4.3 12/25/2021 0810   AST 18 01/21/2023 1337   ALT 16 01/21/2023 1337   ALKPHOS 95 01/21/2023 1337   BILITOT 0.7 01/21/2023 1337   BILITOT 0.3 12/25/2021 0810   GFRNONAA 87 11/16/2019 1252   GFRAA 100 11/16/2019 1252   Lab Results   Component Value Date   CHOL 221 (H) 01/21/2023   HDL 61.30 01/21/2023   LDLCALC 127 (H) 01/21/2023   TRIG 161.0 (H) 01/21/2023   CHOLHDL 4 01/21/2023   Lab Results  Component Value Date   HGBA1C 5.2 12/25/2021   Lab Results  Component Value Date   VITAMINB12 536 02/02/2019   Lab Results  Component Value Date   TSH 1.27 01/21/2023      ASSESSMENT AND PLAN 58 y.o. year old female  has a past medical history of Anxiety, Back pain, Depression, Fatigue, GERD (gastroesophageal reflux disease), and Lactose intolerance. here with:  OSA on CPAP   - CPAP compliance suboptimal - Good treatment of AHI  - Encourage patient to use CPAP nightly and > 4 hours each night - F/U in 1 year or sooner if needed   Butch Penny, MSN, NP-C 05/25/2023, 8:16 AM [facility] [address] [place], [place] [address] [phone]

## 2023-05-25 NOTE — Patient Instructions (Signed)
 Continue using CPAP nightly and greater than 4 hours each night If your symptoms worsen or you develop new symptoms please let us know.

## 2023-05-26 ENCOUNTER — Encounter: Payer: Self-pay | Admitting: Family Medicine

## 2023-05-31 ENCOUNTER — Ambulatory Visit
Admission: RE | Admit: 2023-05-31 | Discharge: 2023-05-31 | Disposition: A | Discharge status: Home / Self Care | Payer: No Typology Code available for payment source | Source: Ambulatory Visit

## 2023-05-31 DIAGNOSIS — Z1231 Encounter for screening mammogram for malignant neoplasm of breast: Secondary | ICD-10-CM

## 2023-06-02 ENCOUNTER — Ambulatory Visit: Payer: No Typology Code available for payment source | Admitting: Family Medicine

## 2023-07-13 ENCOUNTER — Encounter: Payer: Self-pay | Admitting: Family Medicine

## 2023-07-13 ENCOUNTER — Ambulatory Visit: Admitting: Family Medicine

## 2023-07-13 VITALS — BP 118/80 | HR 96 | Temp 98.8°F | Resp 18 | Ht 64.0 in | Wt 224.8 lb

## 2023-07-13 DIAGNOSIS — J0140 Acute pansinusitis, unspecified: Secondary | ICD-10-CM | POA: Diagnosis not present

## 2023-07-13 MED ORDER — AMOXICILLIN-POT CLAVULANATE 875-125 MG PO TABS
1.0000 | ORAL_TABLET | Freq: Two times a day (BID) | ORAL | 0 refills | Status: DC
Start: 2023-07-13 — End: 2023-12-24

## 2023-07-13 MED ORDER — FLUTICASONE PROPIONATE 50 MCG/ACT NA SUSP
2.0000 | Freq: Every day | NASAL | 6 refills | Status: AC
Start: 2023-07-13 — End: ?

## 2023-07-13 NOTE — Progress Notes (Signed)
 Established Patient Office Visit  Subjective   Patient ID: Theresa Mathews, female    DOB: [birth year]  Age: 58 y.o. MRN: [id number]  Chief Complaint  Patient presents with   Cough    Sxs started last week, pt states having productive cough, tickle in throat, congestion, Pt taking Mucinex otc and flonase,     HPI  History of Present Illness SKYLAN Mathews is a 58 year old female who presents with symptoms of an upper respiratory infection.  She initially experienced symptoms resembling allergies, such as itchy, watery eyes, which progressed to a sinus headache, post-nasal drip, cough, and fever. The fever peaked at 101F and was managed with Mucinex containing Tylenol, which effectively controlled the fever until the medication wore off.  She feels drained, typical of having a fever, but denies significant body aches. Congestion is primarily in the form of post-nasal drip rather than nasal blockage, and she has a mild sore throat. No recent exposure to anyone who is sick.  The symptoms began with sneezing last Tuesday and Wednesday, with the fever starting on Saturday. She has not performed any home tests for COVID-19 or flu, noting that this illness has been a gradual progression unlike previous flu experiences.  She reports ear pressure and mild swelling of the glands. Sinus pressure was initially present but resolved once drainage began. She uses Flonase nasal spray, which she had from a previous sinus infection last year, and has not started any antihistamines like Zyrtec or Claritin.     Patient Active Problem List   Diagnosis Date Noted   Acute cough 03/04/2023   Acute non-recurrent pansinusitis 03/04/2023   Plantar fasciitis 07/09/2022   Eating disorder 12/25/2021   Polyphagia 11/24/2021   OSA on CPAP 04/01/2021   Preventative health care 09/16/2020   Bilateral chronic knee pain 02/29/2020   Depression 10/25/2019   Other hyperlipidemia 09/22/2018   Exposure to  COVID-19 virus 08/16/2018   Diarrhea 08/16/2018   Fever and chills 08/16/2018   Vitamin D deficiency 04/28/2018   Insulin resistance 04/14/2018   Essential hypertension 03/15/2018   Class 2 severe obesity with serious comorbidity and body mass index (BMI) of 37.0 to 37.9 in adult [facility]) 01/18/2018   Other fatigue 08/26/2017   Shortness of breath on exertion 08/26/2017   Gastroesophageal reflux disease without esophagitis 08/26/2017   Depression with anxiety 04/12/2017   Morbid obesity with body mass index of 40.0-44.9 in adult [facility]) 04/12/2017   Postoperative state 01/23/2015   Low back pain 12/01/2012   Past Medical History:  Diagnosis Date   Anxiety    Back pain    Depression    Fatigue    GERD (gastroesophageal reflux disease)    Lactose intolerance    Past Surgical History:  Procedure Laterality Date   BLADDER SUSPENSION N/A 01/23/2015   Procedure: TRANSVAGINAL TAPE (TVT) PROCEDURE;  Surgeon: Carrington Clamp, MD;  Location: [facility];  Service: Gynecology;  Laterality: N/A;   CYSTOSCOPY N/A 01/23/2015   Procedure: CYSTOSCOPY;  Surgeon: Carrington Clamp, MD;  Location: [facility];  Service: Gynecology;  Laterality: N/A;   DILATION AND CURETTAGE OF UTERUS  2005   ROBOTIC ASSISTED TOTAL HYSTERECTOMY WITH BILATERAL SALPINGO OOPHERECTOMY Bilateral 01/23/2015   Procedure: ROBOTIC ASSISTED TOTAL HYSTERECTOMY WITH BILATERAL SALPINGO OOPHORECTOMY;  Surgeon: Carrington Clamp, MD;  Location: [facility];  Service: Gynecology;  Laterality: Bilateral;   Social History   Tobacco Use   Smoking status: Former    Current packs/day: 0.00  Average packs/day: 0.5 packs/day for 4.0 years (2.0 ttl pk-yrs)    Types: Cigarettes    Start date: 89    Quit date: 66    Years since quitting: 31.3   Smokeless tobacco: Never   Tobacco comments:    smoked in college  Vaping Use   Vaping status: Never Used  Substance Use Topics   Alcohol use: Not Currently    Comment: occ   Drug use: No   Social  History   Socioeconomic History   Marital status: Married    Spouse name: Zania Kalisz   Number of children: 2   Years of education: Not on file   Highest education level: Bachelor's degree (e.g., BA, AB, BS)  Occupational History   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    Comment: [employer]  Tobacco Use   Smoking status: Former    Current packs/day: 0.00    Average packs/day: 0.5 packs/day for 4.0 years (2.0 ttl pk-yrs)    Types: Cigarettes    Start date: 73    Quit date: 79    Years since quitting: 31.3   Smokeless tobacco: Never   Tobacco comments:    smoked in college  Vaping Use   Vaping status: Never Used  Substance and Sexual Activity   Alcohol use: Not Currently    Comment: occ   Drug use: No   Sexual activity: Yes    Partners: Male  Other Topics Concern   Not on file  Social History Narrative   Exercise- walks 4 times a week   Caffeine: 1-2 cups daily   Education: BS textile [place]   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    Social Drivers of Corporate investment banker Strain: Low Risk  (04/19/2023)   Overall Financial Resource Strain (CARDIA)    Difficulty of Paying Living Expenses: Not very hard  Food Insecurity: No Food Insecurity (04/19/2023)   Hunger Vital Sign    Worried About Running Out of Food in the Last Year: Never true    Ran Out of Food in the Last Year: Never true  Transportation Needs: No Transportation Needs (04/19/2023)   PRAPARE - Administrator, Civil Service (Medical): No    Lack of Transportation (Non-Medical): No  Physical Activity: Insufficiently Active (04/19/2023)   Exercise Vital Sign    Days of Exercise per Week: 2 days    Minutes of Exercise per Session: 20 min  Stress: Stress Concern Present (04/19/2023)   [employer] of Occupational Health - Occupational Stress Questionnaire    Feeling of Stress : To some extent  Social Connections: Moderately Integrated (04/19/2023)   Social Connection and Isolation Panel  [NHANES]    Frequency of Communication with Friends and Family: More than three times a week    Frequency of Social Gatherings with Friends and Family: Once a week    Attends Religious Services: 1 to 4 times per year    Active Member of [employer] or Organizations: No    Attends Engineer, structural: Not on file    Marital Status: Married  Catering manager Violence: Not on file   Family Status  Relation Name Status   Mother  Alive   Father  Alive   Brother  Alive   PGF  (Not Specified)   Neg Hx  (Not Specified)  No partnership data on file   Family History  Problem Relation Age of Onset   Hypertension Mother    Ovarian cancer Mother  Heart disease Mother 82       a fib   Obesity Mother    Heart failure Mother    Hypertension Father    Diabetes Father    Hyperlipidemia Father    Obesity Father    Asthma Paternal Grandfather    Sleep apnea Neg Hx    No Known Allergies    Review of Systems  Constitutional:  Negative for fever and malaise/fatigue.  HENT:  Positive for congestion, sinus pain and sore throat.   Eyes:  Negative for blurred vision.  Respiratory:  Negative for cough and shortness of breath.   Cardiovascular:  Negative for chest pain, palpitations and leg swelling.  Gastrointestinal:  Negative for vomiting.  Musculoskeletal:  Negative for back pain.  Skin:  Negative for rash.  Neurological:  Negative for loss of consciousness and headaches.      Objective:     BP 118/80 (BP Location: Left Arm, Patient Position: Sitting)   Pulse 96   Temp 98.8 F (37.1 C) (Oral)   Resp 18   Ht 5\' 4"  (1.626 m)   Wt 224 lb 12.8 oz (102 kg)   SpO2 93%   BMI 38.59 kg/m  BP Readings from Last 3 Encounters:  07/13/23 118/80  05/25/23 (!) 145/83  04/20/23 128/70   Wt Readings from Last 3 Encounters:  07/13/23 224 lb 12.8 oz (102 kg)  05/25/23 229 lb (103.9 kg)  04/20/23 229 lb 12.8 oz (104.2 kg)   SpO2 Readings from Last 3 Encounters:  07/13/23 93%   04/20/23 99%  03/04/23 96%      Physical Exam Vitals and nursing note reviewed.  Constitutional:      General: She is not in acute distress.    Appearance: Normal appearance. She is well-developed.  HENT:     Head: Normocephalic and atraumatic.     Nose:     Right Sinus: Maxillary sinus tenderness and frontal sinus tenderness present.     Left Sinus: Maxillary sinus tenderness and frontal sinus tenderness present.  Eyes:     General: No scleral icterus.       Right eye: No discharge.        Left eye: No discharge.  Cardiovascular:     Rate and Rhythm: Normal rate and regular rhythm.     Heart sounds: No murmur heard. Pulmonary:     Effort: Pulmonary effort is normal. No respiratory distress.     Breath sounds: Normal breath sounds.  Musculoskeletal:        General: Normal range of motion.     Cervical back: Normal range of motion and neck supple.     Right lower leg: No edema.     Left lower leg: No edema.  Skin:    General: Skin is warm and dry.  Neurological:     Mental Status: She is alert and oriented to person, place, and time.  Psychiatric:        Mood and Affect: Mood normal.        Behavior: Behavior normal.        Thought Content: Thought content normal.        Judgment: Judgment normal.    Strep and covid---  negative  No results found for any visits on 07/13/23.  Last CBC Lab Results  Component Value Date   WBC 7.6 01/21/2023   HGB 13.2 01/21/2023   HCT 40.2 01/21/2023   MCV 93.3 01/21/2023   MCH 31.0 01/08/2015   RDW 13.6 01/21/2023  PLT 353.0 01/21/2023   Last metabolic panel Lab Results  Component Value Date   GLUCOSE 94 01/21/2023   NA 139 01/21/2023   K 4.3 01/21/2023   CL 101 01/21/2023   CO2 29 01/21/2023   BUN 13 01/21/2023   CREATININE 0.74 01/21/2023   GFR 90.23 01/21/2023   CALCIUM 9.4 01/21/2023   PROT 7.0 01/21/2023   ALBUMIN 4.2 01/21/2023   LABGLOB 2.5 12/25/2021   AGRATIO 1.7 12/25/2021   BILITOT 0.7 01/21/2023    ALKPHOS 95 01/21/2023   AST 18 01/21/2023   ALT 16 01/21/2023   Last lipids Lab Results  Component Value Date   CHOL 221 (H) 01/21/2023   HDL 61.30 01/21/2023   LDLCALC 127 (H) 01/21/2023   TRIG 161.0 (H) 01/21/2023   CHOLHDL 4 01/21/2023   Last hemoglobin A1c Lab Results  Component Value Date   HGBA1C 5.2 12/25/2021   Last thyroid functions Lab Results  Component Value Date   TSH 1.27 01/21/2023   T3TOTAL 111 08/26/2017   Last vitamin D Lab Results  Component Value Date   VD25OH 32.81 01/21/2023   Last vitamin B12 and Folate Lab Results  Component Value Date   VITAMINB12 536 02/02/2019   FOLATE 13.2 08/26/2017      The 10-year ASCVD risk score (Arnett DK, et al., 2019) is: 2.1%    Assessment & Plan:   Problem List Items Addressed This Visit       Unprioritized   Acute non-recurrent pansinusitis - Primary   Relevant Medications   fluticasone (FLONASE) 50 MCG/ACT nasal spray   amoxicillin-clavulanate (AUGMENTIN) 875-125 MG tablet  Assessment and Plan Assessment & Plan Upper Respiratory Infection   Symptoms align with an upper respiratory infection, including itchy eyes, sinus headache, post-nasal drip, cough, and fever up to 101F. Symptoms began with sneezing and progressed to fever three days ago. She reports ear pressure and a mild sore throat. The differential diagnosis includes a viral infection, considering COVID-19 and influenza, though influenza is less likely due to the gradual onset. No significant myalgia, but fatigue is present due to fever. No known exposure to sick contacts. Regular antihistamines like Zyrtec or Claritin are recommended to dry up drainage, avoiding those with 'D' due to potential palpitations and cardiac issues. Flonase nasal spray and antihistamines together can mimic the effect of 'D' without associated risks. Swab throat for COVID-19 test. Continue using Flonase nasal spray. Start regular antihistamine like Zyrtec or Claritin.  Avoid antihistamines with 'D'.    No follow-ups on file.    Bobie Kistler R Lowne Chase, DO

## 2023-10-19 ENCOUNTER — Ambulatory Visit: Payer: No Typology Code available for payment source | Admitting: Family Medicine

## 2023-12-24 ENCOUNTER — Ambulatory Visit: Admitting: Family Medicine

## 2023-12-24 ENCOUNTER — Other Ambulatory Visit: Payer: Self-pay | Admitting: Family Medicine

## 2023-12-24 VITALS — BP 122/82 | HR 80 | Temp 98.1°F | Resp 16 | Ht 64.0 in | Wt 246.0 lb

## 2023-12-24 DIAGNOSIS — M25512 Pain in left shoulder: Secondary | ICD-10-CM | POA: Diagnosis not present

## 2023-12-24 DIAGNOSIS — G4733 Obstructive sleep apnea (adult) (pediatric): Secondary | ICD-10-CM | POA: Diagnosis not present

## 2023-12-24 MED ORDER — TIRZEPATIDE-WEIGHT MANAGEMENT 2.5 MG/0.5ML ~~LOC~~ SOLN: 2.5000 mg | SUBCUTANEOUS | 0 refills | Status: DC

## 2023-12-24 MED ORDER — CYCLOBENZAPRINE HCL 10 MG PO TABS: 10.0000 mg | ORAL_TABLET | Freq: Three times a day (TID) | ORAL | 0 refills | Status: AC | PRN

## 2023-12-24 MED ORDER — PREDNISONE 10 MG PO TABS: ORAL_TABLET | ORAL | 0 refills | Status: DC

## 2023-12-24 NOTE — Progress Notes (Signed)
 Subjective:    Patient ID: Theresa Mathews, female    DOB: 1965/07/01, 58 y.o.   MRN: [id number]  Chief Complaint  Patient presents with   Shoulder Pain    X3 weeks ago, left shoulder and arm pain. Pt states went to yoga this week and that pain came back.     HPI Patient is in today for shoulder pain.    Discussed the use of AI scribe software for clinical note transcription with the patient, who gave verbal consent to proceed.  History of Present Illness Theresa Mathews is a 58 year old female who presents with shoulder pain radiating down her arm.  She has been experiencing shoulder pain radiating down her arm for approximately three weeks. Initially, the pain was noticeable when raising her arm to the side. The pain worsened during a family vacation while playing with her grandson, making it difficult to move her arm out to the side. Upon returning home, the pain subsided slightly, with only occasional twinges. However, after attending a yoga class on Tuesday, the pain returned by Wednesday and Thursday, reaching a point where it was difficult to move her arm without discomfort.  She reports no falls or specific incidents of lifting heavy objects, although she frequently assists her disabled husband, which may involve lifting. She describes the pain as different from typical muscle soreness experienced after yoga. Currently, she can raise her arm without the shooting pain she previously experienced, although there is still some soreness. Taking ibuprofen  regularly has helped alleviate the pain, particularly last week, but the pain tends to return in the morning upon waking.  She occasionally experiences numbness in her hands and sometimes sleeps on the affected arm, although she primarily sleeps on her back due to using a CPAP machine for sleep apnea. Her father has had shoulder issues, including a shoulder replacement.   Past Medical History:  Diagnosis Date   Anxiety    Back pain     Depression    Fatigue    GERD (gastroesophageal reflux disease)    Lactose intolerance     Past Surgical History:  Procedure Laterality Date   BLADDER SUSPENSION N/A 01/23/2015   Procedure: TRANSVAGINAL TAPE (TVT) PROCEDURE;  Surgeon: Rosaline Luna, MD;  Location: [facility];  Service: Gynecology;  Laterality: N/A;   CYSTOSCOPY N/A 01/23/2015   Procedure: CYSTOSCOPY;  Surgeon: Rosaline Luna, MD;  Location: [facility];  Service: Gynecology;  Laterality: N/A;   DILATION AND CURETTAGE OF UTERUS  2005   ROBOTIC ASSISTED TOTAL HYSTERECTOMY WITH BILATERAL SALPINGO OOPHERECTOMY Bilateral 01/23/2015   Procedure: ROBOTIC ASSISTED TOTAL HYSTERECTOMY WITH BILATERAL SALPINGO OOPHORECTOMY;  Surgeon: Rosaline Luna, MD;  Location: [facility];  Service: Gynecology;  Laterality: Bilateral;    Family History  Problem Relation Age of Onset   Hypertension Mother    Ovarian cancer Mother    Heart disease Mother 46       a fib   Obesity Mother    Heart failure Mother    Hypertension Father    Diabetes Father    Hyperlipidemia Father    Obesity Father    Asthma Paternal Grandfather    Sleep apnea Neg Hx     Social History   Socioeconomic History   Marital status: Married    Spouse name: Kalasia Crafton   Number of children: 2   Years of education: Not on file   Highest education level: Bachelor's degree (e.g., BA, AB, BS)  Occupational History   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    Comment: [employer]  Tobacco Use   Smoking status: Former    Current packs/day: 0.00    Average packs/day: 0.5 packs/day for 4.0 years (2.0 ttl pk-yrs)    Types: Cigarettes    Start date: 77    Quit date: 62    Years since quitting: 31.7   Smokeless tobacco: Never   Tobacco comments:    smoked in college  Vaping Use   Vaping status: Never Used  Substance and Sexual Activity   Alcohol use: Not Currently    Comment: occ   Drug use: No   Sexual activity: Yes    Partners: Male  Other Topics Concern    Not on file  Social History Narrative   Exercise- walks 4 times a week   Caffeine: 1-2 cups daily   Education: BS textile [place]   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    Social Drivers of Corporate investment banker Strain: Low Risk  (12/24/2023)   Overall Financial Resource Strain (CARDIA)    Difficulty of Paying Living Expenses: Not very hard  Food Insecurity: No Food Insecurity (12/24/2023)   Hunger Vital Sign    Worried About Running Out of Food in the Last Year: Never true    Ran Out of Food in the Last Year: Never true  Transportation Needs: No Transportation Needs (12/24/2023)   PRAPARE - Administrator, Civil Service (Medical): No    Lack of Transportation (Non-Medical): No  Physical Activity: Insufficiently Active (12/24/2023)   Exercise Vital Sign    Days of Exercise per Week: 2 days    Minutes of Exercise per Session: 30 min  Stress: No Stress Concern Present (12/24/2023)   [employer] of Occupational Health - Occupational Stress Questionnaire    Feeling of Stress: Only a little  Social Connections: Socially Integrated (12/24/2023)   Social Connection and Isolation Panel    Frequency of Communication with Friends and Family: More than three times a week    Frequency of Social Gatherings with Friends and Family: More than three times a week    Attends Religious Services: More than 4 times per year    Active Member of [employer] or Organizations: Yes    Attends Banker Meetings: 1 to 4 times per year    Marital Status: Married  Catering manager Violence: Not on file    Outpatient Medications Prior to Visit  Medication Sig Dispense Refill   famotidine  (ZANTAC 360) 10 MG tablet Take 1 tablet (10 mg total) by mouth 2 (two) times daily.     fluticasone  (FLONASE ) 50 MCG/ACT nasal spray Place 2 sprays into both nostrils daily. 16 g 6   amoxicillin -clavulanate (AUGMENTIN ) 875-125 MG tablet Take 1 tablet by mouth 2 (two) times daily. 20 tablet 0    No facility-administered medications prior to visit.    No Known Allergies  Review of Systems  Constitutional:  Negative for fever and malaise/fatigue.  HENT:  Negative for congestion.   Eyes:  Negative for blurred vision.  Respiratory:  Negative for shortness of breath.   Cardiovascular:  Negative for chest pain, palpitations and leg swelling.  Gastrointestinal:  Negative for abdominal pain, blood in stool and nausea.  Genitourinary:  Negative for dysuria and frequency.  Musculoskeletal:  Positive for joint pain. Negative for falls.  Skin:  Negative for rash.  Neurological:  Negative for dizziness, loss of consciousness and headaches.  Endo/Heme/Allergies:  Negative for environmental allergies.  Psychiatric/Behavioral:  Negative for depression. The patient is not nervous/anxious.        Objective:    Physical Exam Vitals and nursing note reviewed.  Constitutional:      General: She is not in acute distress.    Appearance: Normal appearance. She is well-developed.  HENT:     Head: Normocephalic and atraumatic.  Eyes:     General: No scleral icterus.       Right eye: No discharge.        Left eye: No discharge.  Cardiovascular:     Rate and Rhythm: Normal rate and regular rhythm.     Heart sounds: No murmur heard. Pulmonary:     Effort: Pulmonary effort is normal. No respiratory distress.     Breath sounds: Normal breath sounds.  Musculoskeletal:        General: Tenderness present. Normal range of motion.     Right shoulder: Normal.     Left shoulder: Tenderness present. Normal range of motion. Normal strength.       Arms:     Cervical back: Normal range of motion and neck supple.     Right lower leg: No edema.     Left lower leg: No edema.  Skin:    General: Skin is warm and dry.  Neurological:     Mental Status: She is alert and oriented to person, place, and time.  Psychiatric:        Mood and Affect: Mood normal.        Behavior: Behavior normal.         Thought Content: Thought content normal.        Judgment: Judgment normal.     BP 122/82 (BP Location: Right Arm, Patient Position: Sitting, Cuff Size: Large)   Pulse 80   Temp 98.1 F (36.7 C) (Oral)   Resp 16   Ht 5' 4 (1.626 m)   Wt 246 lb (111.6 kg)   SpO2 100%   BMI 42.23 kg/m  Wt Readings from Last 3 Encounters:  12/24/23 246 lb (111.6 kg)  07/13/23 224 lb 12.8 oz (102 kg)  05/25/23 229 lb (103.9 kg)    Diabetic Foot Exam - Simple   No data filed    Lab Results  Component Value Date   WBC 7.6 01/21/2023   HGB 13.2 01/21/2023   HCT 40.2 01/21/2023   PLT 353.0 01/21/2023   GLUCOSE 94 01/21/2023   CHOL 221 (H) 01/21/2023   TRIG 161.0 (H) 01/21/2023   HDL 61.30 01/21/2023   LDLCALC 127 (H) 01/21/2023   ALT 16 01/21/2023   AST 18 01/21/2023   NA 139 01/21/2023   K 4.3 01/21/2023   CL 101 01/21/2023   CREATININE 0.74 01/21/2023   BUN 13 01/21/2023   CO2 29 01/21/2023   TSH 1.27 01/21/2023   HGBA1C 5.2 12/25/2021    Lab Results  Component Value Date   TSH 1.27 01/21/2023   Lab Results  Component Value Date   WBC 7.6 01/21/2023   HGB 13.2 01/21/2023   HCT 40.2 01/21/2023   MCV 93.3 01/21/2023   PLT 353.0 01/21/2023   Lab Results  Component Value Date   NA 139 01/21/2023   K 4.3 01/21/2023   CO2 29 01/21/2023   GLUCOSE 94 01/21/2023   BUN 13 01/21/2023   CREATININE 0.74 01/21/2023   BILITOT 0.7 01/21/2023   ALKPHOS 95 01/21/2023   AST 18 01/21/2023   ALT 16 01/21/2023   PROT 7.0 01/21/2023  ALBUMIN 4.2 01/21/2023   CALCIUM 9.4 01/21/2023   EGFR 86 12/25/2021   GFR 90.23 01/21/2023   Lab Results  Component Value Date   CHOL 221 (H) 01/21/2023   Lab Results  Component Value Date   HDL 61.30 01/21/2023   Lab Results  Component Value Date   LDLCALC 127 (H) 01/21/2023   Lab Results  Component Value Date   TRIG 161.0 (H) 01/21/2023   Lab Results  Component Value Date   CHOLHDL 4 01/21/2023   Lab Results  Component Value Date    HGBA1C 5.2 12/25/2021       Assessment & Plan:  Acute pain of left shoulder -     predniSONE ; TAKE 3 TABLETS PO QD FOR 3 DAYS THEN TAKE 2 TABLETS PO QD FOR 3 DAYS THEN TAKE 1 TABLET PO QD FOR 3 DAYS THEN TAKE 1/2 TAB PO QD FOR 3 DAYS  Dispense: 20 tablet; Refill: 0 -     Cyclobenzaprine  HCl; Take 1 tablet (10 mg total) by mouth 3 (three) times daily as needed for muscle spasms.  Dispense: 30 tablet; Refill: 0  OSA (obstructive sleep apnea) -     Tirzepatide -Weight Management; Inject 2.5 mg into the skin once a week.  Dispense: 2 mL; Refill: 0   Assessment and Plan Assessment & Plan Right shoulder and upper arm pain   Intermittent pain for three weeks, worsened by lifting and certain movements, likely muscular. Differential includes muscle strain or partial tear. Pain improves with ibuprofen , no constant pain suggests no full tear. Prescribe prednisone  taper to reduce inflammation and a muscle relaxer for symptomatic relief. Advise deep tissue massage and avoid lifting more than 10 pounds until prednisone  course is complete. Rest shoulder but maintain normal movement to prevent frozen shoulder. Consider referral to orthopedics or sports medicine if pain persists post-prednisone . Advise visiting [facility] urgent care if condition worsens.  Obstructive sleep apnea   Managed with CPAP. Discussed potential insurance coverage for Zepbound  due to new indication for sleep apnea. Submit prior authorization for Zepbound  for weight management in context of sleep apnea. Instruct to monitor [facility] for prescription updates.  Overweight   Recent weight gain of 10 pounds. Interested in weight management options. Discussed Zepbound  (Tirzepatide ) as a potential treatment option, pending insurance approval. Submit prior authorization for Zepbound  for weight management. Schedule follow-up in three months if Zepbound  is approved.   Ayson Cherubini R Lowne Chase, DO

## 2023-12-27 ENCOUNTER — Other Ambulatory Visit: Payer: Self-pay | Admitting: Family Medicine

## 2023-12-27 ENCOUNTER — Other Ambulatory Visit (HOSPITAL_COMMUNITY): Payer: Self-pay

## 2023-12-27 ENCOUNTER — Encounter: Payer: Self-pay | Admitting: Family Medicine

## 2023-12-27 ENCOUNTER — Telehealth: Payer: Self-pay

## 2023-12-27 MED ORDER — ZEPBOUND 2.5 MG/0.5ML ~~LOC~~ SOAJ: 2.5000 mg | SUBCUTANEOUS | 0 refills | Status: AC

## 2023-12-27 NOTE — Telephone Encounter (Signed)
 Pharmacy Patient Advocate Encounter   Received notification from RX Request Messages that prior authorization for Zepbound  2.5mg /0.83ml is required/requested.   Insurance verification completed.   The patient is insured through [employer] .   Per test claim: Per test claim, medication is not covered due to plan/benefit exclusion, PA not submitted at this time

## 2023-12-28 NOTE — Telephone Encounter (Signed)
 PA was not approved and medication is an exclusion. Please advise

## 2023-12-28 NOTE — Telephone Encounter (Signed)
 FYI

## 2024-04-10 ENCOUNTER — Other Ambulatory Visit: Payer: Self-pay | Admitting: Family Medicine

## 2024-04-10 ENCOUNTER — Ambulatory Visit: Admitting: Family Medicine

## 2024-04-10 VITALS — BP 138/78 | HR 62 | Temp 97.7°F | Resp 16 | Ht 64.0 in | Wt 247.0 lb

## 2024-04-10 DIAGNOSIS — Z6837 Body mass index (BMI) 37.0-37.9, adult: Secondary | ICD-10-CM

## 2024-04-10 DIAGNOSIS — G4733 Obstructive sleep apnea (adult) (pediatric): Secondary | ICD-10-CM | POA: Diagnosis not present

## 2024-04-10 DIAGNOSIS — I10 Essential (primary) hypertension: Secondary | ICD-10-CM | POA: Diagnosis not present

## 2024-04-10 DIAGNOSIS — E88819 Insulin resistance, unspecified: Secondary | ICD-10-CM

## 2024-04-10 DIAGNOSIS — E66812 Obesity, class 2: Secondary | ICD-10-CM | POA: Diagnosis not present

## 2024-04-10 DIAGNOSIS — Z0000 Encounter for general adult medical examination without abnormal findings: Secondary | ICD-10-CM | POA: Diagnosis not present

## 2024-04-10 DIAGNOSIS — E7849 Other hyperlipidemia: Secondary | ICD-10-CM

## 2024-04-10 DIAGNOSIS — Z1283 Encounter for screening for malignant neoplasm of skin: Secondary | ICD-10-CM

## 2024-04-10 NOTE — Assessment & Plan Note (Signed)
 Ghm utd Check labs  See AVS Health Maintenance  Topic Date Due   COVID-19 Vaccine (1) Never done   HIV Screening  Never done   Hepatitis B Vaccines 19-59 Average Risk (1 of 3 - 19+ 3-dose series) Never done   Pneumococcal Vaccine: 50+ Years (1 of 1 - PCV) Never done   Mammogram  05/30/2024   Influenza Vaccine  06/27/2024 (Originally 10/29/2023)   DTaP/Tdap/Td (2 - Td or Tdap) 07/13/2027   Colonoscopy  09/07/2027   Hepatitis C Screening  Completed   Zoster Vaccines- Shingrix  Completed   HPV VACCINES  Aged Out   Meningococcal B Vaccine  Aged Out

## 2024-04-10 NOTE — Assessment & Plan Note (Signed)
 Check labs

## 2024-04-10 NOTE — Progress Notes (Signed)
 "  Subjective:    Patient ID: Theresa Mathews, female    DOB: January 14, 1966, 59 y.o.   MRN: [id number]  Chief Complaint  Patient presents with   Annual Exam    Pt states fasting     HPI Patient is in today for cpe.  Discussed the use of AI scribe software for clinical note transcription with the patient, who gave verbal consent to proceed. Pt with no complaints  She would like to discuss weight loss meds.   History of Present Illness     Past Medical History:  Diagnosis Date   Anxiety    Back pain    Depression    Fatigue    GERD (gastroesophageal reflux disease)    Lactose intolerance     Past Surgical History:  Procedure Laterality Date   BLADDER SUSPENSION N/A 01/23/2015   Procedure: TRANSVAGINAL TAPE (TVT) PROCEDURE;  Surgeon: Rosaline Luna, MD;  Location: [facility];  Service: Gynecology;  Laterality: N/A;   CYSTOSCOPY N/A 01/23/2015   Procedure: CYSTOSCOPY;  Surgeon: Rosaline Luna, MD;  Location: [facility];  Service: Gynecology;  Laterality: N/A;   DILATION AND CURETTAGE OF UTERUS  2005   ROBOTIC ASSISTED TOTAL HYSTERECTOMY WITH BILATERAL SALPINGO OOPHERECTOMY Bilateral 01/23/2015   Procedure: ROBOTIC ASSISTED TOTAL HYSTERECTOMY WITH BILATERAL SALPINGO OOPHORECTOMY;  Surgeon: Rosaline Luna, MD;  Location: [facility];  Service: Gynecology;  Laterality: Bilateral;    Family History  Problem Relation Age of Onset   Hypertension Mother    Ovarian cancer Mother    Heart disease Mother 34       a fib   Obesity Mother    Heart failure Mother    Hypertension Father    Diabetes Father    Hyperlipidemia Father    Obesity Father    Asthma Paternal Grandfather    Sleep apnea Neg Hx     Social History   Socioeconomic History   Marital status: Married    Spouse name: Deja Pisarski   Number of children: 2   Years of education: Not on file   Highest education level: Bachelor's degree (e.g., BA, AB, BS)  Occupational History   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    Comment:  [employer]  Tobacco Use   Smoking status: Former    Current packs/day: 0.00    Average packs/day: 0.5 packs/day for 4.0 years (2.0 ttl pk-yrs)    Types: Cigarettes    Start date: 47    Quit date: 68    Years since quitting: 32.0   Smokeless tobacco: Never   Tobacco comments:    smoked in college  Vaping Use   Vaping status: Never Used  Substance and Sexual Activity   Alcohol use: Not Currently    Comment: occ   Drug use: No   Sexual activity: Yes    Partners: Male  Other Topics Concern   Not on file  Social History Narrative   Exercise- walks 4 times a week   Caffeine: 1-2 cups daily   Education: BS textile [employer]   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    Social Drivers of Health   Tobacco Use: Medium Risk (04/10/2024)   Patient History    Smoking Tobacco Use: Former    Smokeless Tobacco Use: Never    Passive Exposure: Not on file  Financial Resource Strain: Low Risk (04/10/2024)   Overall Financial Resource Strain (CARDIA)    Difficulty of Paying Living Expenses: Not very hard  Food Insecurity: No Food Insecurity (04/10/2024)  Epic    Worried About Programme Researcher, Broadcasting/film/video in the Last Year: Never true    [employer] of Food in the Last Year: Never true  Transportation Needs: No Transportation Needs (04/10/2024)   Epic    Lack of Transportation (Medical): No    Lack of Transportation (Non-Medical): No  Physical Activity: Insufficiently Active (04/10/2024)   Exercise Vital Sign    Days of Exercise per Week: 1 day    Minutes of Exercise per Session: 30 min  Stress: No Stress Concern Present (04/10/2024)   [employer] of Occupational Health - Occupational Stress Questionnaire    Feeling of Stress: Only a little  Social Connections: Socially Integrated (04/10/2024)   Social Connection and Isolation Panel    Frequency of Communication with Friends and Family: More than three times a week    Frequency of Social Gatherings with Friends and Family: More than three  times a week    Attends Religious Services: 1 to 4 times per year    Active Member of Clubs or Organizations: Yes    Attends Banker Meetings: 1 to 4 times per year    Marital Status: Married  Catering Manager Violence: Not on file  Depression (PHQ2-9): Low Risk (12/24/2023)   Depression (PHQ2-9)    PHQ-2 Score: 0  Alcohol Screen: Low Risk (04/10/2024)   Alcohol Screen    Last Alcohol Screening Score (AUDIT): 3  Housing: Low Risk (04/10/2024)   Epic    Unable to Pay for Housing in the Last Year: No    Number of Times Moved in the Last Year: 0    Homeless in the Last Year: No  Utilities: Not on file  Health Literacy: Not on file    Outpatient Medications Prior to Visit  Medication Sig Dispense Refill   cyclobenzaprine  (FLEXERIL ) 10 MG tablet Take 1 tablet (10 mg total) by mouth 3 (three) times daily as needed for muscle spasms. 30 tablet 0   famotidine  (ZANTAC 360) 10 MG tablet Take 1 tablet (10 mg total) by mouth 2 (two) times daily.     fluticasone  (FLONASE ) 50 MCG/ACT nasal spray Place 2 sprays into both nostrils daily. 16 g 6   tirzepatide  (ZEPBOUND ) 2.5 MG/0.5ML Pen Inject 2.5 mg into the skin once a week. 2 mL 0   predniSONE  (DELTASONE ) 10 MG tablet TAKE 3 TABLETS PO QD FOR 3 DAYS THEN TAKE 2 TABLETS PO QD FOR 3 DAYS THEN TAKE 1 TABLET PO QD FOR 3 DAYS THEN TAKE 1/2 TAB PO QD FOR 3 DAYS 20 tablet 0   No facility-administered medications prior to visit.    No Known Allergies  Review of Systems  Constitutional:  Negative for fever and malaise/fatigue.  HENT:  Negative for congestion.   Eyes:  Negative for blurred vision.  Respiratory:  Negative for shortness of breath.   Cardiovascular:  Negative for chest pain, palpitations and leg swelling.  Gastrointestinal:  Negative for abdominal pain, blood in stool and nausea.  Genitourinary:  Negative for dysuria and frequency.  Musculoskeletal:  Negative for falls.  Skin:  Negative for rash.  Neurological:  Negative  for dizziness, loss of consciousness and headaches.  Endo/Heme/Allergies:  Negative for environmental allergies.  Psychiatric/Behavioral:  Negative for depression. The patient is not nervous/anxious.        Objective:    Physical Exam Vitals and nursing note reviewed.  Constitutional:      General: She is not in acute distress.    Appearance: Normal  appearance. She is well-developed.  HENT:     Head: Normocephalic and atraumatic.     Right Ear: Tympanic membrane, ear canal and external ear normal. There is no impacted cerumen.     Left Ear: Tympanic membrane, ear canal and external ear normal. There is no impacted cerumen.     Nose: Nose normal.     Mouth/Throat:     Mouth: Mucous membranes are moist.     Pharynx: Oropharynx is clear. No oropharyngeal exudate or posterior oropharyngeal erythema.  Eyes:     General: No scleral icterus.       Right eye: No discharge.        Left eye: No discharge.     Conjunctiva/sclera: Conjunctivae normal.     Pupils: Pupils are equal, round, and reactive to light.  Neck:     Thyroid : No thyromegaly or thyroid  tenderness.     Vascular: No JVD.  Cardiovascular:     Rate and Rhythm: Normal rate and regular rhythm.     Heart sounds: Normal heart sounds. No murmur heard. Pulmonary:     Effort: Pulmonary effort is normal. No respiratory distress.     Breath sounds: Normal breath sounds.  Abdominal:     General: Bowel sounds are normal. There is no distension.     Palpations: Abdomen is soft. There is no mass.     Tenderness: There is no abdominal tenderness. There is no guarding or rebound.  Genitourinary:    Vagina: Normal.  Musculoskeletal:        General: Normal range of motion.     Cervical back: Normal range of motion and neck supple.     Right lower leg: No edema.     Left lower leg: No edema.  Lymphadenopathy:     Cervical: No cervical adenopathy.  Skin:    General: Skin is warm and dry.     Findings: No erythema or rash.   Neurological:     General: No focal deficit present.     Mental Status: She is alert and oriented to person, place, and time.     Cranial Nerves: No cranial nerve deficit.     Deep Tendon Reflexes: Reflexes are normal and symmetric.  Psychiatric:        Mood and Affect: Mood normal.        Behavior: Behavior normal.        Thought Content: Thought content normal.        Judgment: Judgment normal.     BP 138/78 (BP Location: Left Arm, Patient Position: Sitting, Cuff Size: Large)   Pulse 62   Temp 97.7 F (36.5 C) (Oral)   Resp 16   Ht 5' 4 (1.626 m)   Wt 247 lb (112 kg)   SpO2 97%   BMI 42.40 kg/m  Wt Readings from Last 3 Encounters:  04/10/24 247 lb (112 kg)  12/24/23 246 lb (111.6 kg)  07/13/23 224 lb 12.8 oz (102 kg)    Diabetic Foot Exam - Simple   No data filed    Lab Results  Component Value Date   WBC 7.6 01/21/2023   HGB 13.2 01/21/2023   HCT 40.2 01/21/2023   PLT 353.0 01/21/2023   GLUCOSE 94 01/21/2023   CHOL 221 (H) 01/21/2023   TRIG 161.0 (H) 01/21/2023   HDL 61.30 01/21/2023   LDLCALC 127 (H) 01/21/2023   ALT 16 01/21/2023   AST 18 01/21/2023   NA 139 01/21/2023   K 4.3 01/21/2023  CL 101 01/21/2023   CREATININE 0.74 01/21/2023   BUN 13 01/21/2023   CO2 29 01/21/2023   TSH 1.27 01/21/2023   HGBA1C 5.2 12/25/2021    Lab Results  Component Value Date   TSH 1.27 01/21/2023   Lab Results  Component Value Date   WBC 7.6 01/21/2023   HGB 13.2 01/21/2023   HCT 40.2 01/21/2023   MCV 93.3 01/21/2023   PLT 353.0 01/21/2023   Lab Results  Component Value Date   NA 139 01/21/2023   K 4.3 01/21/2023   CO2 29 01/21/2023   GLUCOSE 94 01/21/2023   BUN 13 01/21/2023   CREATININE 0.74 01/21/2023   BILITOT 0.7 01/21/2023   ALKPHOS 95 01/21/2023   AST 18 01/21/2023   ALT 16 01/21/2023   PROT 7.0 01/21/2023   ALBUMIN 4.2 01/21/2023   CALCIUM 9.4 01/21/2023   EGFR 86 12/25/2021   GFR 90.23 01/21/2023   Lab Results  Component Value Date    CHOL 221 (H) 01/21/2023   Lab Results  Component Value Date   HDL 61.30 01/21/2023   Lab Results  Component Value Date   LDLCALC 127 (H) 01/21/2023   Lab Results  Component Value Date   TRIG 161.0 (H) 01/21/2023   Lab Results  Component Value Date   CHOLHDL 4 01/21/2023   Lab Results  Component Value Date   HGBA1C 5.2 12/25/2021       Assessment & Plan:  Preventative health care Assessment & Plan: Ghm utd Check labs  See AVS Health Maintenance  Topic Date Due   COVID-19 Vaccine (1) Never done   HIV Screening  Never done   Hepatitis B Vaccines 19-59 Average Risk (1 of 3 - 19+ 3-dose series) Never done   Pneumococcal Vaccine: 50+ Years (1 of 1 - PCV) Never done   Mammogram  05/30/2024   Influenza Vaccine  06/27/2024 (Originally 10/29/2023)   DTaP/Tdap/Td (2 - Td or Tdap) 07/13/2027   Colonoscopy  09/07/2027   Hepatitis C Screening  Completed   Zoster Vaccines- Shingrix  Completed   HPV VACCINES  Aged Out   Meningococcal B Vaccine  Aged Out     Orders: -     CBC with Differential/Platelet -     Comprehensive metabolic panel with GFR -     Lipid panel -     TSH  Essential hypertension Assessment & Plan: Well controlled, no changes to meds. Encouraged heart healthy diet such as the DASH diet and exercise as tolerated.    Orders: -     Comprehensive metabolic panel with GFR -     Lipid panel -     TSH  Insulin  resistance Assessment & Plan: Check labs   Orders: -     Insulin , random  Other hyperlipidemia Assessment & Plan: Encourage heart healthy diet such as MIND or DASH diet, increase exercise, avoid trans fats, simple carbohydrates and processed foods, consider a krill or fish or flaxseed oil cap daily.    Orders: -     Comprehensive metabolic panel with GFR -     Lipid panel  Skin cancer screening -     Ambulatory referral to Dermatology  OSA (obstructive sleep apnea) -     Ambulatory referral to Neurology  Class 2 severe obesity with  serious comorbidity and body mass index (BMI) of 37.0 to 37.9 in adult, unspecified obesity type Assessment & Plan: Discussed new wegovy  pill Pt will get us  the info so we know where to  send rx   Assessment and Plan Assessment & Plan     Jamee JONELLE Antonio Cyndee, DO  "

## 2024-04-10 NOTE — Assessment & Plan Note (Signed)
 Encourage heart healthy diet such as MIND or DASH diet, increase exercise, avoid trans fats, simple carbohydrates and processed foods, consider a krill or fish or flaxseed oil cap daily.

## 2024-04-10 NOTE — Assessment & Plan Note (Signed)
 Discussed new wegovy  pill Pt will get us  the info so we know where to send rx

## 2024-04-10 NOTE — Assessment & Plan Note (Signed)
 Well controlled, no changes to meds. Encouraged heart healthy diet such as the DASH diet and exercise as tolerated.

## 2024-04-11 ENCOUNTER — Encounter: Payer: Self-pay | Admitting: Family Medicine

## 2024-04-11 DIAGNOSIS — Z6837 Body mass index (BMI) 37.0-37.9, adult: Secondary | ICD-10-CM

## 2024-04-11 LAB — CBC WITH DIFFERENTIAL/PLATELET
Basophils Absolute: 0.1 K/uL (ref 0.0–0.1)
Basophils Relative: 0.8 % (ref 0.0–3.0)
Eosinophils Absolute: 0.3 K/uL (ref 0.0–0.7)
Eosinophils Relative: 4 % (ref 0.0–5.0)
HCT: 38.4 % (ref 36.0–46.0)
Hemoglobin: 13.3 g/dL (ref 12.0–15.0)
Lymphocytes Relative: 21.4 % (ref 12.0–46.0)
Lymphs Abs: 1.9 K/uL (ref 0.7–4.0)
MCHC: 34.6 g/dL (ref 30.0–36.0)
MCV: 91.5 fl (ref 78.0–100.0)
Monocytes Absolute: 0.4 K/uL (ref 0.1–1.0)
Monocytes Relative: 4.8 % (ref 3.0–12.0)
Neutro Abs: 6 K/uL (ref 1.4–7.7)
Neutrophils Relative %: 69 % (ref 43.0–77.0)
Platelets: 344 K/uL (ref 150.0–400.0)
RBC: 4.19 Mil/uL (ref 3.87–5.11)
RDW: 13.1 % (ref 11.5–15.5)
WBC: 8.7 K/uL (ref 4.0–10.5)

## 2024-04-11 LAB — COMPREHENSIVE METABOLIC PANEL WITH GFR
ALT: 13 U/L (ref 3–35)
AST: 14 U/L (ref 5–37)
Albumin: 4.3 g/dL (ref 3.5–5.2)
Alkaline Phosphatase: 90 U/L (ref 39–117)
BUN: 16 mg/dL (ref 6–23)
CO2: 25 meq/L (ref 19–32)
Calcium: 8.8 mg/dL (ref 8.4–10.5)
Chloride: 100 meq/L (ref 96–112)
Creatinine, Ser: 0.7 mg/dL (ref 0.40–1.20)
GFR: 95.63 mL/min
Glucose, Bld: 89 mg/dL (ref 70–99)
Potassium: 3.9 meq/L (ref 3.5–5.1)
Sodium: 137 meq/L (ref 135–145)
Total Bilirubin: 0.5 mg/dL (ref 0.2–1.2)
Total Protein: 7.3 g/dL (ref 6.0–8.3)

## 2024-04-11 LAB — LIPID PANEL
Cholesterol: 225 mg/dL — ABNORMAL HIGH (ref 28–200)
HDL: 60.6 mg/dL
LDL Cholesterol: 138 mg/dL — ABNORMAL HIGH (ref 10–99)
NonHDL: 164.72
Total CHOL/HDL Ratio: 4
Triglycerides: 134 mg/dL (ref 10.0–149.0)
VLDL: 26.8 mg/dL (ref 0.0–40.0)

## 2024-04-11 LAB — TSH: TSH: 1.41 u[IU]/mL (ref 0.35–5.50)

## 2024-04-11 NOTE — Telephone Encounter (Signed)
 From note yesterday:   Class 2 severe obesity with serious comorbidity and body mass index (BMI) of 37.0 to 37.9 in adult, unspecified obesity type Assessment & Plan: Discussed new wegovy  pill Pt will get us  the info so we know where to send rx  Added pharmacy to her chart.

## 2024-04-12 LAB — EXTRA SPECIMEN

## 2024-04-12 LAB — INSULIN, RANDOM

## 2024-04-12 MED ORDER — WEGOVY 1.5 MG PO TABS: 1.5000 mg | ORAL_TABLET | Freq: Every day | ORAL | 0 refills | Status: AC

## 2024-04-13 ENCOUNTER — Other Ambulatory Visit

## 2024-04-13 ENCOUNTER — Telehealth: Payer: Self-pay | Admitting: *Deleted

## 2024-04-13 NOTE — Telephone Encounter (Signed)
 Lab test for insulin  was canceled due to specimen was received at room temp.  Pt notified and she will be in tomorrow for lab draw tomorrow morning before she goes out of town.

## 2024-04-14 ENCOUNTER — Other Ambulatory Visit

## 2024-04-14 DIAGNOSIS — E88819 Insulin resistance, unspecified: Secondary | ICD-10-CM

## 2024-04-16 ENCOUNTER — Ambulatory Visit: Payer: Self-pay | Admitting: Family Medicine

## 2024-04-17 LAB — INSULIN, RANDOM: Insulin: 15.3 u[IU]/mL

## 2024-05-24 ENCOUNTER — Ambulatory Visit: Payer: No Typology Code available for payment source | Admitting: Adult Health
